# Patient Record
Sex: Female | Born: 1995 | Race: White | Hispanic: No | Smoking: Former smoker
Health system: Southern US, Community
[De-identification: ages and names within clinical notes are randomized; demographics above are authoritative.]

## PROBLEM LIST (undated history)

## (undated) ENCOUNTER — Inpatient Hospital Stay (HOSPITAL_COMMUNITY): Payer: Self-pay

## (undated) DIAGNOSIS — F329 Major depressive disorder, single episode, unspecified: Secondary | ICD-10-CM

## (undated) DIAGNOSIS — F419 Anxiety disorder, unspecified: Secondary | ICD-10-CM

## (undated) DIAGNOSIS — F32A Depression, unspecified: Secondary | ICD-10-CM

## (undated) DIAGNOSIS — F4310 Post-traumatic stress disorder, unspecified: Secondary | ICD-10-CM

## (undated) DIAGNOSIS — R87629 Unspecified abnormal cytological findings in specimens from vagina: Secondary | ICD-10-CM

## (undated) DIAGNOSIS — D649 Anemia, unspecified: Secondary | ICD-10-CM

## (undated) DIAGNOSIS — R519 Headache, unspecified: Secondary | ICD-10-CM

## (undated) DIAGNOSIS — B999 Unspecified infectious disease: Secondary | ICD-10-CM

## (undated) HISTORY — PX: DRUG INDUCED ENDOSCOPY: SHX6808

---
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
## 2018-05-02 DIAGNOSIS — Z3046 Encounter for surveillance of implantable subdermal contraceptive: Secondary | ICD-10-CM | POA: Diagnosis not present

## 2018-09-02 ENCOUNTER — Emergency Department (HOSPITAL_COMMUNITY)
Admission: EM | Admit: 2018-09-02 | Discharge: 2018-09-02 | Disposition: A | Discharge status: Home / Self Care | Payer: Self-pay | Attending: Emergency Medicine | Admitting: Emergency Medicine

## 2018-09-02 ENCOUNTER — Encounter (HOSPITAL_COMMUNITY): Payer: Self-pay | Admitting: *Deleted

## 2018-09-02 DIAGNOSIS — R0981 Nasal congestion: Secondary | ICD-10-CM | POA: Insufficient documentation

## 2018-09-02 DIAGNOSIS — N12 Tubulo-interstitial nephritis, not specified as acute or chronic: Secondary | ICD-10-CM | POA: Insufficient documentation

## 2018-09-02 DIAGNOSIS — L509 Urticaria, unspecified: Secondary | ICD-10-CM | POA: Insufficient documentation

## 2018-09-02 DIAGNOSIS — Z91040 Latex allergy status: Secondary | ICD-10-CM | POA: Insufficient documentation

## 2018-09-02 DIAGNOSIS — R42 Dizziness and giddiness: Secondary | ICD-10-CM | POA: Insufficient documentation

## 2018-09-02 DIAGNOSIS — N912 Amenorrhea, unspecified: Secondary | ICD-10-CM | POA: Insufficient documentation

## 2018-09-02 DIAGNOSIS — J029 Acute pharyngitis, unspecified: Secondary | ICD-10-CM | POA: Insufficient documentation

## 2018-09-02 DIAGNOSIS — R05 Cough: Secondary | ICD-10-CM | POA: Insufficient documentation

## 2018-09-02 LAB — URINALYSIS, ROUTINE W REFLEX MICROSCOPIC
Bilirubin Urine: NEGATIVE
Glucose, UA: NEGATIVE mg/dL
Ketones, ur: 80 mg/dL — AB
Nitrite: POSITIVE — AB
Protein, ur: 30 mg/dL — AB
Specific Gravity, Urine: 1.017 (ref 1.005–1.030)
WBC, UA: >50 WBC/hpf — ABNORMAL HIGH (ref 0–5)
pH: 5 (ref 5.0–8.0)

## 2018-09-02 LAB — COMPREHENSIVE METABOLIC PANEL
ALT: 18 U/L (ref 0–44)
AST: 19 U/L (ref 15–41)
Albumin: 3.5 g/dL (ref 3.5–5.0)
Alkaline Phosphatase: 46 U/L (ref 38–126)
Anion gap: 13 (ref 5–15)
BUN: 11 mg/dL (ref 6–20)
CALCIUM: 8.6 mg/dL — AB (ref 8.9–10.3)
CO2: 23 mmol/L (ref 22–32)
Chloride: 98 mmol/L (ref 98–111)
Creatinine, Ser: 0.93 mg/dL (ref 0.44–1.00)
GFR calc Af Amer: >60 mL/min (ref >60)
Glucose, Bld: 98 mg/dL (ref 70–99)
Potassium: 3.4 mmol/L — ABNORMAL LOW (ref 3.5–5.1)
Sodium: 134 mmol/L — ABNORMAL LOW (ref 135–145)
Total Bilirubin: 0.5 mg/dL (ref 0.3–1.2)
Total Protein: 7.2 g/dL (ref 6.5–8.1)

## 2018-09-02 LAB — CBC WITH DIFFERENTIAL/PLATELET
Abs Immature Granulocytes: 0.06 10*3/uL (ref 0.00–0.07)
Basophils Absolute: 0 10*3/uL (ref 0.0–0.1)
Basophils Relative: 0 %
Eosinophils Absolute: 0 10*3/uL (ref 0.0–0.5)
Eosinophils Relative: 0 %
HEMATOCRIT: 40.8 % (ref 36.0–46.0)
HEMOGLOBIN: 13.1 g/dL (ref 12.0–15.0)
Immature Granulocytes: 1 %
LYMPHS ABS: 0.8 10*3/uL (ref 0.7–4.0)
LYMPHS PCT: 8 %
MCH: 27.1 pg (ref 26.0–34.0)
MCHC: 32.1 g/dL (ref 30.0–36.0)
MCV: 84.3 fL (ref 80.0–100.0)
MONOS PCT: 13 %
Monocytes Absolute: 1.3 10*3/uL — ABNORMAL HIGH (ref 0.1–1.0)
Neutro Abs: 7.9 10*3/uL — ABNORMAL HIGH (ref 1.7–7.7)
Neutrophils Relative %: 78 %
Platelets: 223 10*3/uL (ref 150–400)
RBC: 4.84 MIL/uL (ref 3.87–5.11)
RDW: 13.4 % (ref 11.5–15.5)
WBC: 10.1 10*3/uL (ref 4.0–10.5)
nRBC: 0 % (ref 0.0–0.2)

## 2018-09-02 LAB — GROUP A STREP BY PCR: Group A Strep by PCR: NOT DETECTED

## 2018-09-02 LAB — I-STAT BETA HCG BLOOD, ED (MC, WL, AP ONLY): I-stat hCG, quantitative: <5 m[IU]/mL (ref <5)

## 2018-09-02 MED ORDER — ONDANSETRON HCL 4 MG/2ML IJ SOLN
4.0000 mg | Freq: Once | INTRAMUSCULAR | Status: AC
  Administered 2018-09-02: 4 mg via INTRAVENOUS
  Filled 2018-09-02: qty 2

## 2018-09-02 MED ORDER — ACETAMINOPHEN 325 MG PO TABS
650.0000 mg | ORAL_TABLET | Freq: Once | ORAL | Status: AC
  Administered 2018-09-02: 650 mg via ORAL
  Filled 2018-09-02: qty 2

## 2018-09-02 MED ORDER — CIPROFLOXACIN HCL 500 MG PO TABS: 500.0000 mg | ORAL_TABLET | Freq: Two times a day (BID) | ORAL | 0 refills | Status: DC

## 2018-09-02 MED ORDER — DIPHENHYDRAMINE HCL 50 MG/ML IJ SOLN
INTRAMUSCULAR | Status: AC
  Filled 2018-09-02: qty 1

## 2018-09-02 MED ORDER — ONDANSETRON HCL 4 MG PO TABS: 4.0000 mg | ORAL_TABLET | Freq: Four times a day (QID) | ORAL | 0 refills | Status: DC

## 2018-09-02 MED ORDER — SODIUM CHLORIDE 0.9 % IV SOLN
1.0000 g | Freq: Once | INTRAVENOUS | Status: AC
  Administered 2018-09-02: 1 g via INTRAVENOUS
  Filled 2018-09-02: qty 10

## 2018-09-02 MED ORDER — SODIUM CHLORIDE 0.9 % IV BOLUS
1000.0000 mL | Freq: Once | INTRAVENOUS | Status: AC
  Administered 2018-09-02: 1000 mL via INTRAVENOUS

## 2018-09-02 MED ORDER — FLUCONAZOLE 200 MG PO TABS: 200.0000 mg | ORAL_TABLET | Freq: Every day | ORAL | 0 refills | Status: AC

## 2018-09-02 MED ORDER — CIPROFLOXACIN HCL 500 MG PO TABS: 500.0000 mg | ORAL_TABLET | Freq: Two times a day (BID) | ORAL | 0 refills | Status: AC

## 2018-09-02 MED ORDER — DIPHENHYDRAMINE HCL 50 MG/ML IJ SOLN
25.0000 mg | Freq: Once | INTRAMUSCULAR | Status: AC
  Administered 2018-09-02: 25 mg via INTRAVENOUS

## 2018-09-02 MED ORDER — CIPROFLOXACIN IN D5W 400 MG/200ML IV SOLN
400.0000 mg | Freq: Once | INTRAVENOUS | Status: AC
  Administered 2018-09-02: 400 mg via INTRAVENOUS
  Filled 2018-09-02: qty 200

## 2018-09-02 NOTE — ED Provider Notes (Signed)
22 year old female received signout from Dr. Anitra LauthPlunkett pending reevaluation after urticarial reaction to Rocephin and administration of IV ciprofloxacin.  Per her HPI:  "This is a new problem. Episode onset: 5 days ago. The problem occurs constantly. The problem has been gradually worsening. The maximum temperature noted was 103 to 104 F. The temperature was taken using an oral thermometer. Associated symptoms include vomiting, congestion, sore throat and muscle aches. Pertinent negatives include no chest pain, no diarrhea and no cough. Associated symptoms comments: Dysuria starting on Friday.  No vaginal discharge but LMP was 2 months ago.  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 She has tried acetaminophen for the symptoms. The treatment provided no relief.    Physical Exam  BP 100/64   Pulse 98   Temp (!) 100.9 F (38.3 C) (Oral)   Resp 16   SpO2 98%   Physical Exam  No acute distress. Ambulating without difficulty. No rash.  ED Course/Procedures     Procedures  MDM   22 year old female received a signout from Dr. Anitra LauthPlunkett pending reevaluation after urticarial rash after receiving Rocephin for pyelonephritis..  Benadryl given.  On reevaluation, she denies shortness of breath, throat closing, tongue or lip swelling.  Rash has resolved.  She just finished IV administration of ciprofloxacin.  Will discharge to home with oral course of ciprofloxacin and Zofran for nausea.  Strict return precautions given.  She is hemodynamically stable and in no acute distress.  She is safe for discharge to home with outpatient follow-up at this time.    Frederik PearMcDonald, [employer], PA-C 09/02/18 2354    Melene PlanFloyd, Dan, DO 09/02/18 2357

## 2018-09-02 NOTE — ED Notes (Signed)
Pt ambulated to bathroom without difficulty.

## 2018-09-02 NOTE — ED Provider Notes (Signed)
[facility] EMERGENCY DEPARTMENT Provider Note   CSN: [id number] Arrival date & time: 09/02/18  1529     History   Chief Complaint Chief Complaint  Patient presents with  . Fever    HPI Danielle Carter is a 22 y.o. female.  The history is provided by the patient.  Fever   This is a new problem. Episode onset: 5 days ago. The problem occurs constantly. The problem has been gradually worsening. The maximum temperature noted was 103 to 104 F. The temperature was taken using an oral thermometer. Associated symptoms include vomiting, congestion, sore throat and muscle aches. Pertinent negatives include no chest pain, no diarrhea and no cough. Associated symptoms comments: Dysuria starting on Friday.  No vaginal discharge but LMP was 2 months ago.  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 She has tried acetaminophen for the symptoms. The treatment provided no relief.    History reviewed. No pertinent past medical history.  There are no active problems to display for this patient.      OB History   None      Home Medications    Prior to Admission medications   Medication Sig Start Date End Date Taking? Authorizing Provider  fluconazole (DIFLUCAN) 200 MG tablet Take 1 tablet (200 mg total) by mouth daily for 3 days. Start taking if you start developing yeast infection symptoms 09/02/18 09/05/18  Gwyneth Sprout, MD  ondansetron (ZOFRAN) 4 MG tablet Take 1 tablet (4 mg total) by mouth every 6 (six) hours. 09/02/18   Gwyneth Sprout, MD    Family History History reviewed. No pertinent family history.  Social History Social History   Tobacco Use  . Smoking status: Not on file  Substance Use Topics  . Alcohol use: Not on file  . Drug use: Not on file     Allergies   Abilify [aripiprazole]; Latex; and Pristiq [desvenlafaxine]   Review of Systems Review of Systems  Constitutional: Positive for  activity change and fever.  HENT: Positive for congestion and sore throat.   Respiratory: Negative for cough.   Cardiovascular: Negative for chest pain.  Gastrointestinal: Positive for vomiting. Negative for diarrhea.  All other systems reviewed and are negative.    Physical Exam Updated Vital Signs BP 122/65 (BP Location: Right Arm)   Pulse (!) 128   Temp (!) 100.9 F (38.3 C) (Oral)   Resp 16   SpO2 98%   Physical Exam  Constitutional: She is oriented to person, place, and time. She appears well-developed and well-nourished. No distress.  HENT:  Head: Normocephalic and atraumatic.  Right Ear: Tympanic membrane and ear canal normal.  Left Ear: Tympanic membrane and ear canal normal.  Nose: Mucosal edema present.  Mouth/Throat: Mucous membranes are dry. Posterior oropharyngeal erythema present. No oropharyngeal exudate. No tonsillar exudate.  Eyes: Pupils are equal, round, and reactive to light. Conjunctivae and EOM are normal.  Neck: Normal range of motion. Neck supple.  Cardiovascular: Regular rhythm and intact distal pulses. Tachycardia present.  No murmur heard. Pulmonary/Chest: Effort normal and breath sounds normal. No respiratory distress. She has no wheezes. She has no rales.  Abdominal: Soft. She exhibits no distension. There is no tenderness. There is no rebound and no guarding.  Musculoskeletal: Normal range of motion. She exhibits no edema or tenderness.  Neurological: She is alert and oriented to person, place, and time.  Skin: Skin is warm and  dry. No rash noted. No erythema.  Psychiatric: She has a normal mood and affect. Her behavior is normal.  Nursing note and vitals reviewed.    ED Treatments / Results  Labs (all labs ordered are listed, but only abnormal results are displayed) Labs Reviewed  CBC WITH DIFFERENTIAL/PLATELET - Abnormal; Notable for the following components:      Result Value   Neutro Abs 7.9 (*)    Monocytes Absolute 1.3 (*)    All  other components within normal limits  COMPREHENSIVE METABOLIC PANEL - Abnormal; Notable for the following components:   Sodium 134 (*)    Potassium 3.4 (*)    Calcium 8.6 (*)    All other components within normal limits  URINALYSIS, ROUTINE W REFLEX MICROSCOPIC - Abnormal; Notable for the following components:   APPearance HAZY (*)    Hgb urine dipstick MODERATE (*)    Ketones, ur 80 (*)    Protein, ur 30 (*)    Nitrite POSITIVE (*)    Leukocytes, UA LARGE (*)    WBC, UA >50 (*)    Bacteria, UA FEW (*)    All other components within normal limits  GROUP A STREP BY PCR  URINE CULTURE  I-STAT BETA HCG BLOOD, ED ([facility], [facility], [facility] ONLY)    EKG None  Radiology No results found.  Procedures Procedures (including critical care time)  Medications Ordered in ED Medications  sodium chloride 0.9 % bolus 1,000 mL (1,000 mLs Intravenous New Bag/Given 09/02/18 1746)  ondansetron (ZOFRAN) injection 4 mg (has no administration in time range)  acetaminophen (TYLENOL) tablet 650 mg (650 mg Oral Given 09/02/18 1738)     Initial Impression / Assessment and Plan / ED Course  I have reviewed the triage vital signs and the nursing notes.  Pertinent labs & imaging results that were available during my care of the patient were reviewed by me and considered in my medical decision making (see chart for details).     Patient is a 22 year old healthy female presenting today with 5 days of ongoing fever, sore throat, vomiting daily 1-3 episodes, anorexia, dysuria and a missed menses.  Patient is tachycardic and febrile on exam.  She has pharyngeal erythema but no notable exudates.  She does appear dehydrated but has no localized abdominal pain concerning for appendicitis, cholecystitis or pancreatitis.  Concern for possible UTI versus flu versus strep pharyngitis.  Also patient has missed 1 of her menses and will do a urine pregnancy. Patient given IV fluids and Zofran.  CBC, CMP, UA, hCG, strep screen  pending  6:42 PM hCG is negative, UA with evidence concerning for UTI with nitrite and leukocyte positive and greater than 50 white blood cells.  Rapid strep is negative.  Suspect patient most likely has pyelonephritis.  CBC and CMP are still pending.  Patient had a urine culture ordered and IV Rocephin was given.    6:50 PM CBC and CMp wnl.  Pt had allergic reaction to rocephin and abx was stopped and pt was given benadryl.  Will give cipro instead.  Final Clinical Impressions(s) / ED Diagnoses   Final diagnoses:  Pyelonephritis    ED Discharge Orders         Ordered    fluconazole (DIFLUCAN) 200 MG tablet  Daily     09/02/18 1856    ondansetron (ZOFRAN) 4 MG tablet  Every 6 hours     09/02/18 1856           Ralphael Southgate, [place],  MD 09/02/18 1901

## 2018-09-02 NOTE — ED Notes (Addendum)
Pt noticed hives breaking out on stomach, arms and upper torso 17 minutes after Rocephin was given. Complained of dizziness and coughing. Cricket, RN discontinued IV Rocephin.

## 2018-09-02 NOTE — ED Notes (Signed)
Pt will call when able to give urine sample

## 2018-09-02 NOTE — ED Notes (Signed)
Pt informed to provide more urine for urine culture. Pt verbalized understanding / given UA cup.

## 2018-09-02 NOTE — ED Notes (Signed)
Pt stable, ambulatory, states understanding of discharge instructions 

## 2018-09-02 NOTE — ED Triage Notes (Signed)
Pt in c/o n/v and body aches with fever for the last 5 days, no distress noted, has been taking tylenol cold and flu without relief

## 2018-09-02 NOTE — Discharge Instructions (Addendum)
Take tylenol as needed for fever and drink plenty of fluids.  You can take 650 mg every 6 hours.   Take one tablet of ciprofloxacin morning and night for the next 7 days. Do not stop taking this medication even if your symptoms significantly improve until you have finished the entire course.  This medication as well as fluconazole and some Zofran for nausea and vomiting have been called into your pharmacy.  You can take 1 tablet of Zofran every 6 hours as needed for nausea and vomiting.  Return to the emergency department if you develop a high fever despite taking Tylenol every 6 hours, significantly worsening abdominal pain after you been on antibiotics for at least 2 days, persistent vomiting, or other new, concerning symptoms.

## 2018-09-02 NOTE — ED Notes (Signed)
Pt's family at nurses station states pt is itching and has red spots.  Dr Anitra LauthPlunkett notified.

## 2018-09-04 LAB — URINE CULTURE: Culture: >=100000 — AB

## 2018-09-05 ENCOUNTER — Telehealth: Payer: Self-pay | Admitting: *Deleted

## 2018-09-05 NOTE — Telephone Encounter (Signed)
Post ED Visit - Positive Culture Follow-up  Culture report reviewed by antimicrobial stewardship pharmacist:  [] Nathan Batchelder, Pharm.D. [] Jeremy Frens, Pharm.D., BCPS AQ-ID [] Mike Maccia, Pharm.D., BCPS [] Elizabeth Martin, Pharm.D., BCPS [] Minh Pham, Pharm.D., BCPS, AAHIVP [] Michelle Turner, Pharm.D., BCPS, AAHIVP [] Rachel Rumbarger, PharmD, BCPS [] Thuy Dang, PharmD, BCPS [x] Alison Masters, PharmD, BCPS [] Erin Deja, PharmD  Positive urine culture Treated with Ciprofloxacin HCL, organism sensitive to the same and no further patient follow-up is required at this time.  Danielle Carter 09/05/2018, 10:26 AM   

## 2018-10-02 NOTE — L&D Delivery Note (Addendum)
[facility] Delivery Note  Danielle Carter is a 23 y.o. G2P0101 s/p VD at [redacted]w[redacted]d. She was admitted for SROM.   ROM: 16h 87m with clear fluid GBS Status: Positive, PCN given    Maximum Maternal Temperature:  Temp (24hrs), Avg:98.1 F (36.7 C), Min:98 F (36.7 C), Max:98.3 F (36.8 C)    Labor Progress: . Patient arrived at 1.5 cm dilation and was induced with Cytotec, Foley bulb, Pitocin.   Delivery Date/Time: 08/13/2019 at 1540 Delivery: Called to room and patient was complete and pushing. Head delivered in LOA position. No nuchal cord present.  Infant had a compound shoulder dystocia.  Patient was placed in McRoberts position and suprapubic pressure was applied.  Dr. Juleen China stepped in and was able to sweep the posterior arm and deliver posterior shoulder followed by anterior shoulder.  Shoulder dystocia lasted approximately 1 minute. Infant with spontaneous cry, placed on mother's abdomen, dried and stimulated. Cord clamped x 2 after 1-minute delay, and cut by father of the baby under my direct supervision. Cord blood drawn. Placenta delivered spontaneously with gentle cord traction. Fundus firm with massage and Pitocin. Labia, perineum, vagina, and cervix inspected with right labial tear which was repaired with 3-0 Vicryl sutures.   Placenta: Spontaneous, intact, three-vessel cord Complications: Shoulder dystocia Lacerations: Right labial repaired with 3-0 Vicryl EBL: 57 mL Analgesia: Epidural in place  Infant: APGAR (1 MIN): 8   APGAR (5 MINS): 9    Weight: Pending  Gifford Shave, MD  PGY-1, [facility]  08/13/2019 4:04 PM   OB FELLOW DELIVERY ATTESTATION  I was gloved and present for the delivery in its entirety, and I agree with the above resident's note.    Phill Myron, D.O. OB Fellow  08/14/2019, 9:36 AM

## 2018-12-25 DIAGNOSIS — Z3040 Encounter for surveillance of contraceptives, unspecified: Secondary | ICD-10-CM | POA: Diagnosis not present

## 2019-01-01 ENCOUNTER — Telehealth: Payer: Self-pay

## 2019-01-01 NOTE — Telephone Encounter (Signed)
Pt called reporting she is experiencing morning sickness. I gave the pt recommendations of Vit B6 100mg  once in morning and once in evening. Recommended patient eat small meals every 2-3 hours and keep something like crackers near the bed to eat before she gets up in the morning. Also recommended patient start taking her prenatal vitamin at night or stop taking them for a few days to see if this helps, then switch to a different brand of prenatal vitamin when she resumes taking them to see if that makes a difference. Pt reports she is able to keep fluids and some food down and does not have any symptoms of dehydration at this time. I advised pt that if she feels n/v is getting worse and she is getting dehydrated to go to the hospital to be evaluated. Pt verbalized understanding.

## 2019-01-02 ENCOUNTER — Inpatient Hospital Stay (HOSPITAL_COMMUNITY): Payer: Medicaid Other

## 2019-01-02 ENCOUNTER — Other Ambulatory Visit: Payer: Self-pay

## 2019-01-02 ENCOUNTER — Inpatient Hospital Stay (HOSPITAL_COMMUNITY)
Admission: AD | Admit: 2019-01-02 | Discharge: 2019-01-02 | Disposition: A | Discharge status: Home / Self Care | Payer: Medicaid Other | Attending: Obstetrics & Gynecology | Admitting: Obstetrics & Gynecology

## 2019-01-02 ENCOUNTER — Encounter (HOSPITAL_COMMUNITY): Payer: Self-pay | Admitting: *Deleted

## 2019-01-02 ENCOUNTER — Ambulatory Visit: Payer: Self-pay

## 2019-01-02 DIAGNOSIS — Z3A01 Less than 8 weeks gestation of pregnancy: Secondary | ICD-10-CM | POA: Diagnosis not present

## 2019-01-02 DIAGNOSIS — Z79899 Other long term (current) drug therapy: Secondary | ICD-10-CM | POA: Insufficient documentation

## 2019-01-02 DIAGNOSIS — Z87891 Personal history of nicotine dependence: Secondary | ICD-10-CM | POA: Diagnosis not present

## 2019-01-02 DIAGNOSIS — O219 Vomiting of pregnancy, unspecified: Secondary | ICD-10-CM

## 2019-01-02 DIAGNOSIS — R109 Unspecified abdominal pain: Secondary | ICD-10-CM

## 2019-01-02 DIAGNOSIS — E860 Dehydration: Secondary | ICD-10-CM | POA: Diagnosis not present

## 2019-01-02 DIAGNOSIS — O99281 Endocrine, nutritional and metabolic diseases complicating pregnancy, first trimester: Secondary | ICD-10-CM | POA: Insufficient documentation

## 2019-01-02 DIAGNOSIS — O26891 Other specified pregnancy related conditions, first trimester: Secondary | ICD-10-CM | POA: Insufficient documentation

## 2019-01-02 DIAGNOSIS — O26899 Other specified pregnancy related conditions, unspecified trimester: Secondary | ICD-10-CM

## 2019-01-02 HISTORY — DX: Major depressive disorder, single episode, unspecified: F32.9

## 2019-01-02 HISTORY — DX: Anxiety disorder, unspecified: F41.9

## 2019-01-02 HISTORY — DX: Post-traumatic stress disorder, unspecified: F43.10

## 2019-01-02 HISTORY — DX: Depression, unspecified: F32.A

## 2019-01-02 HISTORY — DX: Anemia, unspecified: D64.9

## 2019-01-02 LAB — WET PREP, GENITAL
Sperm: NONE SEEN
Trich, Wet Prep: NONE SEEN
Yeast Wet Prep HPF POC: NONE SEEN

## 2019-01-02 LAB — URINALYSIS, ROUTINE W REFLEX MICROSCOPIC
Bilirubin Urine: NEGATIVE
Glucose, UA: NEGATIVE mg/dL
Hgb urine dipstick: NEGATIVE
Ketones, ur: 20 mg/dL — AB
Leukocytes,Ua: NEGATIVE
Nitrite: NEGATIVE
Protein, ur: NEGATIVE mg/dL
Specific Gravity, Urine: 1.025 (ref 1.005–1.030)
pH: 7 (ref 5.0–8.0)

## 2019-01-02 LAB — COMPREHENSIVE METABOLIC PANEL
ALT: 9 U/L (ref 0–44)
AST: 16 U/L (ref 15–41)
Albumin: 4.3 g/dL (ref 3.5–5.0)
Alkaline Phosphatase: 42 U/L (ref 38–126)
Anion gap: 10 (ref 5–15)
BUN: 7 mg/dL (ref 6–20)
CO2: 23 mmol/L (ref 22–32)
Calcium: 9.5 mg/dL (ref 8.9–10.3)
Chloride: 104 mmol/L (ref 98–111)
Creatinine, Ser: 0.7 mg/dL (ref 0.44–1.00)
GFR calc Af Amer: >60 mL/min (ref >60)
GFR calc non Af Amer: >60 mL/min (ref >60)
Glucose, Bld: 83 mg/dL (ref 70–99)
Potassium: 3.7 mmol/L (ref 3.5–5.1)
Sodium: 137 mmol/L (ref 135–145)
Total Bilirubin: 0.6 mg/dL (ref 0.3–1.2)
Total Protein: 7 g/dL (ref 6.5–8.1)

## 2019-01-02 LAB — CBC
HCT: 39.9 % (ref 36.0–46.0)
Hemoglobin: 13.4 g/dL (ref 12.0–15.0)
MCH: 28.2 pg (ref 26.0–34.0)
MCHC: 33.6 g/dL (ref 30.0–36.0)
MCV: 84 fL (ref 80.0–100.0)
Platelets: 244 10*3/uL (ref 150–400)
RBC: 4.75 MIL/uL (ref 3.87–5.11)
RDW: 12.5 % (ref 11.5–15.5)
WBC: 7.3 10*3/uL (ref 4.0–10.5)
nRBC: 0 % (ref 0.0–0.2)

## 2019-01-02 LAB — POCT PREGNANCY, URINE: Preg Test, Ur: POSITIVE — AB

## 2019-01-02 LAB — HCG, QUANTITATIVE, PREGNANCY: hCG, Beta Chain, Quant, S: 76813 m[IU]/mL — ABNORMAL HIGH (ref <5)

## 2019-01-02 MED ORDER — PROMETHAZINE HCL 25 MG/ML IJ SOLN
25.0000 mg | Freq: Four times a day (QID) | INTRAMUSCULAR | Status: DC | PRN
  Administered 2019-01-02: 19:00:00 25 mg via INTRAVENOUS
  Filled 2019-01-02: qty 1

## 2019-01-02 MED ORDER — PROMETHAZINE HCL 25 MG PO TABS: 25.0000 mg | ORAL_TABLET | Freq: Four times a day (QID) | ORAL | 2 refills | Status: DC | PRN

## 2019-01-02 MED ORDER — M.V.I. ADULT IV INJ
Freq: Once | INTRAVENOUS | Status: AC
  Administered 2019-01-02: 21:00:00 via INTRAVENOUS
  Filled 2019-01-02: qty 10

## 2019-01-02 MED ORDER — LACTATED RINGERS IV BOLUS
1000.0000 mL | Freq: Once | INTRAVENOUS | Status: AC
  Administered 2019-01-02: 1000 mL via INTRAVENOUS

## 2019-01-02 MED ORDER — FAMOTIDINE 20 MG IN NS 100 ML IVPB
20.0000 mg | Freq: Once | INTRAVENOUS | Status: AC
  Administered 2019-01-02: 20 mg via INTRAVENOUS
  Filled 2019-01-02: qty 100

## 2019-01-02 MED ORDER — SCOPOLAMINE 1 MG/3DAYS TD PT72
1.0000 | MEDICATED_PATCH | TRANSDERMAL | Status: DC
  Administered 2019-01-02: 19:00:00 1.5 mg via TRANSDERMAL
  Filled 2019-01-02: qty 1

## 2019-01-02 NOTE — Telephone Encounter (Signed)
Incoming call from Patient. With a complaint of nausea and vomiting and diarrhea.  Patient is calling to find out what is to much vomiting.Patient states she is vomiting 5 to 6 times a day.   Onset of the vomiting started about 3 weeks ago.  . Patient was able to keep down a popcicle. Water, and 5 crackers.  Patient states that she also wears sea bands.  Last time Patient voided was this am.  Reports that she is starting  to feel light headed.  Patient  Is 6 weeks and 5 days.  EDD is 08/22/2019. Patient Reports loose stools.   Reason for Disposition . [1] Drinking very little AND [2] dehydration suspected (e.g., no urine > 12 hours, very dry mouth, very lightheaded)  Answer Assessment - Initial Assessment Questions 1. VOMITING SEVERITY: "How many times have you vomited  in the past 24 hours?"     - MILD: 1 - 2 times/day    - MODERATE:  3 - 7 times/day    - SEVERE:  8 or more times/day, vomits everything or nearly everything, weight loss      5 to 6 2. ONSET: "When did the vomiting begin?"      About a week ago3. FLUIDS: "What fluids or food have you vomited up today?" "Are you able to keep any liquids down?"     Water pop sickle  5 crackers 4. TREATMENT: "What have you been doing so far to treat this?"       Sea bands ,  5. DEHYDRATION: "When was the last time you urinated?" "Are you feeling lightheaded?" "Weight loss?"     This am feel lightheaded.  6. PREGNANCY: "How many weeks pregnant are you?" "How has the pregnancy been going?"     *6 weeks and 5 days 7. EDD: "What date are you expecting to deliver?"     11/212020 8. MEDICATIONS: "What medications are you taking?" (e.g., prenatal vitamins, iron)     Prenatal vitamins 9. OTHER SYMPTOMS: "Do you have any other symptoms?"     diarrrea one stool today loose ,  Onset 3 days ago  Protocols used: PREGNANCY - MORNING SICKNESS (NAUSEA AND VOMITING OF PREGNANCY)-A-AH

## 2019-01-02 NOTE — MAU Provider Note (Addendum)
History     CSN: [id number]  Arrival date and time: 01/02/19 1648   First Provider Initiated Contact with Patient 01/02/19 1755      Chief Complaint  Patient presents with  . Emesis   G2P0101  by LMP presenting with N/V and LAP. Sx started 1 week ago. Reports emesis 4-5 times a day. Cannot tolerate anything po. Reports watery diarrhea x3 days. No sick contacts. No fevers. LAP is mostly central. No VB. No urinary sx.   RN Note: Pt presents to MAU with complaints of vomiting for a week. Cant keep anything down. Confirmed pregnancy at health department. Denies any VB. Mild abdominal cramping  OB History    Gravida  2   Para  1   Term  0   Preterm  1   AB  0   Living  1     SAB      TAB      Ectopic      Multiple      Live Births  1           Past Medical History:  Diagnosis Date  . Anemia   . Anxiety   . Depression   . PTSD (post-traumatic stress disorder)     Past Surgical History:  Procedure Laterality Date  . DRUG INDUCED ENDOSCOPY      Family History  Problem Relation Age of Onset  . Fibromyalgia Mother   . Pancreatitis Father   . Graves' disease Father   . Depression Father     Social History   Tobacco Use  . Smoking status: Former Smoker    Types: Cigarettes    Last attempt to quit: 12/10/2018    Years since quitting: 0.0  . Smokeless tobacco: Never Used  Substance Use Topics  . Alcohol use: Never    Frequency: Never  . Drug use: Never    Allergies:  Allergies  Allergen Reactions  . Abilify [Aripiprazole]   . Latex   . Pristiq [Desvenlafaxine]   . Rocephin [Ceftriaxone Sodium In Dextrose] Hives    Medications Prior to Admission  Medication Sig Dispense Refill Last Dose  . Prenatal Vit-Fe Fumarate-FA (MULTIVITAMIN-PRENATAL) 27-0.8 MG TABS tablet Take 1 tablet by mouth daily at 12 noon.   01/01/2019 at Unknown time  . ondansetron (ZOFRAN) 4 MG tablet Take 1 tablet (4 mg total) by mouth every 6 (six) hours. 12 tablet 0      Review of Systems  Constitutional: Negative for chills and fever.  Gastrointestinal: Positive for abdominal pain, diarrhea, nausea and vomiting.  Genitourinary: Negative for dysuria, frequency, urgency, vaginal bleeding and vaginal discharge.   Physical Exam   Blood pressure 120/79, pulse 67, temperature 98.2 F (36.8 C), resp. rate 16, height  (1.626 m), weight 53.1 kg, last menstrual period 11/16/2018.  Physical Exam  Nursing note and vitals reviewed. Constitutional: She is oriented to person, place, and time. She appears well-developed and well-nourished. No distress.  HENT:  Head: Normocephalic and atraumatic.  Neck: Normal range of motion.  Cardiovascular: Normal rate.  Respiratory: Effort normal. No respiratory distress.  GI: Soft. She exhibits no distension and no mass. There is no abdominal tenderness. There is no rebound and no guarding.  Genitourinary:    Genitourinary Comments: External: no lesions or erythema Vagina: rugated, pink, moist, scant thin white discharge Uterus: non enlarged, anteverted, + tender, no CMT Adnexae: no masses, no tenderness left, no tenderness right Cervix normal, closed    Musculoskeletal: Normal range  of motion.  Neurological: She is alert and oriented to person, place, and time.  Skin: Skin is warm and dry.  Psychiatric: She has a normal mood and affect.   Results for orders placed or performed during the hospital encounter of 01/02/19 (from the past 24 hour(s))  Pregnancy, urine POC     Status: Abnormal   Collection Time: 01/02/19  5:29 PM  Result Value Ref Range   Preg Test, Ur POSITIVE (A) NEGATIVE  Urinalysis, Routine w reflex microscopic     Status: Abnormal   Collection Time: 01/02/19  5:30 PM  Result Value Ref Range   Color, Urine YELLOW YELLOW   APPearance HAZY (A) CLEAR   Specific Gravity, Urine 1.025 1.005 - 1.030   pH 7.0 5.0 - 8.0   Glucose, UA NEGATIVE NEGATIVE mg/dL   Hgb urine dipstick NEGATIVE NEGATIVE    Bilirubin Urine NEGATIVE NEGATIVE   Ketones, ur 20 (A) NEGATIVE mg/dL   Protein, ur NEGATIVE NEGATIVE mg/dL   Nitrite NEGATIVE NEGATIVE   Leukocytes,Ua NEGATIVE NEGATIVE  Wet prep, genital     Status: Abnormal   Collection Time: 01/02/19  6:09 PM  Result Value Ref Range   Yeast Wet Prep HPF POC NONE SEEN NONE SEEN   Trich, Wet Prep NONE SEEN NONE SEEN   Clue Cells Wet Prep HPF POC PRESENT (A) NONE SEEN   WBC, Wet Prep HPF POC MODERATE (A) NONE SEEN   Sperm NONE SEEN   CBC     Status: None   Collection Time: 01/02/19  7:05 PM  Result Value Ref Range   WBC 7.3 4.0 - 10.5 K/uL   RBC 4.75 3.87 - 5.11 MIL/uL   Hemoglobin 13.4 12.0 - 15.0 g/dL   HCT 96.7 59.1 - 63.8 %   MCV 84.0 80.0 - 100.0 fL   MCH 28.2 26.0 - 34.0 pg   MCHC 33.6 30.0 - 36.0 g/dL   RDW 46.6 59.9 - 35.7 %   Platelets 244 150 - 400 K/uL   nRBC 0.0 0.0 - 0.2 %  Comprehensive metabolic panel     Status: None   Collection Time: 01/02/19  7:05 PM  Result Value Ref Range   Sodium 137 135 - 145 mmol/L   Potassium 3.7 3.5 - 5.1 mmol/L   Chloride 104 98 - 111 mmol/L   CO2 23 22 - 32 mmol/L   Glucose, Bld 83 70 - 99 mg/dL   BUN 7 6 - 20 mg/dL   Creatinine, Ser 0.17 0.44 - 1.00 mg/dL   Calcium 9.5 8.9 - 79.3 mg/dL   Total Protein 7.0 6.5 - 8.1 g/dL   Albumin 4.3 3.5 - 5.0 g/dL   AST 16 15 - 41 U/L   ALT 9 0 - 44 U/L   Alkaline Phosphatase 42 38 - 126 U/L   Total Bilirubin 0.6 0.3 - 1.2 mg/dL   GFR calc non Af Amer >60 >60 mL/min   GFR calc Af Amer >60 >60 mL/min   Anion gap 10 5 - 15  hCG, quantitative, pregnancy     Status: Abnormal   Collection Time: 01/02/19  7:05 PM  Result Value Ref Range   hCG, Beta Chain, Quant, S 76,813 (H) <5 mIU/mL   No results found.  MAU Course  Procedures Orders Placed This Encounter  Procedures  . Wet prep, genital    Standing Status:   Standing    Number of Occurrences:   1    Order Specific Question:   Patient  immune status    Answer:   Normal  . US OB LESS THAN 14 WEEKS  WITH OB TRANSVAGINAL    Standing Status:   Standing    Number of Occurrences:   1    Order Specific Question:   Symptom/Reason for Exam    Answer:   Abdominal pain affecting pregnancy [[id number]]  . Urinalysis, Routine w reflex microscopic    Standing Status:   Standing    Number of Occurrences:   1  . CBC    Standing Status:   Standing    Number of Occurrences:   1  . Comprehensive metabolic panel    Standing Status:   Standing    Number of Occurrences:   1  . hCG, quantitative, pregnancy    Standing Status:   Standing    Number of Occurrences:   1  . Pregnancy, urine POC    Standing Status:   Standing    Number of Occurrences:   1   Meds ordered this encounter  Medications  . lactated ringers bolus 1,000 mL  . multivitamins adult (INFUVITE ADULT) 10 mL in lactated ringers 1,000 mL infusion  . promethazine (PHENERGAN) injection 25 mg  . famotidine (PEPCID) IVPB 20 mg in NS 100 mL IVPB  . scopolamine (TRANSDERM-SCOP) 1 MG/3DAYS 1.5 mg   MDM Labs and [place] ordered and reviewed. Feeling better, no further emesis, will po challenge. Transfer of care given to Lorenda Peck, CNM  01/02/2019 8:58 PM    Assessment and Plan   Assumed care  Results for orders placed or performed during the hospital encounter of 01/02/19 (from the past 24 hour(s))  Pregnancy, urine POC     Status: Abnormal   Collection Time: 01/02/19  5:29 PM  Result Value Ref Range   Preg Test, Ur POSITIVE (A) NEGATIVE  Urinalysis, Routine w reflex microscopic     Status: Abnormal   Collection Time: 01/02/19  5:30 PM  Result Value Ref Range   Color, Urine YELLOW YELLOW   APPearance HAZY (A) CLEAR   Specific Gravity, Urine 1.025 1.005 - 1.030   pH 7.0 5.0 - 8.0   Glucose, UA NEGATIVE NEGATIVE mg/dL   Hgb urine dipstick NEGATIVE NEGATIVE   Bilirubin Urine NEGATIVE NEGATIVE   Ketones, ur 20 (A) NEGATIVE mg/dL   Protein, ur NEGATIVE NEGATIVE mg/dL   Nitrite NEGATIVE NEGATIVE   Leukocytes,Ua  NEGATIVE NEGATIVE  Wet prep, genital     Status: Abnormal   Collection Time: 01/02/19  6:09 PM  Result Value Ref Range   Yeast Wet Prep HPF POC NONE SEEN NONE SEEN   Trich, Wet Prep NONE SEEN NONE SEEN   Clue Cells Wet Prep HPF POC PRESENT (A) NONE SEEN   WBC, Wet Prep HPF POC MODERATE (A) NONE SEEN   Sperm NONE SEEN   CBC     Status: None   Collection Time: 01/02/19  7:05 PM  Result Value Ref Range   WBC 7.3 4.0 - 10.5 K/uL   RBC 4.75 3.87 - 5.11 MIL/uL   Hemoglobin 13.4 12.0 - 15.0 g/dL   HCT 19.6 22.2 - 97.9 %   MCV 84.0 80.0 - 100.0 fL   MCH 28.2 26.0 - 34.0 pg   MCHC 33.6 30.0 - 36.0 g/dL   RDW 89.2 11.9 - 41.7 %   Platelets 244 150 - 400 K/uL   nRBC 0.0 0.0 - 0.2 %  Comprehensive metabolic panel     Status: None  Collection Time: 01/02/19  7:05 PM  Result Value Ref Range   Sodium 137 135 - 145 mmol/L   Potassium 3.7 3.5 - 5.1 mmol/L   Chloride 104 98 - 111 mmol/L   CO2 23 22 - 32 mmol/L   Glucose, Bld 83 70 - 99 mg/dL   BUN 7 6 - 20 mg/dL   Creatinine, Ser 9.52 0.44 - 1.00 mg/dL   Calcium 9.5 8.9 - 84.1 mg/dL   Total Protein 7.0 6.5 - 8.1 g/dL   Albumin 4.3 3.5 - 5.0 g/dL   AST 16 15 - 41 U/L   ALT 9 0 - 44 U/L   Alkaline Phosphatase 42 38 - 126 U/L   Total Bilirubin 0.6 0.3 - 1.2 mg/dL   GFR calc non Af Amer >60 >60 mL/min   GFR calc Af Amer >60 >60 mL/min   Anion gap 10 5 - 15  hCG, quantitative, pregnancy     Status: Abnormal   Collection Time: 01/02/19  7:05 PM  Result Value Ref Range   hCG, Beta Chain, Quant, S 76,813 (H) <5 mIU/mL   Felt better after IV hydration Tolerated PO intake STates she feels ready to go home  A:  SIngle intrauterine pregnancy at [redacted]w[redacted]d Pelvic pain in pregnancy Nausea and vomiting Dehydration  P: Discharge home Has Rx for Zofran Rx sent for Phenergan to use at night Follow up with prenatal care List of providers given  Wynelle Bourgeois CNM

## 2019-01-02 NOTE — MAU Note (Signed)
Pt presents to MAU with complaints of vomiting for a week. Cant keep anything down. Confirmed pregnancy at health department. Denies any VB. Mild abdominal cramping

## 2019-01-02 NOTE — Discharge Instructions (Signed)
Morning Sickness  Morning sickness is when a woman feels nauseous during pregnancy. This nauseous feeling may or may not come with vomiting. It often occurs in the morning, but it can be a problem at any time of day. Morning sickness is most common during the first trimester. In some cases, it may continue throughout pregnancy. Although morning sickness is unpleasant, it is usually harmless unless the woman develops severe and continual vomiting (hyperemesis gravidarum), a condition that requires more intense treatment. What are the causes? The exact cause of this condition is not known, but it seems to be related to normal hormonal changes that occur in pregnancy. What increases the risk? You are more likely to develop this condition if:  You experienced nausea or vomiting before your pregnancy.  You had morning sickness during a previous pregnancy.  You are pregnant with more than one baby, such as twins. What are the signs or symptoms? Symptoms of this condition include:  Nausea.  Vomiting. How is this diagnosed? This condition is usually diagnosed based on your signs and symptoms. How is this treated? In many cases, treatment is not needed for this condition. Making some changes to what you eat may help to control symptoms. Your health care provider may also prescribe or recommend:  Vitamin B6 supplements.  Anti-nausea medicines.  Ginger. Follow these instructions at home: Medicines  Take over-the-counter and prescription medicines only as told by your health care provider. Do not use any prescription, over-the-counter, or herbal medicines for morning sickness without first talking with your health care provider.  Taking multivitamins before getting pregnant can prevent or decrease the severity of morning sickness in most women. Eating and drinking  Eat a piece of dry toast or crackers before getting out of bed in the morning.  Eat 5 or 6 small meals a day.  Eat dry and  bland foods, such as rice or a baked potato. Foods that are high in carbohydrates are often helpful.  Avoid greasy, fatty, and spicy foods.  Have someone cook for you if the smell of any food causes nausea and vomiting.  If you feel nauseous after taking prenatal vitamins, take the vitamins at night or with a snack.  Snack on protein foods between meals if you are hungry. Nuts, yogurt, and cheese are good options.  Drink fluids throughout the day.  Try ginger ale made with real ginger, ginger tea made from fresh grated ginger, or ginger candies. General instructions  Do not use any products that contain nicotine or tobacco, such as cigarettes and e-cigarettes. If you need help quitting, ask your health care provider.  Get an air purifier to keep the air in your house free of odors.  Get plenty of fresh air.  Try to avoid odors that trigger your nausea.  Consider trying these methods to help relieve symptoms: ? Wearing an acupressure wristband. These wristbands are often worn for seasickness. ? Acupuncture. Contact a health care provider if:  Your home remedies are not working and you need medicine.  You feel dizzy or light-headed.  You are losing weight. Get help right away if:  You have persistent and uncontrolled nausea and vomiting.  You faint.  You have severe pain in your abdomen. Summary  Morning sickness is when a woman feels nauseous during pregnancy. This nauseous feeling may or may not come with vomiting.  Morning sickness is most common during the first trimester.  It often occurs in the morning, but it can be a problem at  any time of day. °· In many cases, treatment is not needed for this condition. Making some changes to what you eat may help to control symptoms. °This information is not intended to replace advice given to you by your health care provider. Make sure you discuss any questions you have with your health care provider. °Document Released:  11/09/2006 Document Revised: 10/21/2016 Document Reviewed: 10/21/2016 °Elsevier Interactive Patient Education © 2019 Elsevier Inc. ° °[facility]  ° ° °[facility]       Phone: [phone] ° °[facility]   Phone: [phone] ° °[facility] [place]  Phone: [phone] ° °[facility] [place]  Phone: [phone] ° °[facility] [place]  Phone: [phone] ° °[facility]   Phone: [phone] ° °[facility]       Phone: [phone] ° °[facility]    Phone: [phone]  ° °[facility]    Phone: [phone] ° °[facility]    Phone: [phone] ° °[facility]    Phone: [phone] ° °[facility]       Phone: [phone]  ° °[facility]  Phone: [phone] ° °[facility]    Phone: [phone] ° °[facility]   Phone: [phone] ° °[facility]      Phone: [phone] ° °[facility]    Phone: [phone] ° ° °

## 2019-01-03 LAB — GC/CHLAMYDIA PROBE AMP (~~LOC~~) NOT AT ARMC
Chlamydia: NEGATIVE
Neisseria Gonorrhea: NEGATIVE

## 2019-02-03 ENCOUNTER — Encounter: Payer: Medicaid Other | Admitting: Advanced Practice Midwife

## 2019-02-12 ENCOUNTER — Encounter: Payer: Self-pay | Admitting: Certified Nurse Midwife

## 2019-02-12 ENCOUNTER — Other Ambulatory Visit (HOSPITAL_COMMUNITY)
Admission: RE | Admit: 2019-02-12 | Discharge: 2019-02-12 | Disposition: A | Discharge status: Home / Self Care | Payer: Medicaid Other | Source: Ambulatory Visit | Attending: Advanced Practice Midwife | Admitting: Advanced Practice Midwife

## 2019-02-12 ENCOUNTER — Other Ambulatory Visit: Payer: Self-pay

## 2019-02-12 ENCOUNTER — Ambulatory Visit (INDEPENDENT_AMBULATORY_CARE_PROVIDER_SITE_OTHER): Payer: Medicaid Other | Admitting: Certified Nurse Midwife

## 2019-02-12 VITALS — BP 126/74 | HR 77 | Temp 98.8°F | Wt 129.2 lb

## 2019-02-12 DIAGNOSIS — O09219 Supervision of pregnancy with history of pre-term labor, unspecified trimester: Secondary | ICD-10-CM | POA: Insufficient documentation

## 2019-02-12 DIAGNOSIS — O0990 Supervision of high risk pregnancy, unspecified, unspecified trimester: Secondary | ICD-10-CM | POA: Insufficient documentation

## 2019-02-12 DIAGNOSIS — O2341 Unspecified infection of urinary tract in pregnancy, first trimester: Secondary | ICD-10-CM | POA: Diagnosis not present

## 2019-02-12 DIAGNOSIS — O09211 Supervision of pregnancy with history of pre-term labor, first trimester: Secondary | ICD-10-CM

## 2019-02-12 DIAGNOSIS — Z3A12 12 weeks gestation of pregnancy: Secondary | ICD-10-CM

## 2019-02-12 DIAGNOSIS — Z3481 Encounter for supervision of other normal pregnancy, first trimester: Secondary | ICD-10-CM | POA: Diagnosis not present

## 2019-02-12 MED ORDER — BLOOD PRESSURE KIT: PACK | 0 refills | Status: DC

## 2019-02-12 MED ORDER — PRENATE PIXIE 10-0.6-0.4-200 MG PO CAPS: 1.0000 | ORAL_CAPSULE | Freq: Every day | ORAL | 6 refills | Status: DC

## 2019-02-12 NOTE — Patient Instructions (Signed)

## 2019-02-12 NOTE — Progress Notes (Signed)
Pt presents for NOB visit. This is a planned pregnancy and FOB is involved. Pt wants to discuss subchorionic hemorrhage found during  6 wk ultrasound.

## 2019-02-12 NOTE — Progress Notes (Addendum)
History:   Danielle Carter is a 23 y.o. G2P0101 at [redacted]w[redacted]d by LMP being seen today for her first obstetrical visit.  Her obstetrical history is significant for preterm labor and delivery . Patient does intend to breast feed. Pregnancy history fully reviewed.  Patient reports no complaints.      HISTORY: OB History  Gravida Para Term Preterm AB Living  2 1 0 1 0 1  SAB TAB Ectopic Multiple Live Births  0 0 0 0 1    # Outcome Date GA Lbr Len/2nd Weight Sex Delivery Anes PTL Lv  2 Current           1 Preterm 08/21/14 [redacted]w[redacted]d  5 lb 15 oz (2.693 kg) F Vag-Spont EPI  LIV    Has never had prior pap.   Past Medical History:  Diagnosis Date  . Anemia   . Anxiety   . Depression   . PTSD (post-traumatic stress disorder)    Past Surgical History:  Procedure Laterality Date  . DRUG INDUCED ENDOSCOPY     Family History  Problem Relation Age of Onset  . Fibromyalgia Mother   . Pancreatitis Father   . Graves' disease Father   . Depression Father    Social History   Tobacco Use  . Smoking status: Former Smoker    Types: Cigarettes    Last attempt to quit: 12/10/2018    Years since quitting: 0.1  . Smokeless tobacco: Never Used  Substance Use Topics  . Alcohol use: Never    Frequency: Never  . Drug use: Never   Allergies  Allergen Reactions  . Abilify [Aripiprazole]   . Latex   . Pristiq [Desvenlafaxine]   . Rocephin [Ceftriaxone Sodium In Dextrose] Hives   Current Outpatient Medications on File Prior to Visit  Medication Sig Dispense Refill  . ondansetron (ZOFRAN) 4 MG tablet Take 1 tablet (4 mg total) by mouth every 6 (six) hours. (Patient not taking: Reported on 02/12/2019) 12 tablet 0  . promethazine (PHENERGAN) 25 MG tablet Take 1 tablet (25 mg total) by mouth every 6 (six) hours as needed for nausea or vomiting. (Patient not taking: Reported on 02/12/2019) 30 tablet 2   No current facility-administered medications on file prior to visit.     Review of Systems  Pertinent items noted in HPI and remainder of comprehensive ROS otherwise negative. Physical Exam:   Vitals:   02/12/19 1419  BP: 126/74  Pulse: 77  Temp: 98.8 F (37.1 C)  Weight: 129 lb 3.2 oz (58.6 kg)   Fetal Heart Rate (bpm): 164 Pelvic Exam: Perineum: no hemorrhoids, normal perineum   Vulva: normal external genitalia, no lesions   Vagina:  normal mucosa, moderate amount of white thin discharge   Cervix: no lesions and normal, pap smear done.    Adnexa: normal adnexa and no mass, fullness, tenderness   Bony Pelvis: average  System: General: well-developed, well-nourished female in no acute distress   Skin: normal coloration and turgor, no rashes   Neurologic: oriented, normal, negative, normal mood   Extremities: normal strength, tone, and muscle mass, ROM of all joints is normal   HEENT PERRLA, extraocular movement intact and sclera clear   Mouth/Teeth mucous membranes moist, pharynx normal without lesions and dental hygiene good   Neck supple and no masses   Cardiovascular: regular rate and rhythm   Respiratory:  no respiratory distress, normal breath sounds   Abdomen: soft, non-tender; bowel sounds normal; no masses,  no organomegaly  Assessment:    Pregnancy: G2P0101 Patient Active Problem List   Diagnosis Date Noted  . Supervision of high risk pregnancy, antepartum 02/12/2019  . History of preterm labor, current pregnancy 02/12/2019     Plan:    1. Supervision of high risk pregnancy, antepartum - Routine prenatal care  - Anticipatory guidance on visits with in person and webex appointments - Educated and [facility]  - Obstetric Panel, Including HIV - Cervicovaginal ancillary only( [place]) - Cytology - PAP - CHL AMB BABYSCRIPTS SCHEDULE OPTIMIZATION - Culture, OB Urine - Genetic Screening - US MFM OB COMP + 14 WK; Future - Prenat-FeAsp-Meth-FA-DHA w/o A (PRENATE PIXIE)  10-0.6-0.4-200 MG CAPS; Take 1 tablet by mouth daily.  Dispense: 90 capsule; Refill: 6  2. Current pregnancy with history of pre-term labor in first trimester - Patient reports hx of preterm labor with first present around 34 weeks that was "able to be stopped".  - She reports SROM @ 36 weeks with delivery 48 hours due to holding IOL for BMZ injections  - offered 17P based on hx of PRROM at 36 weeks, educated and discussed weekly injections starting at 16 weeks until 36 weeks. Educated on the reason for 17P, patient declined at this time.  Initial labs drawn. Continue prenatal vitamins. Genetic Screening discussed, NIPS: ordered. Ultrasound discussed; fetal anatomic survey: ordered. Problem list reviewed and updated. The nature [facility] with multiple MDs and other Advanced Practice Providers was explained to patient; also emphasized that residents, students are part of our team. Routine obstetric precautions reviewed. Return in about 8 weeks (around 04/09/2019) for ROB.     Sharyon CableVeronica C Leslye Puccini, CNM [facility]

## 2019-02-13 LAB — OBSTETRIC PANEL, INCLUDING HIV
Antibody Screen: NEGATIVE
Basophils Absolute: 0 10*3/uL (ref 0.0–0.2)
Basos: 1 %
EOS (ABSOLUTE): 0 10*3/uL (ref 0.0–0.4)
Eos: 1 %
HIV Screen 4th Generation wRfx: NONREACTIVE
Hematocrit: 37.1 % (ref 34.0–46.6)
Hemoglobin: 13.1 g/dL (ref 11.1–15.9)
Hepatitis B Surface Ag: NEGATIVE
Immature Grans (Abs): 0 10*3/uL (ref 0.0–0.1)
Immature Granulocytes: 1 %
Lymphocytes Absolute: 1.8 10*3/uL (ref 0.7–3.1)
Lymphs: 27 %
MCH: 28.7 pg (ref 26.6–33.0)
MCHC: 35.3 g/dL (ref 31.5–35.7)
MCV: 81 fL (ref 79–97)
Monocytes Absolute: 0.5 10*3/uL (ref 0.1–0.9)
Monocytes: 8 %
Neutrophils Absolute: 4.2 10*3/uL (ref 1.4–7.0)
Neutrophils: 62 %
Platelets: 238 10*3/uL (ref 150–450)
RBC: 4.57 x10E6/uL (ref 3.77–5.28)
RDW: 13.5 % (ref 11.7–15.4)
RPR Ser Ql: NONREACTIVE
Rh Factor: POSITIVE
Rubella Antibodies, IGG: 2.27 index (ref >0.99)
WBC: 6.6 10*3/uL (ref 3.4–10.8)

## 2019-02-14 LAB — CYTOLOGY - PAP

## 2019-02-14 LAB — CERVICOVAGINAL ANCILLARY ONLY
Bacterial vaginitis: NEGATIVE
Candida vaginitis: NEGATIVE
Chlamydia: NEGATIVE
Neisseria Gonorrhea: NEGATIVE
Trichomonas: NEGATIVE

## 2019-02-15 LAB — URINE CULTURE, OB REFLEX

## 2019-02-15 LAB — CULTURE, OB URINE

## 2019-02-15 MED ORDER — AMOXICILLIN-POT CLAVULANATE 500-125 MG PO TABS: 1.0000 | ORAL_TABLET | Freq: Three times a day (TID) | ORAL | 0 refills | Status: AC

## 2019-02-15 NOTE — Addendum Note (Signed)
Addended by: Sharyon Cable on: 02/15/2019 03:36 PM   Modules accepted: Orders

## 2019-02-17 ENCOUNTER — Other Ambulatory Visit: Payer: Self-pay | Admitting: Obstetrics

## 2019-02-17 MED ORDER — BLOOD PRESSURE MONITOR KIT: 1.0000 | PACK | Freq: Every day | 0 refills | Status: DC

## 2019-02-20 ENCOUNTER — Encounter: Payer: Self-pay | Admitting: Certified Nurse Midwife

## 2019-02-26 ENCOUNTER — Encounter: Payer: Self-pay | Admitting: Obstetrics and Gynecology

## 2019-02-26 DIAGNOSIS — R87612 Low grade squamous intraepithelial lesion on cytologic smear of cervix (LGSIL): Secondary | ICD-10-CM | POA: Insufficient documentation

## 2019-02-26 DIAGNOSIS — R8271 Bacteriuria: Secondary | ICD-10-CM | POA: Insufficient documentation

## 2019-02-26 DIAGNOSIS — O99891 Other specified diseases and conditions complicating pregnancy: Secondary | ICD-10-CM | POA: Insufficient documentation

## 2019-03-13 ENCOUNTER — Ambulatory Visit: Payer: Medicaid Other

## 2019-03-18 ENCOUNTER — Ambulatory Visit (INDEPENDENT_AMBULATORY_CARE_PROVIDER_SITE_OTHER): Payer: Medicaid Other

## 2019-03-18 ENCOUNTER — Encounter: Payer: Self-pay | Admitting: Obstetrics

## 2019-03-18 ENCOUNTER — Other Ambulatory Visit: Payer: Self-pay

## 2019-03-18 DIAGNOSIS — O0990 Supervision of high risk pregnancy, unspecified, unspecified trimester: Secondary | ICD-10-CM

## 2019-03-18 NOTE — Progress Notes (Signed)
Late entry: Pt instructed on how to properly operate her bp cuff at home and how to enter the readings into babyscripts.

## 2019-03-26 ENCOUNTER — Ambulatory Visit (HOSPITAL_COMMUNITY)
Admission: RE | Admit: 2019-03-26 | Discharge: 2019-03-26 | Disposition: A | Discharge status: Home / Self Care | Payer: Medicaid Other | Source: Ambulatory Visit | Attending: Obstetrics and Gynecology | Admitting: Obstetrics and Gynecology

## 2019-03-26 ENCOUNTER — Other Ambulatory Visit: Payer: Self-pay | Admitting: Certified Nurse Midwife

## 2019-03-26 ENCOUNTER — Other Ambulatory Visit: Payer: Self-pay

## 2019-03-26 DIAGNOSIS — O09212 Supervision of pregnancy with history of pre-term labor, second trimester: Secondary | ICD-10-CM

## 2019-03-26 DIAGNOSIS — Z363 Encounter for antenatal screening for malformations: Secondary | ICD-10-CM | POA: Diagnosis not present

## 2019-03-26 DIAGNOSIS — O358XX0 Maternal care for other (suspected) fetal abnormality and damage, not applicable or unspecified: Secondary | ICD-10-CM | POA: Diagnosis not present

## 2019-03-26 DIAGNOSIS — Z3A18 18 weeks gestation of pregnancy: Secondary | ICD-10-CM | POA: Diagnosis not present

## 2019-03-26 DIAGNOSIS — O0990 Supervision of high risk pregnancy, unspecified, unspecified trimester: Secondary | ICD-10-CM | POA: Insufficient documentation

## 2019-03-27 ENCOUNTER — Encounter: Payer: Self-pay | Admitting: Certified Nurse Midwife

## 2019-03-27 DIAGNOSIS — O350XX0 Maternal care for (suspected) central nervous system malformation in fetus, not applicable or unspecified: Secondary | ICD-10-CM | POA: Insufficient documentation

## 2019-03-27 DIAGNOSIS — IMO0002 Reserved for concepts with insufficient information to code with codable children: Secondary | ICD-10-CM | POA: Insufficient documentation

## 2019-04-09 ENCOUNTER — Encounter: Payer: Self-pay | Admitting: Obstetrics & Gynecology

## 2019-04-09 ENCOUNTER — Other Ambulatory Visit: Payer: Self-pay

## 2019-04-09 ENCOUNTER — Ambulatory Visit (INDEPENDENT_AMBULATORY_CARE_PROVIDER_SITE_OTHER): Payer: Medicaid Other | Admitting: Obstetrics & Gynecology

## 2019-04-09 DIAGNOSIS — Z3A19 19 weeks gestation of pregnancy: Secondary | ICD-10-CM | POA: Diagnosis not present

## 2019-04-09 DIAGNOSIS — O0990 Supervision of high risk pregnancy, unspecified, unspecified trimester: Secondary | ICD-10-CM

## 2019-04-09 DIAGNOSIS — O0992 Supervision of high risk pregnancy, unspecified, second trimester: Secondary | ICD-10-CM

## 2019-04-09 NOTE — Patient Instructions (Signed)

## 2019-04-09 NOTE — Progress Notes (Signed)
I connected with Danielle Carter on 04/09/19 at  3:30 PM EDT by telephone and verified that I am speaking with the correct person using two identifiers.   No concerns today per pt.

## 2019-04-09 NOTE — Progress Notes (Signed)
   TELEHEALTH VIRTUAL OBSTETRICS VISIT ENCOUNTER NOTE  I connected with Danielle Carter on 04/09/19 at  3:30 PM EDT by telephone at home and verified that I am speaking with the correct person using two identifiers.   I discussed the limitations, risks, security and privacy concerns of performing an evaluation and management service by telephone and the availability of in person appointments. I also discussed with the patient that there may be a patient responsible charge related to this service. The patient expressed understanding and agreed to proceed.  Subjective:  Danielle Carter is a 23 y.o. G2P0101 at [redacted]w[redacted]d being followed for ongoing prenatal care.  She is currently monitored for the following issues for this high-risk pregnancy and has Supervision of high risk pregnancy, antepartum; History of preterm labor, current pregnancy; Asymptomatic bacteriuria during pregnancy; Low grade squamous intraepith lesion on cytologic smear cervix (lgsil); and Choroid plexus cyst of fetus on prenatal ultrasound on their problem list.  Patient reports pelvic pain standing at work. Reports fetal movement. Denies any contractions, bleeding or leaking of fluid.   The following portions of the patient's history were reviewed and updated as appropriate: allergies, current medications, past family history, past medical history, past social history, past surgical history and problem list.   Objective:   General:  Alert, oriented and cooperative.   Mental Status: Normal mood and affect perceived. Normal judgment and thought content.  Rest of physical exam deferred due to type of encounter  Assessment and Plan:  Pregnancy: G2P0101 at [redacted]w[redacted]d 1. Supervision of high risk pregnancy, antepartum H/o PTB at 36 weeks  Preterm labor symptoms and general obstetric precautions including but not limited to vaginal bleeding, contractions, leaking of fluid and fetal movement were reviewed in detail with the patient.  I  discussed the assessment and treatment plan with the patient. The patient was provided an opportunity to ask questions and all were answered. The patient agreed with the plan and demonstrated an understanding of the instructions. The patient was advised to call back or seek an in-person office evaluation/go to [facility]U at [facility] for any urgent or concerning symptoms. Please refer to After Visit Summary for other counseling recommendations.   I provided 12 minutes of non-face-to-face time during this encounter.  Return in about 8 weeks (around 06/04/2019). Note for work to be able to sit No future appointments.  Emeterio Reeve, MD [facility] for [place], [place]

## 2019-04-23 ENCOUNTER — Other Ambulatory Visit: Payer: Self-pay

## 2019-04-23 ENCOUNTER — Encounter (HOSPITAL_COMMUNITY): Payer: Self-pay

## 2019-04-23 ENCOUNTER — Ambulatory Visit (HOSPITAL_COMMUNITY)
Admission: RE | Admit: 2019-04-23 | Discharge: 2019-04-23 | Disposition: A | Discharge status: Home / Self Care | Payer: Medicaid Other | Source: Ambulatory Visit | Attending: Obstetrics and Gynecology | Admitting: Obstetrics and Gynecology

## 2019-04-23 ENCOUNTER — Ambulatory Visit (HOSPITAL_COMMUNITY): Payer: Medicaid Other | Admitting: *Deleted

## 2019-04-23 DIAGNOSIS — O0990 Supervision of high risk pregnancy, unspecified, unspecified trimester: Secondary | ICD-10-CM | POA: Diagnosis not present

## 2019-04-23 DIAGNOSIS — Z362 Encounter for other antenatal screening follow-up: Secondary | ICD-10-CM

## 2019-04-23 DIAGNOSIS — Z3A22 22 weeks gestation of pregnancy: Secondary | ICD-10-CM

## 2019-04-23 DIAGNOSIS — O350XX0 Maternal care for (suspected) central nervous system malformation in fetus, not applicable or unspecified: Secondary | ICD-10-CM

## 2019-04-23 DIAGNOSIS — O09212 Supervision of pregnancy with history of pre-term labor, second trimester: Secondary | ICD-10-CM

## 2019-04-23 DIAGNOSIS — O358XX0 Maternal care for other (suspected) fetal abnormality and damage, not applicable or unspecified: Secondary | ICD-10-CM

## 2019-04-23 DIAGNOSIS — O09211 Supervision of pregnancy with history of pre-term labor, first trimester: Secondary | ICD-10-CM | POA: Insufficient documentation

## 2019-04-23 DIAGNOSIS — IMO0002 Reserved for concepts with insufficient information to code with codable children: Secondary | ICD-10-CM

## 2019-06-04 ENCOUNTER — Encounter: Payer: Medicaid Other | Admitting: Certified Nurse Midwife

## 2019-06-04 ENCOUNTER — Other Ambulatory Visit: Payer: Medicaid Other

## 2019-06-10 ENCOUNTER — Other Ambulatory Visit: Payer: Self-pay

## 2019-06-10 ENCOUNTER — Other Ambulatory Visit: Payer: Medicaid Other

## 2019-06-10 ENCOUNTER — Ambulatory Visit (INDEPENDENT_AMBULATORY_CARE_PROVIDER_SITE_OTHER): Payer: Medicaid Other | Admitting: Advanced Practice Midwife

## 2019-06-10 VITALS — BP 119/74 | HR 96 | Wt 139.0 lb

## 2019-06-10 DIAGNOSIS — Z23 Encounter for immunization: Secondary | ICD-10-CM | POA: Diagnosis not present

## 2019-06-10 DIAGNOSIS — O09893 Supervision of other high risk pregnancies, third trimester: Secondary | ICD-10-CM

## 2019-06-10 DIAGNOSIS — R8271 Bacteriuria: Secondary | ICD-10-CM | POA: Diagnosis not present

## 2019-06-10 DIAGNOSIS — Z3009 Encounter for other general counseling and advice on contraception: Secondary | ICD-10-CM

## 2019-06-10 DIAGNOSIS — O09213 Supervision of pregnancy with history of pre-term labor, third trimester: Secondary | ICD-10-CM

## 2019-06-10 DIAGNOSIS — O99891 Other specified diseases and conditions complicating pregnancy: Secondary | ICD-10-CM

## 2019-06-10 DIAGNOSIS — O99820 Streptococcus B carrier state complicating pregnancy: Secondary | ICD-10-CM

## 2019-06-10 DIAGNOSIS — Z3A28 28 weeks gestation of pregnancy: Secondary | ICD-10-CM

## 2019-06-10 DIAGNOSIS — O0990 Supervision of high risk pregnancy, unspecified, unspecified trimester: Secondary | ICD-10-CM | POA: Diagnosis not present

## 2019-06-10 DIAGNOSIS — O0993 Supervision of high risk pregnancy, unspecified, third trimester: Secondary | ICD-10-CM

## 2019-06-10 DIAGNOSIS — O9989 Other specified diseases and conditions complicating pregnancy, childbirth and the puerperium: Secondary | ICD-10-CM | POA: Diagnosis not present

## 2019-06-10 NOTE — Progress Notes (Signed)
   PRENATAL VISIT NOTE  Subjective:  Danielle Carter is a 23 y.o. G2P0101 at [redacted]w[redacted]d being seen today for ongoing prenatal care.  She is currently monitored for the following issues for this high-risk pregnancy and has Supervision of high risk pregnancy, antepartum; History of preterm labor, current pregnancy; Asymptomatic bacteriuria during pregnancy; Low grade squamous intraepith lesion on cytologic smear cervix (lgsil); and Choroid plexus cyst of fetus on prenatal ultrasound on their problem list.  Patient reports no complaints.  Contractions: Not present. Vag. Bleeding: None.  Movement: Present. Denies leaking of fluid.   The following portions of the patient's history were reviewed and updated as appropriate: allergies, current medications, past family history, past medical history, past social history, past surgical history and problem list. Problem list updated.  Objective:   Vitals:   06/10/19 0858  BP: 119/74  Pulse: 96  Weight: 139 lb (63 kg)    Fetal Status: Fetal Heart Rate (bpm): 146 Fundal Height: 29 cm Movement: Present     General:  Alert, oriented and cooperative. Patient is in no acute distress.  Skin: Skin is warm and dry. No rash noted.   Cardiovascular: Normal heart rate noted  Respiratory: Normal respiratory effort, no problems with respiration noted  Abdomen: Soft, gravid, appropriate for gestational age.  Pain/Pressure: Absent     Pelvic: Cervical exam deferred        Extremities: Normal range of motion.  Edema: None  Mental Status: Normal mood and affect. Normal behavior. Normal judgment and thought content.   Assessment and Plan:  Pregnancy: G2P0101 at [redacted]w[redacted]d  1. Supervision of high risk pregnancy, antepartum - No complaints or concerns, continue routine care - Discussed kick counts, interventions for low count beginning 29-30 weeks - Reviewed milestones for remaining visits, typical arrangement of virtual and in-person appointments - Glucose Tolerance, 2  Hours w/1 Hour - RPR - CBC - HIV antibody (with reflex) - Flu Vaccine QUAD 36+ mos IM (Fluarix, Quad PF)  2. History of preterm delivery, currently pregnant in third trimester - Hx PTL 34 weeks, PPROM with SVD at 36 weeks - Declined 17-P  3. Asymptomatic bacteriuria during pregnancy - Diagnosed and treated 01/2019, TOC collected today - Culture, OB Urine  4. Encounter for counseling regarding contraception - Undecided. Desires more children. Previous negative experience with Nexplanon - Briefly reviewed opportunity for postplacental IUD placement - Reviewed all forms of birth control options available including abstinence; fertility period awareness methods; over the counter/barrier methods; hormonal contraceptive medication including pill, patch, ring, injection,contraceptive implant; hormonal and nonhormonal IUDs; permanent sterilization options not reviewed due to stated desire for more children. Risks and benefits reviewed.  Questions were answered.  Information was given to patient to review.   Preterm labor symptoms and general obstetric precautions including but not limited to vaginal bleeding, contractions, leaking of fluid and fetal movement were reviewed in detail with the patient. Please refer to After Visit Summary for other counseling recommendations.  Return in about 4 weeks (around 07/08/2019) for Virtual 32 week appointment (patient has home cuff).  Future Appointments  Date Time Provider [place]  07/08/2019 10:30 AM Shelly Bombard, MD [place] None   Mallie Snooks, MSN, CNM Certified Nurse Midwife, [facility] for [facility], [place] [facility] 06/10/19 10:09 AM

## 2019-06-10 NOTE — Patient Instructions (Addendum)
Third Trimester of Pregnancy  The third trimester is from week 28 through week 40 (months 7 through 9). This trimester is when your unborn baby (fetus) is growing very fast. At the end of the ninth month, the unborn baby is about 20 inches in length. It weighs about 6-10 pounds. Follow these instructions at home: Medicines  Take over-the-counter and prescription medicines only as told by your doctor. Some medicines are safe and some medicines are not safe during pregnancy.  Take a prenatal vitamin that contains at least 600 micrograms (mcg) of folic acid.  If you have trouble pooping (constipation), take medicine that will make your stool soft (stool softener) if your doctor approves. Eating and drinking   Eat regular, healthy meals.  Avoid raw meat and uncooked cheese.  If you get low calcium from the food you eat, talk to your doctor about taking a daily calcium supplement.  Eat four or five small meals rather than three large meals a day.  Avoid foods that are high in fat and sugars, such as fried and sweet foods.  To prevent constipation: ? Eat foods that are high in fiber, like fresh fruits and vegetables, whole grains, and beans. ? Drink enough fluids to keep your pee (urine) clear or pale yellow. Activity  Exercise only as told by your doctor. Stop exercising if you start to have cramps.  Avoid heavy lifting, wear low heels, and sit up straight.  Do not exercise if it is too hot, too humid, or if you are in a place of great height (high altitude).  You may continue to have sex unless your doctor tells you not to. Relieving pain and discomfort  Wear a good support bra if your breasts are tender.  Take frequent breaks and rest with your legs raised if you have leg cramps or low back pain.  Take warm water baths (sitz baths) to soothe pain or discomfort caused by hemorrhoids. Use hemorrhoid cream if your doctor approves.  If you develop puffy, bulging veins (varicose  veins) in your legs: ? Wear support hose or compression stockings as told by your doctor. ? Raise (elevate) your feet for 15 minutes, 3-4 times a day. ? Limit salt in your food. Safety  Wear your seat belt when driving.  Make a list of emergency phone numbers, including numbers for family, friends, the hospital, and police and fire departments. Preparing for your baby's arrival To prepare for the arrival of your baby:  Take prenatal classes.  Practice driving to the hospital.  Visit the hospital and tour the maternity area.  Talk to your work about taking leave once the baby comes.  Pack your hospital bag.  Prepare the baby's room.  Go to your doctor visits.  Buy a rear-facing car seat. Learn how to install it in your car. General instructions  Do not use hot tubs, steam rooms, or saunas.  Do not use any products that contain nicotine or tobacco, such as cigarettes and e-cigarettes. If you need help quitting, ask your doctor.  Do not drink alcohol.  Do not douche or use tampons or scented sanitary pads.  Do not cross your legs for long periods of time.  Do not travel for long distances unless you must. Only do so if your doctor says it is okay.  Visit your dentist if you have not gone during your pregnancy. Use a soft toothbrush to brush your teeth. Be gentle when you floss.  Avoid cat litter boxes and soil   used by cats. These carry germs that can cause birth defects in the baby and can cause a loss of your baby (miscarriage) or stillbirth.  Keep all your prenatal visits as told by your doctor. This is important. Contact a doctor if:  You are not sure if you are in labor or if your water has broken.  You are dizzy.  You have mild cramps or pressure in your lower belly.  You have a nagging pain in your belly area.  You continue to feel sick to your stomach, you throw up, or you have watery poop.  You have bad smelling fluid coming from your vagina.  You have  pain when you pee. Get help right away if:  You have a fever.  You are leaking fluid from your vagina.  You are spotting or bleeding from your vagina.  You have severe belly cramps or pain.  You lose or gain weight quickly.  You have trouble catching your breath and have chest pain.  You notice sudden or extreme puffiness (swelling) of your face, hands, ankles, feet, or legs.  You have not felt the baby move in over an hour.  You have severe headaches that do not go away with medicine.  You have trouble seeing.  You are leaking, or you are having a gush of fluid, from your vagina before you are 37 weeks.  You have regular belly spasms (contractions) before you are 37 weeks. Summary  The third trimester is from week 28 through week 40 (months 7 through 9). This time is when your unborn baby is growing very fast.  Follow your doctor's advice about medicine, food, and activity.  Get ready for the arrival of your baby by taking prenatal classes, getting all the baby items ready, preparing the baby's room, and visiting your doctor to be checked.  Get help right away if you are bleeding from your vagina, or you have chest pain and trouble catching your breath, or if you have not felt your baby move in over an hour. This information is not intended to replace advice given to you by your health care provider. Make sure you discuss any questions you have with your health care provider. Document Released: 12/13/2009 Document Revised: 01/09/2019 Document Reviewed: 10/24/2016 Elsevier Patient Education  2020 [place].   Fetal Movement Counts Patient Name: ________________________________________________ Patient Due Date: ____________________ What is a fetal movement count?  A fetal movement count is the number of times that you feel your baby move during a certain amount of time. This may also be called a fetal kick count. A fetal movement count is recommended for every pregnant  woman. You may be asked to start counting fetal movements as early as week 28 of your pregnancy. Pay attention to when your baby is most active. You may notice your baby's sleep and wake cycles. You may also notice things that make your baby move more. You should do a fetal movement count:  When your baby is normally most active.  At the same time each day. A good time to count movements is while you are resting, after having something to eat and drink. How do I count fetal movements? 1. Find a quiet, comfortable area. Sit, or lie down on your side. 2. Write down the date, the start time and stop time, and the number of movements that you felt between those two times. Take this information with you to your health care visits. 3. For 2 hours, count kicks,  rolls, and jabs. You should feel at least 10 movements during 2 hours. 4. You may stop counting after you have felt 10 movements. 5. If you do not feel 10 movements in 2 hours, have something to eat and drink. Then, keep resting and counting for 1 hour. If you feel at least 4 movements during that hour, you may stop counting. Contact a health care provider if:  You feel fewer than 4 movements in 2 hours.  Your baby is not moving like he or she usually does. Date: ____________ Start time: ____________ Stop time: ____________ Movements: ____________ Date: ____________ Start time: ____________ Stop time: ____________ Movements: ____________ Date: ____________ Start time: ____________ Stop time: ____________ Movements: ____________ Date: ____________ Start time: ____________ Stop time: ____________ Movements: ____________ Date: ____________ Start time: ____________ Stop time: ____________ Movements: ____________ Date: ____________ Start time: ____________ Stop time: ____________ Movements: ____________ Date: ____________ Start time: ____________ Stop time: ____________ Movements: ____________ Date: ____________ Start time:  ____________ Stop time: ____________ Movements: ____________ Date: ____________ Start time: ____________ Stop time: ____________ Movements: ____________ This information is not intended to replace advice given to you by your health care provider. Make sure you discuss any questions you have with your health care provider. Document Released: 10/18/2006 Document Revised: 10/08/2018 Document Reviewed: 10/28/2015 Elsevier Patient Education  2020 [employer] Inc[employer]  

## 2019-06-10 NOTE — Progress Notes (Signed)
Patient reports fetal movement, denies pain, provided info about tdap.

## 2019-06-11 LAB — CBC
Hematocrit: 35.7 % (ref 34.0–46.6)
Hemoglobin: 12 g/dL (ref 11.1–15.9)
MCH: 29.2 pg (ref 26.6–33.0)
MCHC: 33.6 g/dL (ref 31.5–35.7)
MCV: 87 fL (ref 79–97)
Platelets: 234 10*3/uL (ref 150–450)
RBC: 4.11 x10E6/uL (ref 3.77–5.28)
RDW: 12.5 % (ref 11.7–15.4)
WBC: 10.1 10*3/uL (ref 3.4–10.8)

## 2019-06-11 LAB — RPR: RPR Ser Ql: NONREACTIVE

## 2019-06-11 LAB — GLUCOSE TOLERANCE, 2 HOURS W/ 1HR
Glucose, 1 hour: 105 mg/dL (ref 65–179)
Glucose, 2 hour: 68 mg/dL (ref 65–152)
Glucose, Fasting: 76 mg/dL (ref 65–91)

## 2019-06-11 LAB — HIV ANTIBODY (ROUTINE TESTING W REFLEX): HIV Screen 4th Generation wRfx: NONREACTIVE

## 2019-06-13 LAB — URINE CULTURE, OB REFLEX

## 2019-06-13 LAB — CULTURE, OB URINE

## 2019-06-14 ENCOUNTER — Other Ambulatory Visit: Payer: Self-pay | Admitting: Advanced Practice Midwife

## 2019-06-14 DIAGNOSIS — O2343 Unspecified infection of urinary tract in pregnancy, third trimester: Secondary | ICD-10-CM

## 2019-06-14 MED ORDER — AMOXICILLIN-POT CLAVULANATE 500-125 MG PO TABS: 1.0000 | ORAL_TABLET | Freq: Three times a day (TID) | ORAL | Status: AC

## 2019-06-14 NOTE — Progress Notes (Signed)
UTI treated May 2020. TOC this week shows e. Coli. Rx to pharmacy. Spoke with patient on the phone to notify of results, discuss rx and confirm she did not have an allergic reaction when previously prescribed.  Mallie Snooks, MSN, CNM Certified Nurse Midwife, [facility] for [employer], [place] [facility] 06/14/19 12:58 PM

## 2019-07-03 ENCOUNTER — Encounter (HOSPITAL_COMMUNITY): Payer: Self-pay

## 2019-07-03 ENCOUNTER — Inpatient Hospital Stay (HOSPITAL_COMMUNITY)
Admission: AD | Admit: 2019-07-03 | Discharge: 2019-07-03 | Disposition: A | Discharge status: Home / Self Care | Payer: Medicaid Other | Attending: Obstetrics & Gynecology | Admitting: Obstetrics & Gynecology

## 2019-07-03 DIAGNOSIS — N898 Other specified noninflammatory disorders of vagina: Secondary | ICD-10-CM | POA: Diagnosis not present

## 2019-07-03 DIAGNOSIS — Z87891 Personal history of nicotine dependence: Secondary | ICD-10-CM | POA: Insufficient documentation

## 2019-07-03 DIAGNOSIS — O26893 Other specified pregnancy related conditions, third trimester: Secondary | ICD-10-CM | POA: Insufficient documentation

## 2019-07-03 DIAGNOSIS — Z3A32 32 weeks gestation of pregnancy: Secondary | ICD-10-CM | POA: Insufficient documentation

## 2019-07-03 DIAGNOSIS — O99891 Other specified diseases and conditions complicating pregnancy: Secondary | ICD-10-CM

## 2019-07-03 DIAGNOSIS — G44019 Episodic cluster headache, not intractable: Secondary | ICD-10-CM | POA: Insufficient documentation

## 2019-07-03 LAB — COMPREHENSIVE METABOLIC PANEL
ALT: 19 U/L (ref 0–44)
AST: 24 U/L (ref 15–41)
Albumin: 3 g/dL — ABNORMAL LOW (ref 3.5–5.0)
Alkaline Phosphatase: 117 U/L (ref 38–126)
Anion gap: 9 (ref 5–15)
BUN: 5 mg/dL — ABNORMAL LOW (ref 6–20)
CO2: 20 mmol/L — ABNORMAL LOW (ref 22–32)
Calcium: 8.6 mg/dL — ABNORMAL LOW (ref 8.9–10.3)
Chloride: 107 mmol/L (ref 98–111)
Creatinine, Ser: 0.56 mg/dL (ref 0.44–1.00)
GFR calc Af Amer: >60 mL/min (ref >60)
GFR calc non Af Amer: >60 mL/min (ref >60)
Glucose, Bld: 91 mg/dL (ref 70–99)
Potassium: 3.6 mmol/L (ref 3.5–5.1)
Sodium: 136 mmol/L (ref 135–145)
Total Bilirubin: 0.6 mg/dL (ref 0.3–1.2)
Total Protein: 6.1 g/dL — ABNORMAL LOW (ref 6.5–8.1)

## 2019-07-03 LAB — WET PREP, GENITAL
Clue Cells Wet Prep HPF POC: NONE SEEN
Sperm: NONE SEEN
Trich, Wet Prep: NONE SEEN
Yeast Wet Prep HPF POC: NONE SEEN

## 2019-07-03 LAB — CBC
HCT: 34.6 % — ABNORMAL LOW (ref 36.0–46.0)
Hemoglobin: 11.7 g/dL — ABNORMAL LOW (ref 12.0–15.0)
MCH: 29.5 pg (ref 26.0–34.0)
MCHC: 33.8 g/dL (ref 30.0–36.0)
MCV: 87.4 fL (ref 80.0–100.0)
Platelets: 226 10*3/uL (ref 150–400)
RBC: 3.96 MIL/uL (ref 3.87–5.11)
RDW: 12.9 % (ref 11.5–15.5)
WBC: 12.3 10*3/uL — ABNORMAL HIGH (ref 4.0–10.5)
nRBC: 0 % (ref 0.0–0.2)

## 2019-07-03 LAB — URINALYSIS, ROUTINE W REFLEX MICROSCOPIC
Bilirubin Urine: NEGATIVE
Glucose, UA: NEGATIVE mg/dL
Hgb urine dipstick: NEGATIVE
Ketones, ur: NEGATIVE mg/dL
Nitrite: POSITIVE — AB
Protein, ur: NEGATIVE mg/dL
Specific Gravity, Urine: 1.019 (ref 1.005–1.030)
pH: 6 (ref 5.0–8.0)

## 2019-07-03 LAB — POCT FERN TEST: POCT Fern Test: NEGATIVE

## 2019-07-03 MED ORDER — DEXAMETHASONE SODIUM PHOSPHATE 10 MG/ML IJ SOLN
10.0000 mg | Freq: Once | INTRAMUSCULAR | Status: AC
  Administered 2019-07-03: 10 mg via INTRAVENOUS
  Filled 2019-07-03: qty 1

## 2019-07-03 MED ORDER — BUTALBITAL-APAP-CAFFEINE 50-325-40 MG PO TABS: 1.0000 | ORAL_TABLET | Freq: Four times a day (QID) | ORAL | 0 refills | Status: DC | PRN

## 2019-07-03 MED ORDER — DIPHENHYDRAMINE HCL 50 MG/ML IJ SOLN
12.5000 mg | Freq: Once | INTRAMUSCULAR | Status: AC
  Administered 2019-07-03: 12.5 mg via INTRAVENOUS
  Filled 2019-07-03: qty 1

## 2019-07-03 MED ORDER — METOCLOPRAMIDE HCL 5 MG/ML IJ SOLN
10.0000 mg | Freq: Once | INTRAMUSCULAR | Status: AC
  Administered 2019-07-03: 10 mg via INTRAVENOUS
  Filled 2019-07-03: qty 2

## 2019-07-03 MED ORDER — LACTATED RINGERS IV BOLUS
500.0000 mL | Freq: Once | INTRAVENOUS | Status: AC
  Administered 2019-07-03: 18:00:00 500 mL via INTRAVENOUS

## 2019-07-03 MED ORDER — LACTATED RINGERS IV SOLN: INTRAVENOUS | Status: DC

## 2019-07-03 NOTE — MAU Provider Note (Addendum)
Patient Danielle Carter is a 23 y.o. G2P0101 At [redacted]w[redacted]d here with complaints of LOF this morning and afternoon. She has a history of preterm birth so she came in to get checked out. She feels strong fetal movements; she denies VB bleeding. She endorses occasional contractions.   She reports and on again, off again headache throughout the past two weeks. It has not been interfering with her activities of daily living; she did not report it to anyone or mention it to the triage nurse or her MAU RN.   History     CSN: [id number]  Arrival date and time: 07/03/19 1546   First Provider Initiated Contact with Patient 07/03/19 1700      Chief Complaint  Patient presents with  . Contractions  . Rupture of Membranes   Vaginal Discharge The patient's primary symptoms include vaginal discharge. This is a new problem. The current episode started today. Associated symptoms include abdominal pain and headaches. The vaginal discharge was watery. There has been no bleeding. She has not been passing clots. She has not been passing tissue. Nothing aggravates the symptoms.  Headache  This is a new problem. The current episode started 1 to 4 weeks ago. The pain is located in the frontal region. The pain is at a severity of 7/10. Associated symptoms include abdominal pain and a visual change. Pertinent negatives include no dizziness.   She reports that she was "unusually wet" this morning; when she went to the bathroom she noticed that her underwear was wet. This was around 9 or 10 am. Since then, at 2:15pm, she felt like she had more water coming out of her; it was not yellow like urine.     She has not used a pad, has not had to sit on a towel, but she did change her underwear twice.   She feels like her uterus was clamping down on the baby; she tried lying down, she tried breathing, walking around her house. Nothing seemed to help.   She endorses HA and blurry vision.  Her HA has been going on for two weeks;  she has been taking Tylenol but it is not helping.  She also feels like she is looking "through a kalaiscope"; this has also been going on. She has off and on blurry vision too. She denies black spots.   She reports that she has been able to drive herself to and from work; she is a Conservation officer, nature as well. Her headache and vision are not interfering with her work. Her headache and eye changes have not been interfering with eating or brushing teeth, reading or writing.  OB History    Gravida  2   Para  1   Term  0   Preterm  1   AB  0   Living  1     SAB      TAB      Ectopic      Multiple      Live Births  1           Past Medical History:  Diagnosis Date  . Anemia   . Anxiety   . Depression   . PTSD (post-traumatic stress disorder)     Past Surgical History:  Procedure Laterality Date  . DRUG INDUCED ENDOSCOPY      Family History  Problem Relation Age of Onset  . Fibromyalgia Mother   . Pancreatitis Father   . Graves' disease Father   . Depression Father  Social History   Tobacco Use  . Smoking status: Former Smoker    Types: Cigarettes    Quit date: 12/10/2018    Years since quitting: 0.5  . Smokeless tobacco: Never Used  Substance Use Topics  . Alcohol use: Never    Frequency: Never  . Drug use: Never    Allergies:  Allergies  Allergen Reactions  . Abilify [Aripiprazole]   . Latex   . Pristiq [Desvenlafaxine]   . Rocephin [Ceftriaxone Sodium In Dextrose] Hives    No medications prior to admission.    Review of Systems  Constitutional: Negative.   HENT: Negative.   Respiratory: Negative.   Cardiovascular: Negative.   Gastrointestinal: Positive for abdominal pain.  Genitourinary: Positive for vaginal discharge.  Musculoskeletal: Negative.   Neurological: Positive for headaches. Negative for dizziness.  Psychiatric/Behavioral: Negative.    Physical Exam   Blood pressure (!) 97/48, pulse 68, temperature 98.2 F (36.8 C),  temperature source Oral, resp. rate 16, height 5\' 4"  (1.626 m), weight 64.5 kg, last menstrual period 11/16/2018, SpO2 98 %.  Physical Exam  Constitutional: She appears well-developed.  HENT:  Head: Normocephalic.  Neck: Normal range of motion.  Genitourinary:    Vagina normal.     Genitourinary Comments: NEFG; no blood in the vagina. Negative pooling, copious white discharge but no odor, no clumpy discharge, no signs of infection.    Musculoskeletal: Normal range of motion.  Neurological: She is alert.  Skin: Skin is warm and dry.    MAU Course  Procedures  MDM -IV headache cocktail for headache 9/10; now headache is 0/10. While patient gave detailed description of her headache to CNM, she reported negative complaints to RN other than vaginal discharge.  -wet prep negative, GC CT pending -UA, CBC and CMP are normal, although slightly elevated white count. Patient is afebriel.  -NST: 140 bpm, mod var, present acel, neg decels, no contrations.  -Cervix is long, closed and posterior. FFN and amnisure not sent as cervix is long and closed and patient had no pooling or LOF while in MAU.   -Blood pressure has been normal while in MAU.  Patient Vitals for the past 24 hrs:  BP Temp Temp src Pulse Resp SpO2 Height Weight  07/03/19 1916 (!) 97/48 98.2 F (36.8 C) Oral 68 16 - - -  07/03/19 1729 - - - - - 98 % - -  07/03/19 1724 - - - - - 99 % - -  07/03/19 1719 - - - - - 99 % - -  07/03/19 1714 - - - - - 99 % - -  07/03/19 1709 - - - - - 99 % - -  07/03/19 1704 - - - - - 100 % - -  07/03/19 1659 - - - - - 98 % - -  07/03/19 1650 111/68 - - 100 - - - -  07/03/19 1631 115/63 99 F (37.2 C) Oral (!) 117 18 98 % 5\' 4"  (1.626 m) 64.5 kg    Assessment and Plan   1. Episodic cluster headache, not intractable    2. Patient feels "much better"; denies headache or vision changes right now. Contractions have resolved. Low suspicion for pre-e, although patient has HA, her BP is normal.    3. Patient stable for discharge with strict return precautions; keep follow up appt on 10-6. Reviewed normal physiologic changes in pregnancy and difference between appropriate vaginal discharge and ROM.   4. All questions answered, patient and partner desire  to leave.   Mervyn Skeeters Kisha Messman 07/03/2019, 10:30 PM

## 2019-07-03 NOTE — MAU Provider Note (Signed)
Patient Danielle Carter is a 23 y.o. G2P0101 At [redacted]w[redacted]d here with complaints of LOF this morning and afternoon. She has a history of preterm birth so she came in to get checked out. She feels strong fetal movements; she denies VB bleeding. She endorses occasional contractions.   She reports and on again, off again headache throughout the past two weeks. It has not been interfering with her activities of daily living; she did not report it to anyone or mention it to the triage nurse or her MAU RN.   History     CSN: [id number]  Arrival date and time: 07/03/19 1546   First Provider Initiated Contact with Patient 07/03/19 1700      Chief Complaint  Patient presents with  . Contractions  . Rupture of Membranes   Vaginal Discharge The patient's primary symptoms include vaginal discharge. This is a new problem. The current episode started today. Associated symptoms include abdominal pain and headaches. The vaginal discharge was watery. There has been no bleeding. She has not been passing clots. She has not been passing tissue. Nothing aggravates the symptoms.  Headache  This is a new problem. The current episode started 1 to 4 weeks ago. The pain is located in the frontal region. The pain is at a severity of 7/10. Associated symptoms include abdominal pain and a visual change. Pertinent negatives include no dizziness.   She reports that she was "unusually wet" this morning; when she went to the bathroom she noticed that her underwear was wet. This was around 9 or 10 am. Since then, at 2:15pm, she felt like she had more water coming out of her; it was not yellow like urine.     She has not used a pad, has not had to sit on a towel, but she did change her underwear twice.   She feels like her uterus was clamping down on the baby; she tried lying down, she tried breathing, walking around her house. Nothing seemed to help.   She endorses HA and blurry vision.  Her HA has been going on for two weeks;  she has been taking Tylenol but it is not helping.  She also feels like she is looking "through a kalaiscope"; this has also been going on. She has off and on blurry vision too. She denies black spots.   She reports that she has been able to drive herself to and from work; she is a Conservation officer, nature as well. Her headache and vision are not interfering with her work. Her headache and eye changes have not been interfering with eating or brushing teeth, reading or writing.  OB History    Gravida  2   Para  1   Term  0   Preterm  1   AB  0   Living  1     SAB      TAB      Ectopic      Multiple      Live Births  1           Past Medical History:  Diagnosis Date  . Anemia   . Anxiety   . Depression   . PTSD (post-traumatic stress disorder)     Past Surgical History:  Procedure Laterality Date  . DRUG INDUCED ENDOSCOPY      Family History  Problem Relation Age of Onset  . Fibromyalgia Mother   . Pancreatitis Father   . Graves' disease Father   . Depression Father  Social History   Tobacco Use  . Smoking status: Former Smoker    Types: Cigarettes    Quit date: 12/10/2018    Years since quitting: 0.5  . Smokeless tobacco: Never Used  Substance Use Topics  . Alcohol use: Never    Frequency: Never  . Drug use: Never    Allergies:  Allergies  Allergen Reactions  . Abilify [Aripiprazole]   . Latex   . Pristiq [Desvenlafaxine]   . Rocephin [Ceftriaxone Sodium In Dextrose] Hives    No medications prior to admission.    Review of Systems  Constitutional: Negative.   HENT: Negative.   Respiratory: Negative.   Cardiovascular: Negative.   Gastrointestinal: Positive for abdominal pain.  Genitourinary: Positive for vaginal discharge.  Musculoskeletal: Negative.   Neurological: Positive for headaches. Negative for dizziness.  Psychiatric/Behavioral: Negative.    Physical Exam   Blood pressure (!) 97/48, pulse 68, temperature 98.2 F (36.8 C),  temperature source Oral, resp. rate 16, height 5\' 4"  (1.626 m), weight 64.5 kg, last menstrual period 11/16/2018, SpO2 98 %.  Physical Exam  Constitutional: She appears well-developed.  HENT:  Head: Normocephalic.  Neck: Normal range of motion.  Genitourinary:    Vagina normal.     Genitourinary Comments: NEFG; no blood in the vagina. Negative pooling, copious white discharge but no odor, no clumpy discharge, no signs of infection.    Musculoskeletal: Normal range of motion.  Neurological: She is alert.  Skin: Skin is warm and dry.    MAU Course  Procedures  MDM -IV headache cocktail for headache 9/10; now headache is 0/10. While patient gave detailed description of her headache to CNM, she reported negative complaints to RN other than vaginal discharge.  -wet prep negative, GC CT pending -UA, CBC and CMP are normal, although slightly elevated white count. Patient is afebriel.  -NST: 140 bpm, mod var, present acel, neg decels, no contrations.  -Cervix is long, closed and posterior. FFN and amnisure not sent as cervix is long and closed and patient had no pooling or LOF while in MAU.   -Blood pressure has been normal while in MAU.  Patient Vitals for the past 24 hrs:  BP Temp Temp src Pulse Resp SpO2 Height Weight  07/03/19 1916 (!) 97/48 98.2 F (36.8 C) Oral 68 16 - - -  07/03/19 1729 - - - - - 98 % - -  07/03/19 1724 - - - - - 99 % - -  07/03/19 1719 - - - - - 99 % - -  07/03/19 1714 - - - - - 99 % - -  07/03/19 1709 - - - - - 99 % - -  07/03/19 1704 - - - - - 100 % - -  07/03/19 1659 - - - - - 98 % - -  07/03/19 1650 111/68 - - 100 - - - -  07/03/19 1631 115/63 99 F (37.2 C) Oral (!) 117 18 98 % 5\' 4"  (1.626 m) 64.5 kg    Assessment and Plan   1. Episodic cluster headache, not intractable    2. Patient feels "much better"; denies headache or vision changes right now. Contractions have resolved.   3. Patient stable for discharge with strict return precautions; keep  follow up appt on 10-6.   Charlesetta GaribaldiKathryn Lorraine Kooistra 07/03/2019, 5:03 PM    Attestation of Attending Supervision of Advanced Practice Provider (PA/CNM/NP): Evaluation and management procedures were performed by the Advanced Practice Provider under my supervision and collaboration.  I have reviewed the Advanced Practice Provider's note and chart, and I agree with the management and plan.  Verita Schneiders, MD, [place] for [employer], [place]

## 2019-07-03 NOTE — MAU Note (Signed)
Frequent contraction, watery d/c started coming out 2 hrs ago. No bleeding, no recent intercourse.

## 2019-07-04 LAB — GC/CHLAMYDIA PROBE AMP (~~LOC~~) NOT AT ARMC
Chlamydia: NEGATIVE
Neisseria Gonorrhea: NEGATIVE

## 2019-07-08 ENCOUNTER — Telehealth: Payer: Medicaid Other | Admitting: Obstetrics

## 2019-07-11 ENCOUNTER — Encounter: Payer: Self-pay | Admitting: Obstetrics

## 2019-07-11 ENCOUNTER — Telehealth (INDEPENDENT_AMBULATORY_CARE_PROVIDER_SITE_OTHER): Payer: Medicaid Other | Admitting: Obstetrics

## 2019-07-11 DIAGNOSIS — O99891 Other specified diseases and conditions complicating pregnancy: Secondary | ICD-10-CM

## 2019-07-11 DIAGNOSIS — O350XX0 Maternal care for (suspected) central nervous system malformation in fetus, not applicable or unspecified: Secondary | ICD-10-CM

## 2019-07-11 DIAGNOSIS — O09211 Supervision of pregnancy with history of pre-term labor, first trimester: Secondary | ICD-10-CM

## 2019-07-11 DIAGNOSIS — O0990 Supervision of high risk pregnancy, unspecified, unspecified trimester: Secondary | ICD-10-CM

## 2019-07-11 DIAGNOSIS — R8271 Bacteriuria: Secondary | ICD-10-CM

## 2019-07-11 DIAGNOSIS — IMO0002 Reserved for concepts with insufficient information to code with codable children: Secondary | ICD-10-CM

## 2019-07-11 DIAGNOSIS — Z3A33 33 weeks gestation of pregnancy: Secondary | ICD-10-CM

## 2019-07-11 DIAGNOSIS — O09893 Supervision of other high risk pregnancies, third trimester: Secondary | ICD-10-CM

## 2019-07-11 DIAGNOSIS — M549 Dorsalgia, unspecified: Secondary | ICD-10-CM

## 2019-07-11 DIAGNOSIS — O09213 Supervision of pregnancy with history of pre-term labor, third trimester: Secondary | ICD-10-CM

## 2019-07-11 DIAGNOSIS — O0993 Supervision of high risk pregnancy, unspecified, third trimester: Secondary | ICD-10-CM

## 2019-07-11 MED ORDER — COMFORT FIT MATERNITY SUPP SM MISC: 0 refills | Status: DC

## 2019-07-11 NOTE — Progress Notes (Signed)
   TELEHEALTH OBSTETRICS PRENATAL VIRTUAL VIDEO VISIT ENCOUNTER NOTE  Provider location: [facility] [place]   I connected with [name] on 07/11/19 at  9:30 AM EDT by OB MyChart Video Encounter at home and verified that I am speaking with the correct person using two identifiers.   I discussed the limitations, risks, security and privacy concerns of performing an evaluation and management service virtually and the availability of in person appointments. I also discussed with the patient that there may be a patient responsible charge related to this service. The patient expressed understanding and agreed to proceed. Subjective:  Danielle Carter is a 23 y.o. G2P0101 at [redacted]w[redacted]d being seen today for ongoing prenatal care.  She is currently monitored for the following issues for this high-risk pregnancy and has Supervision of high risk pregnancy, antepartum; History of preterm labor, current pregnancy; Asymptomatic bacteriuria during pregnancy; Low grade squamous intraepith lesion on cytologic smear cervix (lgsil); and Choroid plexus cyst of fetus on prenatal ultrasound on their problem list.  Patient reports backache.  Contractions: Irritability. Vag. Bleeding: None.  Movement: Present. Denies any leaking of fluid.   The following portions of the patient's history were reviewed and updated as appropriate: allergies, current medications, past family history, past medical history, past social history, past surgical history and problem list.   Objective:  There were no vitals filed for this visit.  Fetal Status:     Movement: Present     General:  Alert, oriented and cooperative. Patient is in no acute distress.  Respiratory: Normal respiratory effort, no problems with respiration noted  Mental Status: Normal mood and affect. Normal behavior. Normal judgment and thought content.  Rest of physical exam deferred due to type of encounter  Imaging: No results found.   Assessment and Plan:  Pregnancy: G2P0101 at [redacted]w[redacted]d 1. Supervision of high risk pregnancy, antepartum  2. Choroid plexus cyst of fetus on prenatal ultrasound - resolved   3. Current pregnancy with history of pre-term labor in first trimester  4. History of preterm delivery, currently pregnant in third trimester  5. Asymptomatic bacteriuria during pregnancy - urine culture at 36 weeks  6. Backache symptom Rx: - Elastic Bandages & Supports (COMFORT FIT MATERNITY SUPP SM) MISC; Wear as directed.  Dispense: 1 each; Refill: 0   Preterm labor symptoms and general obstetric precautions including but not limited to vaginal bleeding, contractions, leaking of fluid and fetal movement were reviewed in detail with the patient. I discussed the assessment and treatment plan with the patient. The patient was provided an opportunity to ask questions and all were answered. The patient agreed with the plan and demonstrated an understanding of the instructions. The patient was advised to call back or seek an in-person office evaluation/go to MAU at [facility] for any urgent or concerning symptoms. Please refer to After Visit Summary for other counseling recommendations.   I provided 10 minutes of face-to-face time during this encounter.  Return in about 2 weeks (around 07/25/2019) for MyChart.  HOB patient-Faculty only.Baltazar Najjar, MD [facility], [place] [facility] 07/11/2019

## 2019-07-11 NOTE — Progress Notes (Signed)
ROB Virtual Visit   CC: pt in abdomen .

## 2019-07-13 ENCOUNTER — Encounter (HOSPITAL_COMMUNITY): Payer: Self-pay

## 2019-07-13 ENCOUNTER — Other Ambulatory Visit: Payer: Self-pay

## 2019-07-13 ENCOUNTER — Inpatient Hospital Stay (HOSPITAL_COMMUNITY)
Admission: AD | Admit: 2019-07-13 | Discharge: 2019-07-13 | Disposition: A | Discharge status: Home / Self Care | Payer: Medicaid Other | Source: Ambulatory Visit | Attending: Obstetrics & Gynecology | Admitting: Obstetrics & Gynecology

## 2019-07-13 DIAGNOSIS — O350XX0 Maternal care for (suspected) central nervous system malformation in fetus, not applicable or unspecified: Secondary | ICD-10-CM | POA: Diagnosis not present

## 2019-07-13 DIAGNOSIS — Z3A33 33 weeks gestation of pregnancy: Secondary | ICD-10-CM | POA: Diagnosis not present

## 2019-07-13 DIAGNOSIS — O4703 False labor before 37 completed weeks of gestation, third trimester: Secondary | ICD-10-CM | POA: Diagnosis not present

## 2019-07-13 DIAGNOSIS — Z888 Allergy status to other drugs, medicaments and biological substances status: Secondary | ICD-10-CM | POA: Insufficient documentation

## 2019-07-13 DIAGNOSIS — Z87891 Personal history of nicotine dependence: Secondary | ICD-10-CM | POA: Insufficient documentation

## 2019-07-13 DIAGNOSIS — Z881 Allergy status to other antibiotic agents status: Secondary | ICD-10-CM | POA: Diagnosis not present

## 2019-07-13 DIAGNOSIS — Z91040 Latex allergy status: Secondary | ICD-10-CM | POA: Diagnosis not present

## 2019-07-13 DIAGNOSIS — IMO0002 Reserved for concepts with insufficient information to code with codable children: Secondary | ICD-10-CM

## 2019-07-13 LAB — URINALYSIS, ROUTINE W REFLEX MICROSCOPIC
Bilirubin Urine: NEGATIVE
Glucose, UA: NEGATIVE mg/dL
Hgb urine dipstick: NEGATIVE
Ketones, ur: NEGATIVE mg/dL
Nitrite: NEGATIVE
Protein, ur: NEGATIVE mg/dL
Specific Gravity, Urine: 1.018 (ref 1.005–1.030)
pH: 6 (ref 5.0–8.0)

## 2019-07-13 MED ORDER — NIFEDIPINE ER OSMOTIC RELEASE 30 MG PO TB24: 30.0000 mg | ORAL_TABLET | Freq: Every day | ORAL | 1 refills | Status: DC

## 2019-07-13 MED ORDER — NIFEDIPINE 10 MG PO CAPS
10.0000 mg | ORAL_CAPSULE | ORAL | Status: AC
  Administered 2019-07-13 (×3): 10 mg via ORAL
  Filled 2019-07-13 (×3): qty 1

## 2019-07-13 NOTE — Discharge Instructions (Signed)
Please call your doctor's office for 6 or more painful contractions.

## 2019-07-13 NOTE — MAU Note (Signed)
Danielle Carter is a 23 y.o. at [redacted]w[redacted]d here in MAU reporting: states since this morning she has been having abdominal pain. It starts at the top of her belly and then moves down, it cramps at the bottom and she has increased pressure. The pain comes about every 10-15 minutes. Also having a constant pressure regardless of if the pain is happening. No LOF, no bleeding. +FM. States this feels different then her last visit and "doesn't seem right"  Onset of complaint: today  Pain score: 6/10  Vitals:   07/13/19 1554  BP: 117/61  Pulse: 90  Resp: 16  Temp: 98.3 F (36.8 C)  SpO2: 99%     FHT: +FM  Lab orders placed from triage: UA

## 2019-07-13 NOTE — MAU Provider Note (Signed)
History     CSN: [id number]  Arrival date and time: 07/13/19 1537   First Provider Initiated Contact with Patient 07/13/19 1635      Chief Complaint  Patient presents with  . Abdominal Pain   HPI  Ms.  Danielle Carter is a 23 y.o. year old G53P0101 female at 45w3dweeks gestation who presents to MAU reporting abdominal pain since 0930. She reports the location is at the top of her belly and radiates down towards her pelvis; occurring every 10-15 mins. Once the pain gets down in her pelvis, the pain causes increased pressure making her feel like she "has to poop." She reports the pain & pressure have progressively gotten worse throughout the day. She was able to work today (works at [employer]. She denies VB or abnormal vaginal d/c. She last had SI last night (07/12/19).  Past Medical History:  Diagnosis Date  . Anemia   . Anxiety   . Depression   . PTSD (post-traumatic stress disorder)     Past Surgical History:  Procedure Laterality Date  . DRUG INDUCED ENDOSCOPY      Family History  Problem Relation Age of Onset  . Fibromyalgia Mother   . Pancreatitis Father   . Graves' disease Father   . Depression Father     Social History   Tobacco Use  . Smoking status: Former Smoker    Types: Cigarettes    Quit date: 12/10/2018    Years since quitting: 0.5  . Smokeless tobacco: Never Used  Substance Use Topics  . Alcohol use: Never    Frequency: Never  . Drug use: Never    Allergies:  Allergies  Allergen Reactions  . Abilify [Aripiprazole]   . Latex   . Pristiq [Desvenlafaxine]   . Rocephin [Ceftriaxone Sodium In Dextrose] Hives    Medications Prior to Admission  Medication Sig Dispense Refill Last Dose  . Blood Pressure Monitor KIT 1 each by Does not apply route daily. 1 each 0   . butalbital-acetaminophen-caffeine (FIORICET) 50-325-40 MG tablet Take 1 tablet by mouth every 6 (six) hours as needed for headache. 20 tablet 0   . Elastic Bandages & Supports  (COMFORT FIT MATERNITY SUPP SM) MISC Wear as directed. 1 each 0   . ondansetron (ZOFRAN) 4 MG tablet Take 1 tablet (4 mg total) by mouth every 6 (six) hours. (Patient not taking: Reported on 02/12/2019) 12 tablet 0   . Prenat-FeAsp-Meth-FA-DHA w/o A (PRENATE PIXIE) 10-0.6-0.4-200 MG CAPS Take 1 tablet by mouth daily. 90 capsule 6   . promethazine (PHENERGAN) 25 MG tablet Take 1 tablet (25 mg total) by mouth every 6 (six) hours as needed for nausea or vomiting. (Patient not taking: Reported on 02/12/2019) 30 tablet 2     Review of Systems  Constitutional: Negative.   HENT: Negative.   Eyes: Negative.   Respiratory: Negative.   Cardiovascular: Negative.   Gastrointestinal: Positive for abdominal pain.  Endocrine: Negative.   Genitourinary: Positive for pelvic pain.  Musculoskeletal: Negative.   Skin: Negative.   Allergic/Immunologic: Negative.   Neurological: Negative.   Hematological: Negative.   Psychiatric/Behavioral: Negative.    Physical Exam   Blood pressure 117/61, pulse 90, temperature 98.3 F (36.8 C), temperature source Oral, resp. rate 16, height '5\' 4"'$  (1.626 m), weight 65.9 kg, last menstrual period 11/16/2018, SpO2 99 %.  Physical Exam  Constitutional: She is oriented to person, place, and time. She appears well-developed and well-nourished.  HENT:  Head: Normocephalic and atraumatic.  Eyes: Pupils are equal, round, and reactive to light.  Neck: Normal range of motion.  Cardiovascular: Normal rate.  Respiratory: Effort normal.  GI: Soft.  Genitourinary:    Genitourinary Comments: Dilation: Closed Effacement (%): Thick Cervical Position: Posterior Exam by: Ruby Logiudice cnm    Musculoskeletal: Normal range of motion.  Neurological: She is alert and oriented to person, place, and time. She has normal reflexes.  Skin: Skin is warm and dry.  Psychiatric: She has a normal mood and affect. Her behavior is normal. Judgment and thought content normal.   NST - FHR: 135 bpm /  moderate variability / accels present / decels absent / TOCO: irregular UC's  MAU Course  Procedures  MDM CCUA  *Consult with Dr. Harolyn Rutherford @ 1800 - notified of patient's complaints, assessments, lab & NST results, recommended tx plan d/c home with Rx for Procardia XL 30 mg daily for comfort - ok to d/c home  Results for orders placed or performed during the hospital encounter of 07/13/19 (from the past 24 hour(s))  Urinalysis, Routine w reflex microscopic     Status: Abnormal   Collection Time: 07/13/19  5:30 PM  Result Value Ref Range   Color, Urine YELLOW YELLOW   APPearance HAZY (A) CLEAR   Specific Gravity, Urine 1.018 1.005 - 1.030   pH 6.0 5.0 - 8.0   Glucose, UA NEGATIVE NEGATIVE mg/dL   Hgb urine dipstick NEGATIVE NEGATIVE   Bilirubin Urine NEGATIVE NEGATIVE   Ketones, ur NEGATIVE NEGATIVE mg/dL   Protein, ur NEGATIVE NEGATIVE mg/dL   Nitrite NEGATIVE NEGATIVE   Leukocytes,Ua TRACE (A) NEGATIVE   RBC / HPF 0-5 0 - 5 RBC/hpf   WBC, UA 11-20 0 - 5 WBC/hpf   Bacteria, UA RARE (A) NONE SEEN   Squamous Epithelial / LPF 0-5 0 - 5   Mucus PRESENT    Ca Oxalate Crys, UA PRESENT     Assessment and Plan  Preterm uterine contractions in third trimester, antepartum  - Rx for Procardia XL 30 mg daily - Instructed to call OB office or return to MAU for 6 or more painful UCs that are not lessened with use of Procardia - Keep My Chart scheduled  Appt on 07/28/19, unless needs to be seen before then  - Discharge patient - Patient verbalized an understanding of the plan of care and agrees.     Laury Deep, MSN, CNM 07/13/2019, 4:35 PM

## 2019-07-23 ENCOUNTER — Telehealth: Payer: Self-pay

## 2019-07-23 ENCOUNTER — Other Ambulatory Visit: Payer: Self-pay

## 2019-07-23 ENCOUNTER — Encounter (HOSPITAL_COMMUNITY): Payer: Self-pay

## 2019-07-23 ENCOUNTER — Inpatient Hospital Stay (HOSPITAL_COMMUNITY)
Admission: AD | Admit: 2019-07-23 | Discharge: 2019-07-23 | Disposition: A | Discharge status: Home / Self Care | Payer: Medicaid Other | Attending: Obstetrics & Gynecology | Admitting: Obstetrics & Gynecology

## 2019-07-23 DIAGNOSIS — Z881 Allergy status to other antibiotic agents status: Secondary | ICD-10-CM | POA: Diagnosis not present

## 2019-07-23 DIAGNOSIS — O09213 Supervision of pregnancy with history of pre-term labor, third trimester: Secondary | ICD-10-CM

## 2019-07-23 DIAGNOSIS — O350XX0 Maternal care for (suspected) central nervous system malformation in fetus, not applicable or unspecified: Secondary | ICD-10-CM | POA: Diagnosis not present

## 2019-07-23 DIAGNOSIS — O4703 False labor before 37 completed weeks of gestation, third trimester: Secondary | ICD-10-CM | POA: Diagnosis not present

## 2019-07-23 DIAGNOSIS — Z79899 Other long term (current) drug therapy: Secondary | ICD-10-CM | POA: Diagnosis not present

## 2019-07-23 DIAGNOSIS — Z3A34 34 weeks gestation of pregnancy: Secondary | ICD-10-CM | POA: Diagnosis not present

## 2019-07-23 DIAGNOSIS — IMO0002 Reserved for concepts with insufficient information to code with codable children: Secondary | ICD-10-CM

## 2019-07-23 DIAGNOSIS — O358XX0 Maternal care for other (suspected) fetal abnormality and damage, not applicable or unspecified: Secondary | ICD-10-CM | POA: Insufficient documentation

## 2019-07-23 DIAGNOSIS — Z87891 Personal history of nicotine dependence: Secondary | ICD-10-CM | POA: Insufficient documentation

## 2019-07-23 LAB — URINALYSIS, ROUTINE W REFLEX MICROSCOPIC
Bilirubin Urine: NEGATIVE
Glucose, UA: NEGATIVE mg/dL
Hgb urine dipstick: NEGATIVE
Ketones, ur: NEGATIVE mg/dL
Nitrite: NEGATIVE
Protein, ur: NEGATIVE mg/dL
Specific Gravity, Urine: 1.011 (ref 1.005–1.030)
pH: 6 (ref 5.0–8.0)

## 2019-07-23 LAB — AMNISURE RUPTURE OF MEMBRANE (ROM) NOT AT ARMC: Amnisure ROM: NEGATIVE

## 2019-07-23 LAB — WET PREP, GENITAL
Clue Cells Wet Prep HPF POC: NONE SEEN
Sperm: NONE SEEN
Trich, Wet Prep: NONE SEEN
Yeast Wet Prep HPF POC: NONE SEEN

## 2019-07-23 MED ORDER — PROMETHAZINE HCL 25 MG/ML IJ SOLN
25.0000 mg | Freq: Once | INTRAMUSCULAR | Status: AC
  Administered 2019-07-23: 17:00:00 25 mg via INTRAMUSCULAR
  Filled 2019-07-23: qty 1

## 2019-07-23 MED ORDER — BETAMETHASONE SOD PHOS & ACET 6 (3-3) MG/ML IJ SUSP
12.0000 mg | INTRAMUSCULAR | Status: DC
  Administered 2019-07-23: 17:00:00 12 mg via INTRAMUSCULAR
  Filled 2019-07-23: qty 2

## 2019-07-23 MED ORDER — BUTORPHANOL TARTRATE 1 MG/ML IJ SOLN
1.0000 mg | Freq: Once | INTRAMUSCULAR | Status: AC
  Administered 2019-07-23: 17:00:00 1 mg via INTRAMUSCULAR
  Filled 2019-07-23: qty 1

## 2019-07-23 MED ORDER — LACTATED RINGERS IV BOLUS
1000.0000 mL | Freq: Once | INTRAVENOUS | Status: AC
  Administered 2019-07-23: 15:00:00 1000 mL via INTRAVENOUS

## 2019-07-23 MED ORDER — NIFEDIPINE 10 MG PO CAPS
10.0000 mg | ORAL_CAPSULE | ORAL | Status: AC
  Administered 2019-07-23 (×3): 10 mg via ORAL
  Filled 2019-07-23 (×3): qty 1

## 2019-07-23 NOTE — MAU Note (Signed)
Not enough urine for culture tube 

## 2019-07-23 NOTE — MAU Provider Note (Signed)
History     CSN: [id number]  Arrival date and time: 07/23/19 1250   First Provider Initiated Contact with Patient 07/23/19 1409      Chief Complaint  Patient presents with  . Contractions   Danielle Carter is a 23 y.o. G2P0101 at 41w6dwho presents today with contractions. She states that the contractions started around 10:30 this morning. She states that they have been about 5 mins apart. She denies any VB or  LOF. She reports normal fetal movement.  Pelvic Pain The patient's primary symptoms include pelvic pain. The patient's pertinent negatives include no vaginal discharge. This is a new problem. The current episode started today. The problem occurs intermittently. The problem has been unchanged. The problem affects both sides. She is pregnant. Pertinent negatives include no chills, dysuria, fever, frequency, nausea or vomiting. The vaginal discharge was normal. There has been no bleeding. Nothing aggravates the symptoms. She has tried nothing for the symptoms.    OB History    Gravida  2   Para  1   Term  0   Preterm  1   AB  0   Living  1     SAB      TAB      Ectopic      Multiple      Live Births  1           Past Medical History:  Diagnosis Date  . Anemia   . Anxiety   . Depression   . PTSD (post-traumatic stress disorder)     Past Surgical History:  Procedure Laterality Date  . DRUG INDUCED ENDOSCOPY      Family History  Problem Relation Age of Onset  . Fibromyalgia Mother   . Pancreatitis Father   . Graves' disease Father   . Depression Father     Social History   Tobacco Use  . Smoking status: Former Smoker    Types: Cigarettes    Quit date: 12/10/2018    Years since quitting: 0.6  . Smokeless tobacco: Never Used  Substance Use Topics  . Alcohol use: Never    Frequency: Never  . Drug use: Never    Allergies:  Allergies  Allergen Reactions  . Abilify [Aripiprazole]   . Latex   . Pristiq [Desvenlafaxine]   . Rocephin  [Ceftriaxone Sodium In Dextrose] Hives    Medications Prior to Admission  Medication Sig Dispense Refill Last Dose  . Blood Pressure Monitor KIT 1 each by Does not apply route daily. 1 each 0   . butalbital-acetaminophen-caffeine (FIORICET) 50-325-40 MG tablet Take 1 tablet by mouth every 6 (six) hours as needed for headache. 20 tablet 0   . Elastic Bandages & Supports (COMFORT FIT MATERNITY SUPP SM) MISC Wear as directed. 1 each 0   . NIFEdipine (PROCARDIA XL) 30 MG 24 hr tablet Take 1 tablet (30 mg total) by mouth daily. 30 tablet 1   . ondansetron (ZOFRAN) 4 MG tablet Take 1 tablet (4 mg total) by mouth every 6 (six) hours. (Patient not taking: Reported on 02/12/2019) 12 tablet 0   . Prenat-FeAsp-Meth-FA-DHA w/o A (PRENATE PIXIE) 10-0.6-0.4-200 MG CAPS Take 1 tablet by mouth daily. 90 capsule 6   . promethazine (PHENERGAN) 25 MG tablet Take 1 tablet (25 mg total) by mouth every 6 (six) hours as needed for nausea or vomiting. (Patient not taking: Reported on 02/12/2019) 30 tablet 2     Review of Systems  Constitutional: Negative for chills and  fever.  Gastrointestinal: Negative for nausea and vomiting.  Genitourinary: Positive for pelvic pain. Negative for dysuria, frequency, vaginal bleeding and vaginal discharge.   Physical Exam   Blood pressure 114/66, pulse 86, temperature 98.2 F (36.8 C), temperature source Oral, resp. rate 18, weight 66.5 kg, last menstrual period 11/16/2018, SpO2 99 %.  Physical Exam  Nursing note and vitals reviewed. Constitutional: She is oriented to person, place, and time. She appears well-developed and well-nourished. No distress.  HENT:  Head: Normocephalic.  Cardiovascular: Normal rate.  Respiratory: Effort normal.  GI: Soft. There is no abdominal tenderness. There is no rebound.  Neurological: She is alert and oriented to person, place, and time.  Skin: Skin is warm and dry.  Psychiatric: She has a normal mood and affect.    Dilation: 1 Effacement  (%): 50 Station: -2 Exam by:: hogan cnm  NST:  Baseline: 130 Variability: moderate Accels: 15x15 Decels: none Toco: q 5 mins   Results for orders placed or performed during the hospital encounter of 07/23/19 (from the past 24 hour(s))  Urinalysis, Routine w reflex microscopic     Status: Abnormal   Collection Time: 07/23/19  2:00 PM  Result Value Ref Range   Color, Urine YELLOW YELLOW   APPearance HAZY (A) CLEAR   Specific Gravity, Urine 1.011 1.005 - 1.030   pH 6.0 5.0 - 8.0   Glucose, UA NEGATIVE NEGATIVE mg/dL   Hgb urine dipstick NEGATIVE NEGATIVE   Bilirubin Urine NEGATIVE NEGATIVE   Ketones, ur NEGATIVE NEGATIVE mg/dL   Protein, ur NEGATIVE NEGATIVE mg/dL   Nitrite NEGATIVE NEGATIVE   Leukocytes,Ua SMALL (A) NEGATIVE   RBC / HPF 0-5 0 - 5 RBC/hpf   WBC, UA 6-10 0 - 5 WBC/hpf   Bacteria, UA FEW (A) NONE SEEN   Squamous Epithelial / LPF 6-10 0 - 5   Mucus PRESENT   Wet prep, genital     Status: Abnormal   Collection Time: 07/23/19  3:36 PM  Result Value Ref Range   Yeast Wet Prep HPF POC NONE SEEN NONE SEEN   Trich, Wet Prep NONE SEEN NONE SEEN   Clue Cells Wet Prep HPF POC NONE SEEN NONE SEEN   WBC, Wet Prep HPF POC MODERATE (A) NONE SEEN   Sperm NONE SEEN   Amnisure rupture of membrane (rom)not at [facility]     Status: None   Collection Time: 07/23/19  4:05 PM  Result Value Ref Range   Amnisure ROM NEGATIVE     MAU Course  Procedures  MDM 1700: No cervical change. Contractions still continue despite IV fluids and procardia. Consult with Dr. Harolyn Rutherford will provide symptomatic treatment and BMZ, but not needing admission at this time.   Stadol and phenergan given for therapeutic rest  BMZ today  Patient to be DC home today and return tomorrow for 2nd BMZ or sooner if needed for worsening contractions.    Assessment and Plan   1. Threatened premature labor in third trimester   2. Choroid plexus cyst of fetus on prenatal ultrasound   3. Current pregnancy with  history of pre-term labor in third trimester   4. [redacted] weeks gestation of pregnancy    DC home Comfort measures reviewed  3rd Trimester precautions  PTL precautions  Fetal kick counts RX: none Return to MAU as needed FU with OB as planned  Follow-up Information    [facility] Follow up.   Specialty: Obstetrics and Gynecology Why: Return tomorrow 10/22 around  5:00 pm for a steroid injection or sooner if needed due to worsening contractions  Contact information: [address] [id number] Cheney Opal [place] DNP, CNM  07/23/19  5:34 PM

## 2019-07-23 NOTE — MAU Note (Signed)
Is in so much pain, feels like contractions.  Started at 1030 this morning, q4-72min. Has med for PTL, hasn't taken it in a couple days because they had stopped.  Hx of PTl/D. No bleeding or leaking.

## 2019-07-23 NOTE — Discharge Instructions (Signed)
Preterm Labor and Birth Information °Pregnancy normally lasts 39-41 weeks. Preterm labor is when labor starts early. It starts before you have been pregnant for 37 whole weeks. °What are the risk factors for preterm labor? °Preterm labor is more likely to occur in women who: °· Have an infection while pregnant. °· Have a cervix that is short. °· Have gone into preterm labor before. °· Have had surgery on their cervix. °· Are younger than age 21. °· Are older than age 35. °· Are African American. °· Are pregnant with two or more babies. °· Take street drugs while pregnant. °· Smoke while pregnant. °· Do not gain enough weight while pregnant. °· Got pregnant right after another pregnancy. °What are the symptoms of preterm labor? °Symptoms of preterm labor include: °· Cramps. The cramps may feel like the cramps some women get during their period. The cramps may happen with watery poop (diarrhea). °· Pain in the belly (abdomen). °· Pain in the lower back. °· Regular contractions or tightening. It may feel like your belly is getting tighter. °· Pressure in the lower belly that seems to get stronger. °· More fluid (discharge) leaking from the vagina. The fluid may be watery or bloody. °· Water breaking. °Why is it important to notice signs of preterm labor? °Babies who are born early may not be fully developed. They have a higher chance for: °· Long-term heart problems. °· Long-term lung problems. °· Trouble controlling body systems, like breathing. °· Bleeding in the brain. °· A condition called cerebral palsy. °· Learning difficulties. °· Death. °These risks are highest for babies who are born before 34 weeks of pregnancy. °How is preterm labor treated? °Treatment depends on: °· How long you were pregnant. °· Your condition. °· The health of your baby. °Treatment may involve: °· Having a stitch (suture) placed in your cervix. When you give birth, your cervix opens so the baby can come out. The stitch keeps the cervix  from opening too soon. °· Staying at the hospital. °· Taking or getting medicines, such as: °? Hormone medicines. °? Medicines to stop contractions. °? Medicines to help the baby’s lungs develop. °? Medicines to prevent your baby from having cerebral palsy. °What should I do if I am in preterm labor? °If you think you are going into labor too soon, call your doctor right away. °How can I prevent preterm labor? °· Do not use any tobacco products. °? Examples of these are cigarettes, chewing tobacco, and e-cigarettes. °? If you need help quitting, ask your doctor. °· Do not use street drugs. °· Do not use any medicines unless you ask your doctor if they are safe for you. °· Talk with your doctor before taking any herbal supplements. °· Make sure you gain enough weight. °· Watch for infection. If you think you might have an infection, get it checked right away. °· If you have gone into preterm labor before, tell your doctor. °This information is not intended to replace advice given to you by your health care provider. Make sure you discuss any questions you have with your health care provider. °Document Released: 12/15/2008 Document Revised: 01/10/2019 Document Reviewed: 02/09/2016 °Elsevier Patient Education © 2020 Elsevier Inc. ° °

## 2019-07-23 NOTE — Telephone Encounter (Signed)
Pt called and reports that she is having painful contractions 5-6 minutes apart. Pt also reports a slight decrease in fetal movement. Pt denies any bleeding or leaking of fluid. I advised pt to go to the hospital as soon as possible for evaluation. Pt verbalizes understanding and says her dad will take her.

## 2019-07-24 ENCOUNTER — Inpatient Hospital Stay (HOSPITAL_COMMUNITY)
Admission: AD | Admit: 2019-07-24 | Discharge: 2019-07-25 | Disposition: A | Discharge status: Home / Self Care | Payer: Medicaid Other | Attending: Obstetrics and Gynecology | Admitting: Obstetrics and Gynecology

## 2019-07-24 ENCOUNTER — Encounter (HOSPITAL_COMMUNITY): Payer: Self-pay

## 2019-07-24 ENCOUNTER — Other Ambulatory Visit: Payer: Self-pay

## 2019-07-24 DIAGNOSIS — O4703 False labor before 37 completed weeks of gestation, third trimester: Secondary | ICD-10-CM | POA: Diagnosis not present

## 2019-07-24 DIAGNOSIS — Z91040 Latex allergy status: Secondary | ICD-10-CM | POA: Insufficient documentation

## 2019-07-24 DIAGNOSIS — Z888 Allergy status to other drugs, medicaments and biological substances status: Secondary | ICD-10-CM | POA: Insufficient documentation

## 2019-07-24 DIAGNOSIS — O479 False labor, unspecified: Secondary | ICD-10-CM

## 2019-07-24 DIAGNOSIS — Z881 Allergy status to other antibiotic agents status: Secondary | ICD-10-CM | POA: Diagnosis not present

## 2019-07-24 DIAGNOSIS — Z3A35 35 weeks gestation of pregnancy: Secondary | ICD-10-CM | POA: Insufficient documentation

## 2019-07-24 DIAGNOSIS — R109 Unspecified abdominal pain: Secondary | ICD-10-CM | POA: Diagnosis present

## 2019-07-24 DIAGNOSIS — O4700 False labor before 37 completed weeks of gestation, unspecified trimester: Secondary | ICD-10-CM

## 2019-07-24 DIAGNOSIS — Z87891 Personal history of nicotine dependence: Secondary | ICD-10-CM | POA: Insufficient documentation

## 2019-07-24 LAB — URINALYSIS, ROUTINE W REFLEX MICROSCOPIC
Bilirubin Urine: NEGATIVE
Glucose, UA: NEGATIVE mg/dL
Hgb urine dipstick: NEGATIVE
Ketones, ur: 5 mg/dL — AB
Nitrite: NEGATIVE
Protein, ur: NEGATIVE mg/dL
Specific Gravity, Urine: 1.013 (ref 1.005–1.030)
pH: 6 (ref 5.0–8.0)

## 2019-07-24 LAB — CULTURE, BETA STREP (GROUP B ONLY)

## 2019-07-24 MED ORDER — OXYCODONE-ACETAMINOPHEN 5-325 MG PO TABS
1.0000 | ORAL_TABLET | Freq: Once | ORAL | Status: AC
  Administered 2019-07-24: 1 via ORAL
  Filled 2019-07-24: qty 1

## 2019-07-24 MED ORDER — TERBUTALINE SULFATE 1 MG/ML IJ SOLN
0.2500 mg | Freq: Once | INTRAMUSCULAR | Status: AC
  Administered 2019-07-24: 0.25 mg via SUBCUTANEOUS
  Filled 2019-07-24: qty 1

## 2019-07-24 MED ORDER — BETAMETHASONE SOD PHOS & ACET 6 (3-3) MG/ML IJ SUSP
12.0000 mg | Freq: Once | INTRAMUSCULAR | Status: AC
  Administered 2019-07-24: 19:00:00 12 mg via INTRAMUSCULAR
  Filled 2019-07-24: qty 5

## 2019-07-24 NOTE — MAU Note (Signed)
Pt reports constant pelvic pressure and ctx. Here yesterday 1cm and given steroids here for second steroid shot. Reports bloody show as well. Good fetal movement.

## 2019-07-24 NOTE — MAU Provider Note (Addendum)
History     CSN: [id number]  Arrival date and time: 07/24/19 1736   First Provider Initiated Contact with Patient 07/24/19 1854      Chief Complaint  Patient presents with  . Contractions   [name] is a 23 y.o. G2P0101 at 88w0dwho presents to MAU for ctx. Pt was seen yesterday in MAU for preterm contractions and was given BMZ#1, Procardia and fluids with persistent ctx q536m on monitor. Pt states today "I am having so much cramping that I do not know if I am having contractions." Pt reports her ctx were intermittent since her visit to MAU yesterday and her primary concern today is cramping in her lower abdomen and on the sides of her abdomen. Pt reports intermittent tightening of stomach, but per her comment above, is unable to state exactly how often that is occurring.  Pt denies VB, LOF, ctx, decreased FM, vaginal discharge/odor/itching. Pt denies N/V, abdominal pain, constipation, diarrhea, or urinary problems. Pt denies fever, chills, fatigue, sweating or changes in appetite. Pt denies SOB or chest pain. Pt denies dizziness, HA, light-headedness, weakness.  Allergies? Abilify, latex, Pristiq, Rocephin Current medications/supplements? PNV Prenatal care provider? [facility], next appt 07/28/2019   OB History    Gravida  2   Para  1   Term  0   Preterm  1   AB  0   Living  1     SAB      TAB      Ectopic      Multiple      Live Births  1           Past Medical History:  Diagnosis Date  . Anemia   . Anxiety   . Depression   . PTSD (post-traumatic stress disorder)     Past Surgical History:  Procedure Laterality Date  . DRUG INDUCED ENDOSCOPY      Family History  Problem Relation Age of Onset  . Fibromyalgia Mother   . Pancreatitis Father   . Graves' disease Father   . Depression Father     Social History   Tobacco Use  . Smoking status: Former Smoker    Types: Cigarettes    Quit date: 12/10/2018    Years since quitting: 0.6   . Smokeless tobacco: Never Used  Substance Use Topics  . Alcohol use: Never    Frequency: Never  . Drug use: Never    Allergies:  Allergies  Allergen Reactions  . Abilify [Aripiprazole]   . Latex   . Pristiq [Desvenlafaxine]   . Rocephin [Ceftriaxone Sodium In Dextrose] Hives    Medications Prior to Admission  Medication Sig Dispense Refill Last Dose  . Blood Pressure Monitor KIT 1 each by Does not apply route daily. 1 each 0 Past Week at Unknown time  . NIFEdipine (PROCARDIA XL) 30 MG 24 hr tablet Take 1 tablet (30 mg total) by mouth daily. 30 tablet 1 Past Week at Unknown time  . Prenat-FeAsp-Meth-FA-DHA w/o A (PRENATE PIXIE) 10-0.6-0.4-200 MG CAPS Take 1 tablet by mouth daily. 90 capsule 6 07/24/2019 at Unknown time  . butalbital-acetaminophen-caffeine (FIORICET) 50-325-40 MG tablet Take 1 tablet by mouth every 6 (six) hours as needed for headache. 20 tablet 0   . Elastic Bandages & Supports (COMFORT FIT MATERNITY SUPP SM) MISC Wear as directed. 1 each 0   . ondansetron (ZOFRAN) 4 MG tablet Take 1 tablet (4 mg total) by mouth every 6 (six) hours. (Patient not taking: Reported  on 02/12/2019) 12 tablet 0   . promethazine (PHENERGAN) 25 MG tablet Take 1 tablet (25 mg total) by mouth every 6 (six) hours as needed for nausea or vomiting. (Patient not taking: Reported on 02/12/2019) 30 tablet 2     Review of Systems  Constitutional: Negative for chills, diaphoresis, fatigue and fever.  Eyes: Negative for visual disturbance.  Respiratory: Negative for shortness of breath.   Cardiovascular: Negative for chest pain.  Gastrointestinal: Positive for abdominal pain. Negative for constipation, diarrhea, nausea and vomiting.  Genitourinary: Positive for pelvic pain. Negative for dysuria, flank pain, frequency, urgency, vaginal bleeding and vaginal discharge.  Neurological: Negative for dizziness, weakness, light-headedness and headaches.   Physical Exam   Blood pressure 106/64, pulse 79,  temperature 98.6 F (37 C), temperature source Oral, resp. rate 19, height '5\' 4"'$  (1.626 m), weight 66.5 kg, last menstrual period 11/16/2018.  Patient Vitals for the past 24 hrs:  BP Temp Temp src Pulse Resp Height Weight  07/24/19 2052 106/64 98.6 F (37 C) Oral 79 19 - -  07/24/19 1758 (!) 122/57 98 F (36.7 C) - (!) 106 18 '5\' 4"'$  (1.626 m) 66.5 kg   Physical Exam  Constitutional: She is oriented to person, place, and time. She appears well-developed and well-nourished. No distress.  HENT:  Head: Normocephalic and atraumatic.  Respiratory: Effort normal.  GI: Soft. She exhibits no distension and no mass. There is no abdominal tenderness. There is no rebound and no guarding.  Neurological: She is alert and oriented to person, place, and time.  Skin: Skin is warm and dry. She is not diaphoretic.  Psychiatric: She has a normal mood and affect. Her behavior is normal. Judgment and thought content normal.   Results for orders placed or performed during the hospital encounter of 07/24/19 (from the past 24 hour(s))  Urinalysis, Routine w reflex microscopic     Status: Abnormal   Collection Time: 07/24/19  6:21 PM  Result Value Ref Range   Color, Urine YELLOW YELLOW   APPearance CLEAR CLEAR   Specific Gravity, Urine 1.013 1.005 - 1.030   pH 6.0 5.0 - 8.0   Glucose, UA NEGATIVE NEGATIVE mg/dL   Hgb urine dipstick NEGATIVE NEGATIVE   Bilirubin Urine NEGATIVE NEGATIVE   Ketones, ur 5 (A) NEGATIVE mg/dL   Protein, ur NEGATIVE NEGATIVE mg/dL   Nitrite NEGATIVE NEGATIVE   Leukocytes,Ua TRACE (A) NEGATIVE   RBC / HPF 0-5 0 - 5 RBC/hpf   WBC, UA 11-20 0 - 5 WBC/hpf   Bacteria, UA RARE (A) NONE SEEN   Squamous Epithelial / LPF 0-5 0 - 5   Mucus PRESENT    MAU Course  Procedures  MDM -preterm contractions -UA: 5ketones/trace leuks/rare bacteria, sending urine for culture Dilation: 1.5 Effacement (%): 50 Cervical Position: Posterior Station: Ballotable Presentation: Vertex Exam by::  Vernice Jefferson, NP -BMZ#2 given today -EFM: reactive       -baseline: 135       -variability: moderate       -accels: present, 15x15       -decels: absent       -TOCO: irritability/irregular ctx -recheck '@1'$ .5hrs: 1.5/50/VTX/0 (cervical exam on 07/23/2019 was 1/50/-2) -spoke with Dr. Elly Modena '@849PM'$ , can give terbutaline 0.'25mg'$ , and if no additional cervical change, pt can be discharged to home even if contractions persist. -terbutaline ordered, care transferred to Hansel Feinstein, CNM '@953PM'$  Carolyne Fiscal, Gerrie Nordmann, NP  9:53 PM 07/24/2019  Orders Placed This Encounter  Procedures  . Culture, OB Urine  Standing Status:   Standing    Number of Occurrences:   1  . Urinalysis, Routine w reflex microscopic    Standing Status:   Standing    Number of Occurrences:   1   Meds ordered this encounter  Medications  . betamethasone acetate-betamethasone sodium phosphate (CELESTONE) injection 12 mg  . terbutaline (BRETHINE) injection 0.25 mg   Assessment and Plan   Assumed care from other provider No change in cervix over time Contractions did not slow down with Terbutaline Offered her Procardia and she refused, stating it did not work Ambulance person given (1 tab) to see if analgesic effect might slow them also, or at least give her some pain relief SHe states she did not get any relief from [place] with Dr Elly Modena to verify plan Pt informed she is not changing her cervix, and we cannot admit her for augmentation due to preterm status She and FOB were very upset.  I had a long discussion of persistent prodromal labor Also reviewed differences in cervical exams between providers She requested shot of Stadol because it helped her sleep yesterday Gave her only 0.'5mg'$  because of giving a single Percocet earlier Labor precautions reviewed Discussed may want to make next visit in person instead of virtual due to this issue Seabron Spates, CNM

## 2019-07-25 ENCOUNTER — Encounter: Payer: Self-pay | Admitting: Obstetrics and Gynecology

## 2019-07-25 DIAGNOSIS — R8271 Bacteriuria: Secondary | ICD-10-CM | POA: Insufficient documentation

## 2019-07-25 LAB — GC/CHLAMYDIA PROBE AMP (~~LOC~~) NOT AT ARMC
Chlamydia: NEGATIVE
Comment: NEGATIVE
Comment: NORMAL
Neisseria Gonorrhea: NEGATIVE

## 2019-07-25 MED ORDER — OXYCODONE-ACETAMINOPHEN 5-325 MG PO TABS: 1.0000 | ORAL_TABLET | Freq: Four times a day (QID) | ORAL | 0 refills | Status: DC | PRN

## 2019-07-25 MED ORDER — BUTORPHANOL TARTRATE 1 MG/ML IJ SOLN
0.5000 mg | Freq: Once | INTRAMUSCULAR | Status: AC
  Administered 2019-07-25: 0.5 mg via INTRAMUSCULAR
  Filled 2019-07-25: qty 1

## 2019-07-25 NOTE — Discharge Instructions (Signed)
Preterm Labor and Birth Information ° °The normal length of a pregnancy is 39-41 weeks. Preterm labor is when labor starts before 37 completed weeks of pregnancy. °What are the risk factors for preterm labor? °Preterm labor is more likely to occur in women who: °· Have certain infections during pregnancy such as a bladder infection, sexually transmitted infection, or infection inside the uterus (chorioamnionitis). °· Have a shorter-than-normal cervix. °· Have gone into preterm labor before. °· Have had surgery on their cervix. °· Are younger than age 17 or older than age 35. °· Are African American. °· Are pregnant with twins or multiple babies (multiple gestation). °· Take street drugs or smoke while pregnant. °· Do not gain enough weight while pregnant. °· Became pregnant shortly after having been pregnant. °What are the symptoms of preterm labor? °Symptoms of preterm labor include: °· Cramps similar to those that can happen during a menstrual period. The cramps may happen with diarrhea. °· Pain in the abdomen or lower back. °· Regular uterine contractions that may feel like tightening of the abdomen. °· A feeling of increased pressure in the pelvis. °· Increased watery or bloody mucus discharge from the vagina. °· Water breaking (ruptured amniotic sac). °Why is it important to recognize signs of preterm labor? °It is important to recognize signs of preterm labor because babies who are born prematurely may not be fully developed. This can put them at an increased risk for: °· Long-term (chronic) heart and lung problems. °· Difficulty immediately after birth with regulating body systems, including blood sugar, body temperature, heart rate, and breathing rate. °· Bleeding in the brain. °· Cerebral palsy. °· Learning difficulties. °· Death. °These risks are highest for babies who are born before 34 weeks of pregnancy. °How is preterm labor treated? °Treatment depends on the length of your pregnancy, your condition,  and the health of your baby. It may involve: °· Having a stitch (suture) placed in your cervix to prevent your cervix from opening too early (cerclage). °· Taking or being given medicines, such as: °? Hormone medicines. These may be given early in pregnancy to help support the pregnancy. °? Medicine to stop contractions. °? Medicines to help mature the baby’s lungs. These may be prescribed if the risk of delivery is high. °? Medicines to prevent your baby from developing cerebral palsy. °If the labor happens before 34 weeks of pregnancy, you may need to stay in the hospital. °What should I do if I think I am in preterm labor? °If you think that you are going into preterm labor, call your health care provider right away. °How can I prevent preterm labor in future pregnancies? °To increase your chance of having a full-term pregnancy: °· Do not use any tobacco products, such as cigarettes, chewing tobacco, and e-cigarettes. If you need help quitting, ask your health care provider. °· Do not use street drugs or medicines that have not been prescribed to you during your pregnancy. °· Talk with your health care provider before taking any herbal supplements, even if you have been taking them regularly. °· Make sure you gain a healthy amount of weight during your pregnancy. °· Watch for infection. If you think that you might have an infection, get it checked right away. °· Make sure to tell your health care provider if you have gone into preterm labor before. °This information is not intended to replace advice given to you by your health care provider. Make sure you discuss any questions you have with your   health care provider. °Document Released: 12/09/2003 Document Revised: 01/10/2019 Document Reviewed: 02/09/2016 °Elsevier Patient Education © 2020 Elsevier Inc. ° °

## 2019-07-27 ENCOUNTER — Encounter (HOSPITAL_COMMUNITY): Payer: Self-pay

## 2019-07-27 ENCOUNTER — Inpatient Hospital Stay (HOSPITAL_COMMUNITY)
Admission: AD | Admit: 2019-07-27 | Discharge: 2019-07-27 | Disposition: A | Discharge status: Home / Self Care | Payer: Medicaid Other | Attending: Obstetrics and Gynecology | Admitting: Obstetrics and Gynecology

## 2019-07-27 ENCOUNTER — Other Ambulatory Visit: Payer: Self-pay

## 2019-07-27 DIAGNOSIS — O09213 Supervision of pregnancy with history of pre-term labor, third trimester: Secondary | ICD-10-CM | POA: Insufficient documentation

## 2019-07-27 DIAGNOSIS — Z881 Allergy status to other antibiotic agents status: Secondary | ICD-10-CM | POA: Diagnosis not present

## 2019-07-27 DIAGNOSIS — O358XX0 Maternal care for other (suspected) fetal abnormality and damage, not applicable or unspecified: Secondary | ICD-10-CM | POA: Insufficient documentation

## 2019-07-27 DIAGNOSIS — Z87891 Personal history of nicotine dependence: Secondary | ICD-10-CM | POA: Diagnosis not present

## 2019-07-27 DIAGNOSIS — O26893 Other specified pregnancy related conditions, third trimester: Secondary | ICD-10-CM | POA: Insufficient documentation

## 2019-07-27 DIAGNOSIS — O4703 False labor before 37 completed weeks of gestation, third trimester: Secondary | ICD-10-CM | POA: Diagnosis not present

## 2019-07-27 DIAGNOSIS — O350XX0 Maternal care for (suspected) central nervous system malformation in fetus, not applicable or unspecified: Secondary | ICD-10-CM | POA: Diagnosis not present

## 2019-07-27 DIAGNOSIS — Z888 Allergy status to other drugs, medicaments and biological substances status: Secondary | ICD-10-CM | POA: Diagnosis not present

## 2019-07-27 DIAGNOSIS — Z3A35 35 weeks gestation of pregnancy: Secondary | ICD-10-CM

## 2019-07-27 DIAGNOSIS — R8271 Bacteriuria: Secondary | ICD-10-CM | POA: Diagnosis not present

## 2019-07-27 DIAGNOSIS — B373 Candidiasis of vulva and vagina: Secondary | ICD-10-CM | POA: Diagnosis not present

## 2019-07-27 DIAGNOSIS — Z91040 Latex allergy status: Secondary | ICD-10-CM | POA: Insufficient documentation

## 2019-07-27 DIAGNOSIS — B3731 Acute candidiasis of vulva and vagina: Secondary | ICD-10-CM

## 2019-07-27 DIAGNOSIS — O98813 Other maternal infectious and parasitic diseases complicating pregnancy, third trimester: Secondary | ICD-10-CM | POA: Insufficient documentation

## 2019-07-27 DIAGNOSIS — IMO0002 Reserved for concepts with insufficient information to code with codable children: Secondary | ICD-10-CM

## 2019-07-27 DIAGNOSIS — R109 Unspecified abdominal pain: Secondary | ICD-10-CM | POA: Diagnosis present

## 2019-07-27 LAB — URINALYSIS, ROUTINE W REFLEX MICROSCOPIC
Bilirubin Urine: NEGATIVE
Glucose, UA: NEGATIVE mg/dL
Ketones, ur: NEGATIVE mg/dL
Nitrite: POSITIVE — AB
Protein, ur: NEGATIVE mg/dL
Specific Gravity, Urine: 1.008 (ref 1.005–1.030)
pH: 6 (ref 5.0–8.0)

## 2019-07-27 MED ORDER — LACTATED RINGERS IV BOLUS
1000.0000 mL | Freq: Once | INTRAVENOUS | Status: AC
  Administered 2019-07-27: 1000 mL via INTRAVENOUS

## 2019-07-27 MED ORDER — FLUCONAZOLE 150 MG PO TABS: ORAL_TABLET | ORAL | 0 refills | Status: DC

## 2019-07-27 NOTE — MAU Provider Note (Signed)
Chief Complaint:  Contractions   First Provider Initiated Contact with Patient 07/27/19 1544      HPI: Danielle Carter is a 23 y.o. G2P0101 at 43w3dby LMP who presents to maternity admissions reporting painful cramping/contractions every 4 minutes. She reports the pain is low in her abdomen and radiates to her low back and mid abdomen.  She also reports vaginal discharge with itching. There are no other associated symptoms. She has been treated for contractions in MAU with Procardia, terbuline, and IV fluids but reports when she goes home the contractions begin again. She reports good fetal movement.  HPI  Past Medical History: Past Medical History:  Diagnosis Date  . Anemia   . Anxiety   . Depression   . PTSD (post-traumatic stress disorder)     Past obstetric history: OB History  Gravida Para Term Preterm AB Living  2 1 0 1 0 1  SAB TAB Ectopic Multiple Live Births          1    # Outcome Date GA Lbr Len/2nd Weight Sex Delivery Anes PTL Lv  2 Current           1 Preterm 08/21/14 [id number] g F Vag-Spont EPI  LIV    Past Surgical History: Past Surgical History:  Procedure Laterality Date  . DRUG INDUCED ENDOSCOPY      Family History: Family History  Problem Relation Age of Onset  . Fibromyalgia Mother   . Pancreatitis Father   . Graves' disease Father   . Depression Father     Social History: Social History   Tobacco Use  . Smoking status: Former Smoker    Types: Cigarettes    Quit date: 12/10/2018    Years since quitting: 0.6  . Smokeless tobacco: Never Used  Substance Use Topics  . Alcohol use: Never    Frequency: Never  . Drug use: Never    Allergies:  Allergies  Allergen Reactions  . Abilify [Aripiprazole]   . Latex   . Pristiq [Desvenlafaxine]   . Rocephin [Ceftriaxone Sodium In Dextrose] Hives    Meds:  Medications Prior to Admission  Medication Sig Dispense Refill Last Dose  . Blood Pressure Monitor KIT 1 each by Does not apply route  daily. 1 each 0   . butalbital-acetaminophen-caffeine (FIORICET) 50-325-40 MG tablet Take 1 tablet by mouth every 6 (six) hours as needed for headache. 20 tablet 0   . Elastic Bandages & Supports (COMFORT FIT MATERNITY SUPP SM) MISC Wear as directed. 1 each 0   . NIFEdipine (PROCARDIA XL) 30 MG 24 hr tablet Take 1 tablet (30 mg total) by mouth daily. 30 tablet 1   . ondansetron (ZOFRAN) 4 MG tablet Take 1 tablet (4 mg total) by mouth every 6 (six) hours. (Patient not taking: Reported on 02/12/2019) 12 tablet 0   . oxyCODONE-acetaminophen (PERCOCET/ROXICET) 5-325 MG tablet Take 1-2 tablets by mouth every 6 (six) hours as needed. 15 tablet 0   . Prenat-FeAsp-Meth-FA-DHA w/o A (PRENATE PIXIE) 10-0.6-0.4-200 MG CAPS Take 1 tablet by mouth daily. 90 capsule 6   . promethazine (PHENERGAN) 25 MG tablet Take 1 tablet (25 mg total) by mouth every 6 (six) hours as needed for nausea or vomiting. (Patient not taking: Reported on 02/12/2019) 30 tablet 2     ROS:  Review of Systems  Constitutional: Negative for chills, fatigue and fever.  Eyes: Negative for visual disturbance.  Respiratory: Negative for shortness of breath.   Cardiovascular: Negative for  chest pain.  Gastrointestinal: Negative for abdominal pain, nausea and vomiting.  Genitourinary: Negative for difficulty urinating, dysuria, flank pain, pelvic pain, vaginal bleeding, vaginal discharge and vaginal pain.  Neurological: Negative for dizziness and headaches.  Psychiatric/Behavioral: Negative.      I have reviewed patient's Past Medical Hx, Surgical Hx, Family Hx, Social Hx, medications and allergies.   Physical Exam   Patient Vitals for the past 24 hrs:  BP Temp Temp src Pulse Resp SpO2  07/27/19 1812 (!) 102/57 - - - 16 -  07/27/19 1540 127/82 - - 88 - 98 %  07/27/19 1525 139/71 98.3 F (36.8 C) Oral 94 20 100 %   Constitutional: Well-developed, well-nourished female in no acute distress.  Cardiovascular: normal rate Respiratory:  normal effort GI: Abd soft, non-tender, gravid appropriate for gestational age.  MS: Extremities nontender, no edema, normal ROM Neurologic: Alert and oriented x 4.  GU: Neg CVAT.   Dilation: 1 Effacement (%): 50 Station: -3 Presentation: Vertex Exam by:: leftwich kirby cnm  FHT:  Baseline 135 , moderate variability, accelerations present, no decelerations Contractions: difficult to assess contractions with pt movement, readjusted toco and pt repositioned   Labs: No results found for this or any previous visit (from the past 24 hour(s)). O/Positive/-- (05/13 1522)  Imaging:  No results found.  MAU Course/MDM: Orders Placed This Encounter  Procedures  . Urinalysis, Routine w reflex microscopic  . Discharge patient    Meds ordered this encounter  Medications  . lactated ringers bolus 1,000 mL  . fluconazole (DIFLUCAN) 150 MG tablet    Sig: Take one tablet now and one in 3 days    Dispense:  2 tablet    Refill:  0    Order Specific Question:   Supervising Provider    Answer:   Donnamae Jude [[id number]]     NST reviewed and reactive Cervix unchanged from previous exam.  Unable to see contractions with pt movement. Pt encouraged to remain still and not tense or touch monitor when contracting for better visualization.  Plan to recheck cervix in 2 hours. Cervix unchanged in 2+ hours in MAU.  Contractions traced on toco are irregular and mild to palpation.  No evidence of preterm labor today.  Discharge on exam c/w yeast today and pt reports itching.  Diflucan Rx sent to pharmacy.  Urine today sent for culture.  Pt has virtual prenatal visit scheduled tomorrow, message sent to [facility] to change to in person visit for further follow up.  Pt to return to MAU as needed for signs of labor or emergencies.    Assessment: 1. Threatened preterm labor, third trimester   2. GBS bacteriuria   3. Choroid plexus cyst of fetus on prenatal ultrasound   4. Current pregnancy with history of  pre-term labor in third trimester   5. Vaginal candidiasis     Plan: Discharge home Labor precautions and fetal kick counts Follow-up Information    [place] Follow up.   Why: The office will call you to change your virtual visit Monday, 07/28/19, to an in person office visit.  Return to MAU as needed for signs of labor or emergencies. Contact information: [place] [address] [phone]         Allergies as of 07/27/2019      Reactions   Abilify [aripiprazole]    Latex    Pristiq [desvenlafaxine]    Rocephin [ceftriaxone Sodium In Dextrose] Hives  Medication List    TAKE these medications   Blood Pressure Monitor Kit 1 each by Does not apply route daily.   butalbital-acetaminophen-caffeine 50-325-40 MG tablet Commonly known as: FIORICET Take 1 tablet by mouth every 6 (six) hours as needed for headache.   Abiquiu Supp Sm Misc Wear as directed.   fluconazole 150 MG tablet Commonly known as: DIFLUCAN Take one tablet now and one in 3 days   NIFEdipine 30 MG 24 hr tablet Commonly known as: Procardia XL Take 1 tablet (30 mg total) by mouth daily.   ondansetron 4 MG tablet Commonly known as: ZOFRAN Take 1 tablet (4 mg total) by mouth every 6 (six) hours.   oxyCODONE-acetaminophen 5-325 MG tablet Commonly known as: PERCOCET/ROXICET Take 1-2 tablets by mouth every 6 (six) hours as needed.   Prenate Pixie 10-0.6-0.4-200 MG Caps Take 1 tablet by mouth daily.   promethazine 25 MG tablet Commonly known as: PHENERGAN Take 1 tablet (25 mg total) by mouth every 6 (six) hours as needed for nausea or vomiting.       Fatima Blank Certified Nurse-Midwife 07/27/2019 6:14 PM

## 2019-07-27 NOTE — MAU Note (Signed)
Danielle Carter is a 23 y.o. at [redacted]w[redacted]d here in MAU reporting: worsening contractions, they are every 4 minutes. No bleeding or LOF. +FM  Onset of complaint: ongoing but worse today  Pain score: 8/10  Vitals:   07/27/19 1525  BP: 139/71  Pulse: 94  Resp: 20  Temp: 98.3 F (36.8 C)  SpO2: 100%      Lab orders placed from triage: UA, unable to give sample

## 2019-07-28 ENCOUNTER — Encounter: Payer: Self-pay | Admitting: Obstetrics & Gynecology

## 2019-07-28 ENCOUNTER — Ambulatory Visit (INDEPENDENT_AMBULATORY_CARE_PROVIDER_SITE_OTHER): Payer: Medicaid Other | Admitting: Obstetrics & Gynecology

## 2019-07-28 VITALS — BP 116/72 | HR 94 | Wt 151.0 lb

## 2019-07-28 DIAGNOSIS — O09213 Supervision of pregnancy with history of pre-term labor, third trimester: Secondary | ICD-10-CM

## 2019-07-28 DIAGNOSIS — O0990 Supervision of high risk pregnancy, unspecified, unspecified trimester: Secondary | ICD-10-CM

## 2019-07-28 DIAGNOSIS — Z3A35 35 weeks gestation of pregnancy: Secondary | ICD-10-CM

## 2019-07-28 NOTE — Patient Instructions (Signed)
Preterm Labor and Birth Information °Pregnancy normally lasts 39-41 weeks. Preterm labor is when labor starts early. It starts before you have been pregnant for 37 whole weeks. °What are the risk factors for preterm labor? °Preterm labor is more likely to occur in women who: °· Have an infection while pregnant. °· Have a cervix that is short. °· Have gone into preterm labor before. °· Have had surgery on their cervix. °· Are younger than age 21. °· Are older than age 35. °· Are African American. °· Are pregnant with two or more babies. °· Take street drugs while pregnant. °· Smoke while pregnant. °· Do not gain enough weight while pregnant. °· Got pregnant right after another pregnancy. °What are the symptoms of preterm labor? °Symptoms of preterm labor include: °· Cramps. The cramps may feel like the cramps some women get during their period. The cramps may happen with watery poop (diarrhea). °· Pain in the belly (abdomen). °· Pain in the lower back. °· Regular contractions or tightening. It may feel like your belly is getting tighter. °· Pressure in the lower belly that seems to get stronger. °· More fluid (discharge) leaking from the vagina. The fluid may be watery or bloody. °· Water breaking. °Why is it important to notice signs of preterm labor? °Babies who are born early may not be fully developed. They have a higher chance for: °· Long-term heart problems. °· Long-term lung problems. °· Trouble controlling body systems, like breathing. °· Bleeding in the brain. °· A condition called cerebral palsy. °· Learning difficulties. °· Death. °These risks are highest for babies who are born before 34 weeks of pregnancy. °How is preterm labor treated? °Treatment depends on: °· How long you were pregnant. °· Your condition. °· The health of your baby. °Treatment may involve: °· Having a stitch (suture) placed in your cervix. When you give birth, your cervix opens so the baby can come out. The stitch keeps the cervix  from opening too soon. °· Staying at the hospital. °· Taking or getting medicines, such as: °? Hormone medicines. °? Medicines to stop contractions. °? Medicines to help the baby’s lungs develop. °? Medicines to prevent your baby from having cerebral palsy. °What should I do if I am in preterm labor? °If you think you are going into labor too soon, call your doctor right away. °How can I prevent preterm labor? °· Do not use any tobacco products. °? Examples of these are cigarettes, chewing tobacco, and e-cigarettes. °? If you need help quitting, ask your doctor. °· Do not use street drugs. °· Do not use any medicines unless you ask your doctor if they are safe for you. °· Talk with your doctor before taking any herbal supplements. °· Make sure you gain enough weight. °· Watch for infection. If you think you might have an infection, get it checked right away. °· If you have gone into preterm labor before, tell your doctor. °This information is not intended to replace advice given to you by your health care provider. Make sure you discuss any questions you have with your health care provider. °Document Released: 12/15/2008 Document Revised: 01/10/2019 Document Reviewed: 02/09/2016 °Elsevier Patient Education © 2020 [employer] ° °

## 2019-07-28 NOTE — Progress Notes (Signed)
   PRENATAL VISIT NOTE  Subjective:  Danielle Carter is a 23 y.o. G2P0101 at [redacted]w[redacted]d being seen today for ongoing prenatal care.  She is currently monitored for the following issues for this high-risk pregnancy and has Supervision of high risk pregnancy, antepartum; History of preterm labor, current pregnancy; Asymptomatic bacteriuria during pregnancy; Low grade squamous intraepith lesion on cytologic smear cervix (lgsil); Choroid plexus cyst of fetus on prenatal ultrasound; and GBS bacteriuria on their problem list.  Patient reports occasional contractions.  Contractions: Irritability. Vag. Bleeding: Small.  Movement: Present. Denies leaking of fluid.   The following portions of the patient's history were reviewed and updated as appropriate: allergies, current medications, past family history, past medical history, past social history, past surgical history and problem list.   Objective:   Vitals:   07/28/19 1624  BP: 116/72  Pulse: 94  Weight: 151 lb (68.5 kg)    Fetal Status: Fetal Heart Rate (bpm): 150   Movement: Present  Presentation: Vertex  General:  Alert, oriented and cooperative. Patient is in no acute distress.  Skin: Skin is warm and dry. No rash noted.   Cardiovascular: Normal heart rate noted  Respiratory: Normal respiratory effort, no problems with respiration noted  Abdomen: Soft, gravid, appropriate for gestational age.  Pain/Pressure: Present     Pelvic: Cervical exam performed Dilation: 1 Effacement (%): 40 Station: -3  Extremities: Normal range of motion.  Edema: None  Mental Status: Normal mood and affect. Normal behavior. Normal judgment and thought content.   Assessment and Plan:  Pregnancy: G2P0101 at [redacted]w[redacted]d 1. Supervision of high risk pregnancy, antepartum Stable cx exam, [place]  2. Current pregnancy with history of pre-term labor in third trimester   Preterm labor symptoms and general obstetric precautions including but not limited to vaginal bleeding,  contractions, leaking of fluid and fetal movement were reviewed in detail with the patient. Please refer to After Visit Summary for other counseling recommendations.   Return in about 1 week (around 08/04/2019).  No future appointments.  Emeterio Reeve, MD

## 2019-07-28 NOTE — Progress Notes (Signed)
ROB  CC: Pain and pressure wants cervix exam  *Pt had MAU visit yesterday pt notes blood in discharge  Had NST yesterday was reactive.

## 2019-08-02 LAB — SUSCEPTIBILITY RESULT

## 2019-08-02 LAB — SUSCEPTIBILITY, AER + ANAEROB

## 2019-08-04 ENCOUNTER — Other Ambulatory Visit: Payer: Self-pay | Admitting: Obstetrics and Gynecology

## 2019-08-04 MED ORDER — NITROFURANTOIN MONOHYD MACRO 100 MG PO CAPS: 100.0000 mg | ORAL_CAPSULE | Freq: Two times a day (BID) | ORAL | 1 refills | Status: DC

## 2019-08-06 LAB — CULTURE, OB URINE: Culture: >=100000 — AB

## 2019-08-07 ENCOUNTER — Encounter: Payer: Medicaid Other | Admitting: Obstetrics and Gynecology

## 2019-08-11 ENCOUNTER — Ambulatory Visit (INDEPENDENT_AMBULATORY_CARE_PROVIDER_SITE_OTHER): Payer: Medicaid Other | Admitting: Obstetrics and Gynecology

## 2019-08-11 ENCOUNTER — Other Ambulatory Visit: Payer: Self-pay

## 2019-08-11 ENCOUNTER — Encounter: Payer: Self-pay | Admitting: Obstetrics and Gynecology

## 2019-08-11 VITALS — BP 110/72 | HR 87 | Wt 149.6 lb

## 2019-08-11 DIAGNOSIS — Z3A37 37 weeks gestation of pregnancy: Secondary | ICD-10-CM

## 2019-08-11 DIAGNOSIS — O09213 Supervision of pregnancy with history of pre-term labor, third trimester: Secondary | ICD-10-CM

## 2019-08-11 DIAGNOSIS — R87612 Low grade squamous intraepithelial lesion on cytologic smear of cervix (LGSIL): Secondary | ICD-10-CM

## 2019-08-11 DIAGNOSIS — O0993 Supervision of high risk pregnancy, unspecified, third trimester: Secondary | ICD-10-CM

## 2019-08-11 DIAGNOSIS — R8271 Bacteriuria: Secondary | ICD-10-CM

## 2019-08-11 DIAGNOSIS — O0990 Supervision of high risk pregnancy, unspecified, unspecified trimester: Secondary | ICD-10-CM

## 2019-08-11 NOTE — Progress Notes (Signed)
   PRENATAL VISIT NOTE  Subjective:  Danielle Carter is a 23 y.o. G2P0101 at [redacted]w[redacted]d being seen today for ongoing prenatal care.  She is currently monitored for the following issues for this low-risk pregnancy and has Supervision of high risk pregnancy, antepartum; History of preterm labor, current pregnancy; Asymptomatic bacteriuria during pregnancy; Low grade squamous intraepith lesion on cytologic smear cervix (lgsil); Choroid plexus cyst of fetus on prenatal ultrasound; and GBS bacteriuria on their problem list.  Patient reports no complaints.  Contractions: Irregular. Vag. Bleeding: None.  Movement: Present. Denies leaking of fluid.   The following portions of the patient's history were reviewed and updated as appropriate: allergies, current medications, past family history, past medical history, past social history, past surgical history and problem list.   Objective:   Vitals:   08/11/19 0840  BP: 110/72  Pulse: 87  Weight: 149 lb 9.6 oz (67.9 kg)    Fetal Status: Fetal Heart Rate (bpm): 140 Fundal Height: 36 cm Movement: Present  Presentation: Vertex  General:  Alert, oriented and cooperative. Patient is in no acute distress.  Skin: Skin is warm and dry. No rash noted.   Cardiovascular: Normal heart rate noted  Respiratory: Normal respiratory effort, no problems with respiration noted  Abdomen: Soft, gravid, appropriate for gestational age.  Pain/Pressure: Present     Pelvic: Cervical exam performed Dilation: 1.5 Effacement (%): 40 Station: -3  Extremities: Normal range of motion.  Edema: Trace  Mental Status: Normal mood and affect. Normal behavior. Normal judgment and thought content.   Assessment and Plan:  Pregnancy: G2P0101 at [redacted]w[redacted]d 1. Supervision of high risk pregnancy, antepartum Patient is doing well without complaints Patient completed treatment for UTI  2. Current pregnancy with history of pre-term labor in third trimester Full term  3. GBS bacteriuria  Prophylaxis in labor  4. Low grade squamous intraepith lesion on cytologic smear cervix (lgsil) Repeat pap smear in 1 year  Term labor symptoms and general obstetric precautions including but not limited to vaginal bleeding, contractions, leaking of fluid and fetal movement were reviewed in detail with the patient. Please refer to After Visit Summary for other counseling recommendations.   Return in about 1 week (around 08/18/2019) for Virtual, Low risk, ROB.  No future appointments.  Mora Bellman, MD

## 2019-08-11 NOTE — Progress Notes (Signed)
Pt is here for ROB. [redacted]w[redacted]d.

## 2019-08-13 ENCOUNTER — Inpatient Hospital Stay (HOSPITAL_COMMUNITY): Payer: Medicaid Other | Admitting: Anesthesiology

## 2019-08-13 ENCOUNTER — Inpatient Hospital Stay (HOSPITAL_COMMUNITY)
Admission: AD | Admit: 2019-08-13 | Discharge: 2019-08-14 | DRG: 807 | Disposition: A | Discharge status: Home / Self Care | Payer: Medicaid Other | Source: Ambulatory Visit | Attending: Family Medicine | Admitting: Family Medicine

## 2019-08-13 ENCOUNTER — Encounter (HOSPITAL_COMMUNITY): Payer: Self-pay | Admitting: *Deleted

## 2019-08-13 ENCOUNTER — Other Ambulatory Visit: Payer: Self-pay

## 2019-08-13 DIAGNOSIS — R87612 Low grade squamous intraepithelial lesion on cytologic smear of cervix (LGSIL): Secondary | ICD-10-CM | POA: Diagnosis present

## 2019-08-13 DIAGNOSIS — R8271 Bacteriuria: Secondary | ICD-10-CM | POA: Diagnosis present

## 2019-08-13 DIAGNOSIS — O99824 Streptococcus B carrier state complicating childbirth: Secondary | ICD-10-CM | POA: Diagnosis not present

## 2019-08-13 DIAGNOSIS — Z141 Cystic fibrosis carrier: Secondary | ICD-10-CM

## 2019-08-13 DIAGNOSIS — O4292 Full-term premature rupture of membranes, unspecified as to length of time between rupture and onset of labor: Principal | ICD-10-CM | POA: Diagnosis present

## 2019-08-13 DIAGNOSIS — O0990 Supervision of high risk pregnancy, unspecified, unspecified trimester: Secondary | ICD-10-CM

## 2019-08-13 DIAGNOSIS — Z3A37 37 weeks gestation of pregnancy: Secondary | ICD-10-CM

## 2019-08-13 DIAGNOSIS — Z30017 Encounter for initial prescription of implantable subdermal contraceptive: Secondary | ICD-10-CM | POA: Diagnosis not present

## 2019-08-13 DIAGNOSIS — O09219 Supervision of pregnancy with history of pre-term labor, unspecified trimester: Secondary | ICD-10-CM

## 2019-08-13 DIAGNOSIS — O99891 Other specified diseases and conditions complicating pregnancy: Secondary | ICD-10-CM | POA: Diagnosis present

## 2019-08-13 DIAGNOSIS — O9902 Anemia complicating childbirth: Secondary | ICD-10-CM | POA: Diagnosis not present

## 2019-08-13 DIAGNOSIS — Z975 Presence of (intrauterine) contraceptive device: Secondary | ICD-10-CM

## 2019-08-13 DIAGNOSIS — Z87891 Personal history of nicotine dependence: Secondary | ICD-10-CM

## 2019-08-13 DIAGNOSIS — D649 Anemia, unspecified: Secondary | ICD-10-CM | POA: Diagnosis not present

## 2019-08-13 DIAGNOSIS — Z20828 Contact with and (suspected) exposure to other viral communicable diseases: Secondary | ICD-10-CM | POA: Diagnosis present

## 2019-08-13 DIAGNOSIS — O4202 Full-term premature rupture of membranes, onset of labor within 24 hours of rupture: Secondary | ICD-10-CM | POA: Diagnosis not present

## 2019-08-13 LAB — COMPREHENSIVE METABOLIC PANEL
ALT: 12 U/L (ref 0–44)
AST: 20 U/L (ref 15–41)
Albumin: 3 g/dL — ABNORMAL LOW (ref 3.5–5.0)
Alkaline Phosphatase: 184 U/L — ABNORMAL HIGH (ref 38–126)
Anion gap: 12 (ref 5–15)
BUN: <5 mg/dL — ABNORMAL LOW (ref 6–20)
CO2: 18 mmol/L — ABNORMAL LOW (ref 22–32)
Calcium: 9.1 mg/dL (ref 8.9–10.3)
Chloride: 106 mmol/L (ref 98–111)
Creatinine, Ser: 0.62 mg/dL (ref 0.44–1.00)
GFR calc Af Amer: >60 mL/min (ref >60)
GFR calc non Af Amer: >60 mL/min (ref >60)
Glucose, Bld: 85 mg/dL (ref 70–99)
Potassium: 3.4 mmol/L — ABNORMAL LOW (ref 3.5–5.1)
Sodium: 136 mmol/L (ref 135–145)
Total Bilirubin: 0.6 mg/dL (ref 0.3–1.2)
Total Protein: 6.1 g/dL — ABNORMAL LOW (ref 6.5–8.1)

## 2019-08-13 LAB — RPR: RPR Ser Ql: NONREACTIVE

## 2019-08-13 LAB — POCT FERN TEST: POCT Fern Test: POSITIVE

## 2019-08-13 LAB — CBC
HCT: 35.1 % — ABNORMAL LOW (ref 36.0–46.0)
Hemoglobin: 11.6 g/dL — ABNORMAL LOW (ref 12.0–15.0)
MCH: 28.1 pg (ref 26.0–34.0)
MCHC: 33 g/dL (ref 30.0–36.0)
MCV: 85 fL (ref 80.0–100.0)
Platelets: 218 10*3/uL (ref 150–400)
RBC: 4.13 MIL/uL (ref 3.87–5.11)
RDW: 13.4 % (ref 11.5–15.5)
WBC: 10.4 10*3/uL (ref 4.0–10.5)
nRBC: 0 % (ref 0.0–0.2)

## 2019-08-13 LAB — TYPE AND SCREEN
ABO/RH(D): O POS
Antibody Screen: NEGATIVE

## 2019-08-13 LAB — ABO/RH: ABO/RH(D): O POS

## 2019-08-13 LAB — SARS CORONAVIRUS 2 (TAT 6-24 HRS): SARS Coronavirus 2: NEGATIVE

## 2019-08-13 MED ORDER — SENNOSIDES-DOCUSATE SODIUM 8.6-50 MG PO TABS
2.0000 | ORAL_TABLET | ORAL | Status: DC
  Administered 2019-08-13: 2 via ORAL
  Filled 2019-08-13: qty 2

## 2019-08-13 MED ORDER — SOD CITRATE-CITRIC ACID 500-334 MG/5ML PO SOLN: 30.0000 mL | ORAL | Status: DC | PRN

## 2019-08-13 MED ORDER — OXYTOCIN BOLUS FROM INFUSION: 500.0000 mL | Freq: Once | INTRAVENOUS | Status: DC

## 2019-08-13 MED ORDER — DIPHENHYDRAMINE HCL 50 MG/ML IJ SOLN: 12.5000 mg | INTRAMUSCULAR | Status: DC | PRN

## 2019-08-13 MED ORDER — OXYCODONE-ACETAMINOPHEN 5-325 MG PO TABS: 2.0000 | ORAL_TABLET | ORAL | Status: DC | PRN

## 2019-08-13 MED ORDER — SODIUM CHLORIDE 0.9 % IV SOLN
5.0000 10*6.[IU] | Freq: Once | INTRAVENOUS | Status: AC
  Administered 2019-08-13: 5 10*6.[IU] via INTRAVENOUS
  Filled 2019-08-13: qty 5

## 2019-08-13 MED ORDER — PHENYLEPHRINE 40 MCG/ML (10ML) SYRINGE FOR IV PUSH (FOR BLOOD PRESSURE SUPPORT): 80.0000 ug | PREFILLED_SYRINGE | INTRAVENOUS | Status: DC | PRN

## 2019-08-13 MED ORDER — SODIUM BICARBONATE 8.4 % IV SOLN
INTRAVENOUS | Status: DC | PRN
  Administered 2019-08-13: 12 mL via EPIDURAL

## 2019-08-13 MED ORDER — PRENATAL MULTIVITAMIN CH
1.0000 | ORAL_TABLET | Freq: Every day | ORAL | Status: DC
  Administered 2019-08-14: 1 via ORAL
  Filled 2019-08-13: qty 1

## 2019-08-13 MED ORDER — BENZOCAINE-MENTHOL 20-0.5 % EX AERO
1.0000 "application " | INHALATION_SPRAY | CUTANEOUS | Status: DC | PRN
  Administered 2019-08-13: 1 via TOPICAL
  Filled 2019-08-13: qty 56

## 2019-08-13 MED ORDER — LACTATED RINGERS IV SOLN: 500.0000 mL | INTRAVENOUS | Status: DC | PRN

## 2019-08-13 MED ORDER — SIMETHICONE 80 MG PO CHEW: 80.0000 mg | CHEWABLE_TABLET | ORAL | Status: DC | PRN

## 2019-08-13 MED ORDER — OXYTOCIN 40 UNITS IN NORMAL SALINE INFUSION - SIMPLE MED
2.5000 [IU]/h | INTRAVENOUS | Status: DC
  Filled 2019-08-13: qty 1000

## 2019-08-13 MED ORDER — ACETAMINOPHEN 325 MG PO TABS
650.0000 mg | ORAL_TABLET | ORAL | Status: DC | PRN
  Administered 2019-08-14: 650 mg via ORAL
  Filled 2019-08-13: qty 2

## 2019-08-13 MED ORDER — OXYCODONE-ACETAMINOPHEN 5-325 MG PO TABS: 1.0000 | ORAL_TABLET | ORAL | Status: DC | PRN

## 2019-08-13 MED ORDER — TETANUS-DIPHTH-ACELL PERTUSSIS 5-2.5-18.5 LF-MCG/0.5 IM SUSP: 0.5000 mL | Freq: Once | INTRAMUSCULAR | Status: DC

## 2019-08-13 MED ORDER — DIBUCAINE (PERIANAL) 1 % EX OINT: 1.0000 "application " | TOPICAL_OINTMENT | CUTANEOUS | Status: DC | PRN

## 2019-08-13 MED ORDER — ACETAMINOPHEN 325 MG PO TABS: 650.0000 mg | ORAL_TABLET | ORAL | Status: DC | PRN

## 2019-08-13 MED ORDER — ONDANSETRON HCL 4 MG PO TABS: 4.0000 mg | ORAL_TABLET | ORAL | Status: DC | PRN

## 2019-08-13 MED ORDER — ONDANSETRON HCL 4 MG/2ML IJ SOLN
4.0000 mg | Freq: Four times a day (QID) | INTRAMUSCULAR | Status: DC | PRN
  Administered 2019-08-13: 4 mg via INTRAVENOUS
  Filled 2019-08-13: qty 2

## 2019-08-13 MED ORDER — ZOLPIDEM TARTRATE 5 MG PO TABS: 5.0000 mg | ORAL_TABLET | Freq: Every evening | ORAL | Status: DC | PRN

## 2019-08-13 MED ORDER — TERBUTALINE SULFATE 1 MG/ML IJ SOLN: 0.2500 mg | Freq: Once | INTRAMUSCULAR | Status: DC | PRN

## 2019-08-13 MED ORDER — EPHEDRINE 5 MG/ML INJ: 10.0000 mg | INTRAVENOUS | Status: DC | PRN

## 2019-08-13 MED ORDER — ONDANSETRON HCL 4 MG/2ML IJ SOLN: 4.0000 mg | INTRAMUSCULAR | Status: DC | PRN

## 2019-08-13 MED ORDER — WITCH HAZEL-GLYCERIN EX PADS: 1.0000 "application " | MEDICATED_PAD | CUTANEOUS | Status: DC | PRN

## 2019-08-13 MED ORDER — FENTANYL CITRATE (PF) 100 MCG/2ML IJ SOLN
100.0000 ug | INTRAMUSCULAR | Status: DC | PRN
  Administered 2019-08-13 (×3): 100 ug via INTRAVENOUS
  Filled 2019-08-13 (×3): qty 2

## 2019-08-13 MED ORDER — LACTATED RINGERS IV SOLN: 500.0000 mL | Freq: Once | INTRAVENOUS | Status: DC

## 2019-08-13 MED ORDER — LIDOCAINE HCL (PF) 1 % IJ SOLN
INTRAMUSCULAR | Status: DC | PRN
  Administered 2019-08-13 (×2): 5 mL via EPIDURAL

## 2019-08-13 MED ORDER — PENICILLIN G POTASSIUM 5000000 UNITS IJ SOLR
3.0000 10*6.[IU] | INTRAMUSCULAR | Status: DC
  Filled 2019-08-13: qty 5

## 2019-08-13 MED ORDER — FENTANYL-BUPIVACAINE-NACL 0.5-0.125-0.9 MG/250ML-% EP SOLN
12.0000 mL/h | EPIDURAL | Status: DC | PRN
  Filled 2019-08-13: qty 250

## 2019-08-13 MED ORDER — COCONUT OIL OIL: 1.0000 "application " | TOPICAL_OIL | Status: DC | PRN

## 2019-08-13 MED ORDER — DIPHENHYDRAMINE HCL 25 MG PO CAPS: 25.0000 mg | ORAL_CAPSULE | Freq: Four times a day (QID) | ORAL | Status: DC | PRN

## 2019-08-13 MED ORDER — LIDOCAINE HCL (PF) 1 % IJ SOLN
30.0000 mL | INTRAMUSCULAR | Status: DC | PRN
  Filled 2019-08-13: qty 30

## 2019-08-13 MED ORDER — LACTATED RINGERS IV SOLN
INTRAVENOUS | Status: DC
  Administered 2019-08-13 (×2): via INTRAVENOUS

## 2019-08-13 MED ORDER — IBUPROFEN 600 MG PO TABS
600.0000 mg | ORAL_TABLET | Freq: Four times a day (QID) | ORAL | Status: DC
  Administered 2019-08-13 – 2019-08-14 (×4): 600 mg via ORAL
  Filled 2019-08-13 (×4): qty 1

## 2019-08-13 MED ORDER — PENICILLIN G POTASSIUM 5000000 UNITS IJ SOLR: 5.0000 10*6.[IU] | Freq: Once | INTRAMUSCULAR | Status: DC

## 2019-08-13 MED ORDER — PENICILLIN G POT IN DEXTROSE 60000 UNIT/ML IV SOLN
3.0000 10*6.[IU] | INTRAVENOUS | Status: DC
  Administered 2019-08-13 (×2): 3 10*6.[IU] via INTRAVENOUS
  Filled 2019-08-13 (×6): qty 50

## 2019-08-13 MED ORDER — OXYTOCIN 40 UNITS IN NORMAL SALINE INFUSION - SIMPLE MED
1.0000 m[IU]/min | INTRAVENOUS | Status: DC
  Administered 2019-08-13: 2 m[IU]/min via INTRAVENOUS

## 2019-08-13 NOTE — Anesthesia Preprocedure Evaluation (Addendum)
Anesthesia Evaluation  Patient identified by MRN, date of birth, ID band Patient awake    Reviewed: Allergy & Precautions, Patient's Chart, lab work & pertinent test results  Airway Mallampati: II  TM Distance: >3 FB Neck ROM: Full    Dental no notable dental hx. (+) Teeth Intact   Pulmonary former smoker,    Pulmonary exam normal breath sounds clear to auscultation       Cardiovascular negative cardio ROS Normal cardiovascular exam Rhythm:Regular Rate:Normal     Neuro/Psych PSYCHIATRIC DISORDERS Anxiety Depression Hx/o PTSDnegative neurological ROS     GI/Hepatic Neg liver ROS, PUD, GERD  Medicated,  Endo/Other  negative endocrine ROS  Renal/GU negative Renal ROS  negative genitourinary   Musculoskeletal negative musculoskeletal ROS (+)   Abdominal   Peds  Hematology  (+) anemia ,   Anesthesia Other Findings   Reproductive/Obstetrics (+) Pregnancy                            Anesthesia Physical Anesthesia Plan  ASA: II  Anesthesia Plan: Epidural   Post-op Pain Management:    Induction:   PONV Risk Score and Plan:   Airway Management Planned: Natural Airway  Additional Equipment:   Intra-op Plan:   Post-operative Plan:   Informed Consent: I have reviewed the patients History and Physical, chart, labs and discussed the procedure including the risks, benefits and alternatives for the proposed anesthesia with the patient or authorized representative who has indicated his/her understanding and acceptance.       Plan Discussed with: Anesthesiologist  Anesthesia Plan Comments:         Anesthesia Quick Evaluation

## 2019-08-13 NOTE — Progress Notes (Signed)
LABOR PROGRESS NOTE Danielle Carter is a 23 y.o. female G79P0101 with IUP at [redacted]w[redacted]d by  LMP presenting for SROM. Pregnancy complicated by hx of GBS +, PTL, LGSIL, and cystic fibrosis carrier  Subjective:  Patient feeling contractions. No complaints at this time.   Objective: BP 115/66   Pulse 63   Temp 98.3 F (36.8 C) (Oral)   Resp 20   Ht 5\' 4"  (1.626 m)   Wt 67.6 kg   LMP 11/16/2018 (Approximate)   SpO2 97%   BMI 25.58 kg/m  or  Vitals:   08/13/19 0651 08/13/19 0656 08/13/19 0701 08/13/19 0706  BP:      Pulse:      Resp:      Temp:      TempSrc:      SpO2: 98% 97% 98% 97%  Weight:      Height:       Dilation: 1.5 Effacement (%): 50 Cervical Position: Posterior Station: -3 Presentation: Vertex Exam by:: Dr. Jones Bales FHT: baseline rate 120, moderate varibility, -acel, -decel Toco: q 2-3 min  Labs: Lab Results  Component Value Date   WBC 10.4 08/13/2019   HGB 11.6 (L) 08/13/2019   HCT 35.1 (L) 08/13/2019   MCV 85.0 08/13/2019   PLT 218 08/13/2019    Patient Active Problem List   Diagnosis Date Noted  . Supervision of high-risk pregnancy 08/13/2019  . GBS bacteriuria 07/25/2019  . Choroid plexus cyst of fetus on prenatal ultrasound 03/27/2019  . Asymptomatic bacteriuria during pregnancy 02/26/2019  . Low grade squamous intraepith lesion on cytologic smear cervix (lgsil) 02/26/2019  . Supervision of high risk pregnancy, antepartum 02/12/2019  . History of preterm labor, current pregnancy 02/12/2019  . Gastric ulcer 10/15/2013  . Major depression 10/15/2013  . 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 PTSD (post-traumatic stress disorder) 10/15/2013  . 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 female G10P0101 with IUP at [redacted]w[redacted]d by  LMP presenting for SROM. Pregnancy complicated by hx of GBS +, PTL, LGSIL, and cystic fibrosis carrier  #Labor: Patient is in latent labor. SROM was @ 2300.  Augmenting with FB on this exam. Will likely need no further augmentation.  #Fetal Wellbeing:  Cat I #Pain Control:  IV pain meds  #ID: GBS + receiving PNC #Anticipated MOD:  VD  Maxie Better, PGY-1, MD OBGYN Faculty Teaching Service  08/13/2019, 7:17 AM

## 2019-08-13 NOTE — H&P (Addendum)
LABOR AND DELIVERY ADMISSION HISTORY AND PHYSICAL NOTE  Danielle Carter is a 23 y.o. female G38P0101 with IUP at 22w6dby  LMP presenting for PROM. Pregnancy complicated by hx of GBS +, PTL, LGSIL, and cystic fibrosis carrier.   Endorses loss of clear fluid 2300 on 11/10. She reports positive fetal movement. She denies leakage of fluid or vaginal bleeding. Denies headaches, vision changes, dyspnea, or lower extremity edema.    Prenatal History/Complications: -Preterm labor at 37 weeks. Received BMZ at 10/21 and 10/22. -GBS + cultures  -LGSIL-Pap smear in 1 year -Choroid Plexus Cyst resolved on ultrasound 7/22.  -Cystic Fibrosis Carrier   Past Medical History: Past Medical History:  Diagnosis Date  . Anemia   . Anxiety   . Depression   . PTSD (post-traumatic stress disorder)     Past Surgical History: Past Surgical History:  Procedure Laterality Date  . DRUG INDUCED ENDOSCOPY      Obstetrical History: OB History    Gravida  2   Para  1   Term  0   Preterm  1   AB  0   Living  1     SAB      TAB      Ectopic      Multiple      Live Births  1           Social History: Social History   Socioeconomic History  . Marital status: Single    Spouse name: Not on file  . Number of children: Not on file  . Years of education: Not on file  . Highest education level: Not on file  Occupational History  . Not on file  Social Needs  . Financial resource strain: Not on file  . Food insecurity    Worry: Not on file    Inability: Not on file  . Transportation needs    Medical: Not on file    Non-medical: Not on file  Tobacco Use  . Smoking status: Former Smoker    Types: Cigarettes    Quit date: 12/10/2018    Years since quitting: 0.6  . Smokeless tobacco: Never Used  Substance and Sexual Activity  . Alcohol use: Never    Frequency: Never  . Drug use: Never  . Sexual activity: Yes    Birth control/protection: None  Lifestyle  . Physical activity   Days per week: Not on file    Minutes per session: Not on file  . Stress: Not on file  Relationships  . Social [place] phone: Not on file    Gets together: Not on file    Attends religious service: Not on file    Active member of club or organization: Not on file    Attends meetings of clubs or organizations: Not on file    Relationship status: Not on file  Other Topics Concern  . Not on file  Social History Narrative  . Not on file    Family History: Family History  Problem Relation Age of Onset  . Fibromyalgia Mother   . Pancreatitis Father   . Graves' disease Father   . Depression Father     Allergies: Allergies  Allergen Reactions  . Abilify [Aripiprazole]   . Latex   . Pristiq [Desvenlafaxine]   . Rocephin [Ceftriaxone Sodium In Dextrose] Hives    Medications Prior to Admission  Medication Sig Dispense Refill Last Dose  . Prenat-FeAsp-Meth-FA-DHA w/o A (PRENATE PIXIE) 10-0.6-0.4-200  MG CAPS Take 1 tablet by mouth daily. 90 capsule 6 08/12/2019 at Unknown time  . Blood Pressure Monitor KIT 1 each by Does not apply route daily. (Patient not taking: Reported on 08/11/2019) 1 each 0   . butalbital-acetaminophen-caffeine (FIORICET) 50-325-40 MG tablet Take 1 tablet by mouth every 6 (six) hours as needed for headache. (Patient not taking: Reported on 08/11/2019) 20 tablet 0   . Elastic Bandages & Supports (COMFORT FIT MATERNITY SUPP SM) MISC Wear as directed. (Patient not taking: Reported on 08/11/2019) 1 each 0   . fluconazole (DIFLUCAN) 150 MG tablet Take one tablet now and one in 3 days (Patient not taking: Reported on 08/11/2019) 2 tablet 0   . NIFEdipine (PROCARDIA XL) 30 MG 24 hr tablet Take 1 tablet (30 mg total) by mouth daily. (Patient not taking: Reported on 08/11/2019) 30 tablet 1   . nitrofurantoin, macrocrystal-monohydrate, (MACROBID) 100 MG capsule Take 1 capsule (100 mg total) by mouth 2 (two) times daily. (Patient not taking: Reported on 08/11/2019)  14 capsule 1   . ondansetron (ZOFRAN) 4 MG tablet Take 1 tablet (4 mg total) by mouth every 6 (six) hours. (Patient not taking: Reported on 02/12/2019) 12 tablet 0   . oxyCODONE-acetaminophen (PERCOCET/ROXICET) 5-325 MG tablet Take 1-2 tablets by mouth every 6 (six) hours as needed. (Patient not taking: Reported on 08/11/2019) 15 tablet 0   . promethazine (PHENERGAN) 25 MG tablet Take 1 tablet (25 mg total) by mouth every 6 (six) hours as needed for nausea or vomiting. (Patient not taking: Reported on 02/12/2019) 30 tablet 2      Review of Systems   All systems reviewed and negative except as stated in HPI  Blood pressure 126/75, pulse 90, last menstrual period 11/16/2018. General appearance: alert Lungs: clear to auscultation bilaterally Heart: regular rate and rhythm Abdomen: soft, non-tender; bowel sounds normal Extremities: No calf swelling or tenderness Presentation: cephalic vertex on SVE Fetal monitoring: baseline 125, moderate variability, positive accelerations, negative decelerations  Uterine activity: q 2-3 min   Dilation: 1.5 Effacement (%): 50 Station: Ballotable Exam by:: [employer]. RN   Prenatal labs: ABO, Rh: --/--/PENDING (11/11 0103) Antibody: PENDING (11/11 0103) Rubella: 2.27 (05/13 1522) RPR: Non Reactive (09/08 0917)  HBsAg: Negative (05/13 1522)  HIV: Non Reactive (09/08 0917)  GBS:    1 hr Glucola: WNL Genetic screening:  WNL Anatomy US: 7/22- EFW 536 and 55%. Choroid plexus cyst resolved  Prenatal Transfer Tool  Maternal Diabetes: No Genetic Screening: Abnormal:  Results: Other: mother cystic fibrosis carrier  Maternal Ultrasounds/Referrals: Normal and Isolated choroid plexus cyst since resolved  Fetal Ultrasounds or other Referrals:  None Maternal Substance Abuse:  No Significant Maternal Medications:  None Significant Maternal Lab Results: Group B Strep positive  Results for orders placed or performed during the hospital encounter of 08/13/19  (from the past 24 hour(s))  Fern Test   Collection Time: 08/13/19 12:35 AM  Result Value Ref Range   POCT Fern Test Positive = ruptured amniotic membanes   Type and screen [place]   Collection Time: 08/13/19  1:03 AM  Result Value Ref Range   ABO/RH(D) PENDING    Antibody Screen PENDING    Sample Expiration      08/16/2019,2359 Performed at [facility], [address] [place], [place] [address]     Patient Active Problem List   Diagnosis Date Noted  . GBS bacteriuria 07/25/2019  . Choroid plexus cyst of fetus on prenatal  ultrasound 03/27/2019  . Asymptomatic bacteriuria during pregnancy 02/26/2019  . Low grade squamous intraepith lesion on cytologic smear cervix (lgsil) 02/26/2019  . Supervision of high risk pregnancy, antepartum 02/12/2019  . History of preterm labor, current pregnancy 02/12/2019  . Gastric ulcer 10/15/2013  . Major depression 10/15/2013  . 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 PTSD (post-traumatic stress disorder) 10/15/2013  . 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 female G58P0101 with IUP at 37w6dby  LMP presenting for PROM. Pregnancy complicated by hx of GBS +, PTL, LGSIL, chorioid plexus cyst now resolved, and cystic fibrosis carrier.   #Labor: Presents in PROM with consistent contractile pattern. Will hold on augmentation at this time.  #Pain: IV medication and epidural upon request #FWB: Cat I  #ID: GBS +, order penicillin  #MOF: breast  #MOC: Nexplanon  #Circ:  Yes (outpatient)   #Hx of PTL- PTL at 37 weeks. She reeceived BMZ at 10/21 and 10/22. #LGSIL-Pap smear in 1 year  JMaxie Better MD, PGY-1  OBGYN Faculty Teaching Service  08/13/2019, 2:09 AM  Attestation:  I confirm that I have verified the information documented in the resident's note   Agree with note NST reactive and reassuring UCs as listed Cervical exams as listed  in note  WSeabron Spates CNM

## 2019-08-13 NOTE — Progress Notes (Signed)
10 instruments 2 injectables 5 9x9 sponges 5 4x18 sponges MO 

## 2019-08-13 NOTE — Anesthesia Procedure Notes (Signed)
Epidural Patient location during procedure: OB Start time: 08/13/2019 11:11 AM End time: 08/13/2019 11:24 AM  Staffing Anesthesiologist: Duane Boston, MD Performed: anesthesiologist   Preanesthetic Checklist Completed: patient identified, site marked, pre-op evaluation, timeout performed, IV checked, risks and benefits discussed and monitors and equipment checked  Epidural Patient position: sitting Prep: DuraPrep Patient monitoring: heart rate, cardiac monitor, continuous pulse ox and blood pressure Approach: midline Location: L2-L3 Injection technique: LOR saline  Needle:  Needle type: Tuohy  Needle gauge: 17 G Needle length: 9 cm Needle insertion depth: 5 cm Catheter size: 20 Guage Catheter at skin depth: 10 cm Test dose: negative and Other  Assessment Events: blood not aspirated, injection not painful, no injection resistance and negative IV test  Additional Notes Informed consent obtained prior to proceeding including risk of failure, 1% risk of PDPH, risk of minor discomfort and bruising.  Discussed rare but serious complications including epidural abscess, permanent nerve injury, epidural hematoma.  Discussed alternatives to epidural analgesia and patient desires to proceed.  Timeout performed pre-procedure verifying patient name, procedure, and platelet count.  Patient tolerated procedure well.

## 2019-08-13 NOTE — Discharge Summary (Signed)
OB Discharge Summary     Patient Name: Danielle Carter DOB: [birth year] MRN: [id number]  Date of admission: 08/13/2019 Delivering MD: Gifford Shave   Date of discharge: 08/14/2019  Admitting diagnosis: 56 WKS, LEAKING FLUIDS Intrauterine pregnancy: [redacted]w[redacted]d    Secondary diagnosis:  Active Problems:   History of preterm labor, current pregnancy   Asymptomatic bacteriuria during pregnancy   Low grade squamous intraepith lesion on cytologic smear cervix (lgsil)   GBS bacteriuria   Supervision of high-risk pregnancy   Nexplanon in place  Additional problems: None     Discharge diagnosis: Term Pregnancy Delivered                                                                                                Post partum procedures: Nexplanon  Augmentation: None   Complications: Shoulder Dystocia x1 min   Hospital course:  Onset of Labor With Vaginal Delivery     23y.o. yo G2P0101 at 360w6das admitted in Latent Labor on 08/13/2019. Patient had an uncomplicated labor course as follows:  Membrane Rupture Time/Date: 11:00 PM ,08/12/2019   Intrapartum Procedures: Episiotomy: None [1]                                         Lacerations:  Labial [10];1st degree [2]  Patient had a delivery of a Viable infant. 08/13/2019  Information for the patient's newborn:  [place], [place] Evi [[id number]]     Pateint had an uncomplicated postpartum course.  She is ambulating, tolerating a regular diet, passing flatus, and urinating well. Patient is discharged home in stable condition on 08/14/19.   Physical exam  Vitals:   08/13/19 1930 08/13/19 2331 08/14/19 0311 08/14/19 0749  BP: (!) 111/57 (!) 97/59 134/72 117/69  Pulse: 77 61 60 (!) 57  Resp: _0 Temp: 98.2 F (36.8 C) 98.4 F (36.9 C) 98.2 F (36.8 C) 98.3 F (36.8 C)  TempSrc: Oral Oral Oral Oral  SpO2: 99% 99%  99%  Weight:      Height:       General: alert, cooperative and no distress Lochia: appropriate Uterine  Fundus: firm Incision: N/A DVT Evaluation: No evidence of DVT seen on physical exam. Labs: Lab Results  Component Value Date   WBC 11.2 (H) 08/14/2019   HGB 10.8 (L) 08/14/2019   HCT 33.7 (L) 08/14/2019   MCV 86.0 08/14/2019   PLT 201 08/14/2019   CMP Latest Ref Rng & Units 08/13/2019  Glucose 70 - 99 mg/dL 85  BUN 6 - 20 mg/dL <5(L)  Creatinine 0.44 - 1.00 mg/dL 0.62  Sodium 135 - 145 mmol/L 136  Potassium 3.5 - 5.1 mmol/L 3.4(L)  Chloride 98 - 111 mmol/L 106  CO2 22 - 32 mmol/L 18(L)  Calcium 8.9 - 10.3 mg/dL 9.1  Total Protein 6.5 - 8.1 g/dL 6.1(L)  Total Bilirubin 0.3 - 1.2 mg/dL 0.6  Alkaline Phos 38 - 126 U/L 184(H)  AST 15 - 41  U/L 20  ALT 0 - 44 U/L 12    Discharge instruction: per After Visit Summary and "Baby and Me Booklet".  After visit meds:  Allergies as of 08/14/2019      Reactions   Abilify [aripiprazole]    Pristiq [desvenlafaxine]    Rocephin [ceftriaxone Sodium In Dextrose] Hives   Latex Rash      Medication List    TAKE these medications   acetaminophen 325 MG tablet Commonly known as: Tylenol Take 2 tablets (650 mg total) by mouth every 4 (four) hours as needed (for pain scale < 4).   Blood Pressure Monitor Kit 1 each by Does not apply route daily.   butalbital-acetaminophen-caffeine 50-325-40 MG tablet Commonly known as: FIORICET Take 1 tablet by mouth every 6 (six) hours as needed for headache.   calcium carbonate 500 MG chewable tablet Commonly known as: TUMS - dosed in mg elemental calcium Chew 1-2 tablets by mouth as needed for indigestion or heartburn.   [place] Supp Sm Misc Wear as directed.   fluconazole 150 MG tablet Commonly known as: DIFLUCAN Take one tablet now and one in 3 days   ibuprofen 600 MG tablet Commonly known as: ADVIL Take 1 tablet (600 mg total) by mouth every 6 (six) hours.   NIFEdipine 30 MG 24 hr tablet Commonly known as: Procardia XL Take 1 tablet (30 mg total) by mouth daily.    nitrofurantoin (macrocrystal-monohydrate) 100 MG capsule Commonly known as: MACROBID Take 1 capsule (100 mg total) by mouth 2 (two) times daily.   ondansetron 4 MG tablet Commonly known as: ZOFRAN Take 1 tablet (4 mg total) by mouth every 6 (six) hours.   oxyCODONE-acetaminophen 5-325 MG tablet Commonly known as: PERCOCET/ROXICET Take 1-2 tablets by mouth every 6 (six) hours as needed.   Prenate Pixie 10-0.6-0.4-200 MG Caps Take 1 tablet by mouth daily.   promethazine 25 MG tablet Commonly known as: PHENERGAN Take 1 tablet (25 mg total) by mouth every 6 (six) hours as needed for nausea or vomiting.       Diet: routine diet  Activity: Advance as tolerated. Pelvic rest for 6 weeks.   Outpatient follow up:4 weeks Follow up Appt: Future Appointments  Date Time Provider [place]  09/15/2019  1:15 PM Leftwich-Kirby, Kathie Dike, CNM [place] None   Follow up Visit:No follow-ups on file.   Please schedule this patient for Postpartum visit in: 4 weeks with the following provider: Any provider For C/S patients schedule nurse incision check in weeks 2 weeks: no Low risk pregnancy complicated by: LGSIL, GBS, h/o pTL  Delivery mode:  SVD Anticipated Birth Control:  Nexplanon PP Procedures needed: None   Schedule Integrated [place] visit: no  Postpartum contraception: Nexplanon   Newborn Data: Live born female  Birth Weight:  3359 g APGAR: 80, 9  Newborn Delivery   Time head delivered: 08/13/2019 15:39:00 Birth date/time: 08/13/2019 15:40:00 Delivery type: Vaginal, Spontaneous      Baby Feeding: Breast Disposition:home with mother   08/14/2019 Merilyn Baba, DO

## 2019-08-13 NOTE — MAU Note (Signed)
Pt reports to MAU c/o possible SROM pt states around 2300 she went to the bathroom and had a constant stream of clear fluid with no smell. Pt states her bottoms are wet. Pt reports 2 FM in the last hour. No bleeding. Irregular ctx.

## 2019-08-14 DIAGNOSIS — Z30017 Encounter for initial prescription of implantable subdermal contraceptive: Secondary | ICD-10-CM

## 2019-08-14 DIAGNOSIS — Z975 Presence of (intrauterine) contraceptive device: Secondary | ICD-10-CM

## 2019-08-14 LAB — CBC
HCT: 33.7 % — ABNORMAL LOW (ref 36.0–46.0)
Hemoglobin: 10.8 g/dL — ABNORMAL LOW (ref 12.0–15.0)
MCH: 27.6 pg (ref 26.0–34.0)
MCHC: 32 g/dL (ref 30.0–36.0)
MCV: 86 fL (ref 80.0–100.0)
Platelets: 201 10*3/uL (ref 150–400)
RBC: 3.92 MIL/uL (ref 3.87–5.11)
RDW: 13.5 % (ref 11.5–15.5)
WBC: 11.2 10*3/uL — ABNORMAL HIGH (ref 4.0–10.5)
nRBC: 0 % (ref 0.0–0.2)

## 2019-08-14 MED ORDER — OXYCODONE HCL 5 MG PO TABS
5.0000 mg | ORAL_TABLET | ORAL | Status: DC | PRN
  Administered 2019-08-14: 5 mg via ORAL
  Filled 2019-08-14: qty 1

## 2019-08-14 MED ORDER — ETONOGESTREL 68 MG ~~LOC~~ IMPL
68.0000 mg | DRUG_IMPLANT | Freq: Once | SUBCUTANEOUS | Status: AC
  Administered 2019-08-14: 68 mg via SUBCUTANEOUS
  Filled 2019-08-14: qty 1

## 2019-08-14 MED ORDER — LIDOCAINE HCL 1 % IJ SOLN
0.0000 mL | Freq: Once | INTRAMUSCULAR | Status: AC | PRN
  Administered 2019-08-14: 20 mL via INTRADERMAL
  Filled 2019-08-14: qty 20

## 2019-08-14 MED ORDER — IBUPROFEN 600 MG PO TABS: 600.0000 mg | ORAL_TABLET | Freq: Four times a day (QID) | ORAL | 0 refills | Status: DC

## 2019-08-14 MED ORDER — ACETAMINOPHEN 325 MG PO TABS: 650.0000 mg | ORAL_TABLET | ORAL | 0 refills | Status: DC | PRN

## 2019-08-14 NOTE — Anesthesia Postprocedure Evaluation (Signed)
Anesthesia Post Note  Patient: Danielle Carter  Procedure(s) Performed: AN AD HOC LABOR EPIDURAL     Patient location during evaluation: Mother Baby Anesthesia Type: Epidural Level of consciousness: awake, awake and alert and oriented Pain management: pain level controlled Vital Signs Assessment: post-procedure vital signs reviewed and stable Respiratory status: spontaneous breathing and respiratory function stable Cardiovascular status: blood pressure returned to baseline Postop Assessment: no headache, epidural receding, patient able to bend at knees, adequate PO intake, no backache, no apparent nausea or vomiting and able to ambulate Anesthetic complications: no    Last Vitals:  Vitals:   08/14/19 0311 08/14/19 0749  BP: 134/72 117/69  Pulse: 60 (!) 57  Resp: 17 20  Temp: 36.8 C 36.8 C  SpO2:  99%    Last Pain:  Vitals:   08/14/19 0749  TempSrc: Oral  PainSc: 0-No pain   Pain Goal:                   Bufford Spikes

## 2019-08-14 NOTE — Procedures (Signed)
  Procedure Note: Nexplanon insertion  Patient is a 23 y.o. [id number] now PPD# 1 from SVD. She desires long-term reversible contraception.  Risks/benefits/side effects of Nexplanon have been discussed with patient and her questions have been answered. Patient is aware of the common side effect of irregular bleeding, which the incidence of decreases over time.  BP 117/69 (BP Location: Right Arm)   Pulse (!) 57   Temp 98.3 F (36.8 C) (Oral)   Resp 20   Ht 5\' 4"  (1.626 m)   Wt 67.6 kg   LMP 11/16/2018 (Approximate)   SpO2 99%   Breastfeeding Unknown   BMI 25.58 kg/m   Results for orders placed or performed during the hospital encounter of 08/13/19 (from the past 24 hour(s))  CBC   Collection Time: 08/14/19  5:36 AM  Result Value Ref Range   WBC 11.2 (H) 4.0 - 10.5 K/uL   RBC 3.92 3.87 - 5.11 MIL/uL   Hemoglobin 10.8 (L) 12.0 - 15.0 g/dL   HCT 33.7 (L) 36.0 - 46.0 %   MCV 86.0 80.0 - 100.0 fL   MCH 27.6 26.0 - 34.0 pg   MCHC 32.0 30.0 - 36.0 g/dL   RDW 13.5 11.5 - 15.5 %   Platelets 201 150 - 400 K/uL   nRBC 0.0 0.0 - 0.2 %     She is right-handed, so her left arm, approximately 10 cm proximal from the elbow, was cleansed with alcohol and anesthetized with 2 mL of 1% Lidocaine with Epi.  The area was cleansed again with betadine and the Nexplanon was inserted per manufacturer's recommendations without difficulty.  A pressure bandage were applied.  Pt was instructed to keep the area clean and dry, remove pressure bandage in 24 hours.  She was given a card indicating date Nexplanon was inserted and date it needs to be removed. Follow-up PRN problems.  Phill Myron, D.O. OB Fellow  08/14/2019, 10:13 AM

## 2019-08-14 NOTE — Lactation Note (Signed)
This note was copied from a baby's chart. Lactation Consultation Note  Patient Name: [name] Date: 08/14/2019 Reason for consult: Initial assessment;Early term 37-38.6wks;Infant weight loss  20 hours old ETI female who is being exclusively BF by his mother, she's a P2. Baby is at 3% weigh loss. Mom experienced some BF difficulties with her first baby, she wouldn't latch and by the time baby was 2 months old her milk completely dried out. She participated in the [facility] program at the [facility] but she doesn't have a pump at home, [facility] offered a hand pump from the hospital, mom already familiar with [employer] products and told [place] she didn't need help with instructions. Reviewed breastmilk storage guidelines.  Offered assistance with latch but mom politely declined stating that baby already fed; he was asleep. Asked mom to call for assistance when needed. She wasn't familiar with hand expression; LC offered assistance with hand expression and she was able to get lots of colostrum drops, LC collected about 2 ml of colostrum in a medicine cup. Reviewed normal newborn behavior, feeding cues, cluster feeding and lactogenesis II.  Feeding plan:  1. Encouraged mom to feed baby STS 8-12 times/24 hours or sooner if feeding cues are present  2. Hand expression and spoon feeding were also encouraged  BF brochure, BF resources and feeding diary were reviewed. Parents reported all questions and concerns were answered, they're both aware of [facility] services and will call PRN.   Maternal Data Formula Feeding for Exclusion: No Has patient been taught Hand Expression?: Yes Does the patient have breastfeeding experience prior to this delivery?: Yes  Feeding    LATCH Score                   Interventions Interventions: Breast feeding basics reviewed;Breast massage;Hand express;Breast compression;Hand pump  Lactation Tools Discussed/Used Tools: Pump Breast pump type: Manual WIC Program:  Yes Pump Review: Setup, frequency, and cleaning;Milk Storage Initiated by:: MPeck Date initiated:: 08/14/19   Consult Status Consult Status: Follow-up Date: 08/15/19 Follow-up type: In-patient    Danielle Carter 08/14/2019, 11:50 AM

## 2019-08-14 NOTE — Discharge Instructions (Signed)

## 2019-08-14 NOTE — Progress Notes (Signed)
POSTPARTUM PROGRESS NOTE  Subjective:  Danielle Carter is a 23 y.o. [id number] s/p SVD at [redacted]w[redacted]d.  She reports she is doing well. No acute events overnight. She denies any problems with ambulating, voiding or po intake. Denies nausea or vomiting.  Pain is moderately controlled but would like something additional for pain.  Lochia is  Minimal   Objective: Blood pressure 134/72, pulse 60, temperature 98.2 F (36.8 C), temperature source Oral, resp. rate 17, height 5\' 4"  (1.626 m), weight 67.6 kg, last menstrual period 11/16/2018, SpO2 99 %, unknown if currently breastfeeding.  Physical Exam:  General: alert, cooperative and no distress Chest: no respiratory distress Heart:regular rate, distal pulses intact Abdomen: soft, nontender,  Uterine Fundus: firm, appropriately tender DVT Evaluation: No calf swelling or tenderness Extremities: no edema Skin: warm, dry  Recent Labs    08/13/19 0103 08/14/19 0536  HGB 11.6* 10.8*  HCT 35.1* 33.7*    Assessment/Plan: Danielle Carter is a 23 y.o. [id number] s/p SVD at [redacted]w[redacted]d   PPD#1 - Doing well. Continue routine postpartum care. Ordering Oxycodone 5 mg q4.  Contraception: Nexplanon  Feeding: Breast Dispo: Plan for discharge PPD 1 or 2 .   LOS: 1 day   Maxie Better, MD, PGY-1 OBGYN Faculty Teaching Service  08/14/2019, 7:23 AM

## 2019-08-14 NOTE — Progress Notes (Signed)
MOB was referred for history of depression/anxiety.  * Referral screened out by Clinical Social Worker because none of the following criteria appear to apply:  ~ History of anxiety/depression during this pregnancy, or of post-partum depression following prior delivery. ~ Diagnosis of anxiety and/or depression within last 3 years OR * MOB's symptoms currently being treated with medication and/or therapy.  Please contact the Clinical Social Worker if needs arise or by MOB request.  Kiril Hippe, LCSWA  [facility] [phone]  

## 2019-08-18 ENCOUNTER — Telehealth: Payer: Medicaid Other | Admitting: Advanced Practice Midwife

## 2019-09-15 ENCOUNTER — Encounter: Payer: Self-pay | Admitting: Advanced Practice Midwife

## 2019-09-15 ENCOUNTER — Telehealth (INDEPENDENT_AMBULATORY_CARE_PROVIDER_SITE_OTHER): Payer: Medicaid Other | Admitting: Advanced Practice Midwife

## 2019-09-15 DIAGNOSIS — Z8751 Personal history of pre-term labor: Secondary | ICD-10-CM | POA: Insufficient documentation

## 2019-09-15 DIAGNOSIS — Z1389 Encounter for screening for other disorder: Secondary | ICD-10-CM | POA: Diagnosis not present

## 2019-09-15 NOTE — Progress Notes (Signed)
TELEHEALTH POSTPARTUM VIRTUAL VIDEO VISIT ENCOUNTER NOTE   Provider location: Center for [facility] at [place]   I connected with [place] on 09/15/19 at  1:15 PM EST by MyChart Video Encounter at home and verified that I am speaking with the correct person using two identifiers.    I discussed the limitations, risks, security and privacy concerns of performing an evaluation and management service virtually and the availability of in person appointments. I also discussed with the patient that there may be a patient responsible charge related to this service. The patient expressed understanding and agreed to proceed.  Chief Complaint: Postpartum Visit  History of Present Illness: Danielle Carter is a 23 y.o. Caucasian [id number] being evaluated for postpartum followup.   She is 4 weeks postpartum following a spontaneous vaginal delivery. I have fully reviewed the prenatal and intrapartum course. The delivery was at 12w6dgestational weeks. Outcome: spontaneous vaginal delivery. Anesthesia: epidural. Postpartum course has been unremarkable. Baby's course has been unremarkable. Baby is feeding by both breast and formula. Bleeding no bleeding. Bowel function is normal. Bladder function is normal. Patient is not sexually active. Contraception method is Nexplanon. Postpartum depression screening: negative. EPDS = 1  Review of Systems:  Her 12 point review of systems is negative or as noted in the History of Present Illness.  Patient Active Problem List   Diagnosis Date Noted  . Nexplanon in place 08/14/2019  . Supervision of high-risk pregnancy 08/13/2019  . GBS bacteriuria 07/25/2019  . Choroid plexus cyst of fetus on prenatal ultrasound 03/27/2019  . Asymptomatic bacteriuria during pregnancy 02/26/2019  . Low grade squamous intraepith lesion on cytologic smear cervix (lgsil) 02/26/2019  . Supervision of high risk pregnancy, antepartum 02/12/2019  . History of preterm labor,  current pregnancy 02/12/2019  . Gastric ulcer 10/15/2013  . Major depression 10/15/2013  . 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 PTSD (post-traumatic stress disorder) 10/15/2013  . 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 Current Outpatient Medications  Medication Sig Dispense Refill  . Blood Pressure Monitor KIT 1 each by Does not apply route daily. 1 each 0  . acetaminophen (TYLENOL) 325 MG tablet Take 2 tablets (650 mg total) by mouth every 4 (four) hours as needed (for pain scale < 4). (Patient not taking: Reported on 09/15/2019) 30 tablet 0  . butalbital-acetaminophen-caffeine (FIORICET) 50-325-40 MG tablet Take 1 tablet by mouth every 6 (six) hours as needed for headache. (Patient not taking: Reported on 08/11/2019) 20 tablet 0  . calcium carbonate (TUMS - DOSED IN MG ELEMENTAL CALCIUM) 500 MG chewable tablet Chew 1-2 tablets by mouth as needed for indigestion or heartburn.    .Danielle Carter & Supports (COMFORT FIT MATERNITY SUPP SM) MISC Wear as directed. (Patient not taking: Reported on 08/11/2019) 1 each 0  . fluconazole (DIFLUCAN) 150 MG tablet Take one tablet now and one in 3 days (Patient not taking: Reported on 08/11/2019) 2 tablet 0  . ibuprofen (ADVIL) 600 MG tablet Take 1 tablet (600 mg total) by mouth every 6 (six) hours. (Patient not taking: Reported on 09/15/2019) 30 tablet 0  . NIFEdipine (PROCARDIA XL) 30 MG 24 hr tablet Take 1 tablet (30 mg total) by mouth daily. (Patient not taking: Reported on 09/15/2019) 30 tablet 1  . nitrofurantoin, macrocrystal-monohydrate, (MACROBID) 100 MG capsule Take 1 capsule (100 mg total) by mouth 2 (two) times daily. (Patient  not taking: Reported on 08/11/2019) 14 capsule 1  . ondansetron (ZOFRAN) 4 MG tablet Take 1 tablet (4 mg total) by mouth every 6 (six) hours. (Patient not taking: Reported on 02/12/2019) 12 tablet 0  .  oxyCODONE-acetaminophen (PERCOCET/ROXICET) 5-325 MG tablet Take 1-2 tablets by mouth every 6 (six) hours as needed. (Patient not taking: Reported on 08/11/2019) 15 tablet 0  . Prenat-FeAsp-Meth-FA-DHA w/o A (PRENATE PIXIE) 10-0.6-0.4-200 MG CAPS Take 1 tablet by mouth daily. (Patient not taking: Reported on 09/15/2019) 90 capsule 6  . promethazine (PHENERGAN) 25 MG tablet Take 1 tablet (25 mg total) by mouth every 6 (six) hours as needed for nausea or vomiting. (Patient not taking: Reported on 02/12/2019) 30 tablet 2   No current facility-administered medications for this visit.    Allergies Danielle Carter [aripiprazole], Danielle Carter [desvenlafaxine], Danielle Carter [ceftriaxone sodium in dextrose], and Danielle Carter  Physical Exam:  BP 101/62   Pulse 64   LMP 11/16/2018 (Approximate)    General:  Alert, oriented and cooperative. Patient is in no acute distress.  Mental Status: Normal mood and affect. Normal behavior. Normal judgment and thought content.   Respiratory: Normal respiratory effort noted, no problems with respiration noted  Rest of physical exam deferred due to type of encounter  PP Depression Screening:   Edinburgh Postnatal Depression Scale Screening Tool 09/15/2019 08/13/2019  I have been able to laugh and see the funny side of things. 0 0  I have looked forward with enjoyment to things. 0 0  I have blamed myself unnecessarily when things went wrong. 0 2  I have been anxious or worried for no good reason. 1 1  I have felt scared or panicky for no good reason. 0 0  Things have been getting on top of me. 0 0  I have been so unhappy that I have had difficulty sleeping. 0 0  I have felt sad or miserable. 0 1  I have been so unhappy that I have been crying. 0 0  The thought of harming myself has occurred to me. 0 0  Edinburgh Postnatal Depression Scale Total 1 4     Assessment:Patient is a 23 y.o. [id number] who is 4 weeks postpartum from a normal spontaneous vaginal delivery.  She is doing well.    Plan:  1. Encounter for postpartum care and examination after delivery --Normal postpartum evaluation, pt adjusting well to life with new baby.  Nexplanon was placed in hospital, pt happy with method. --F/U in 1 year or as needed  I discussed the assessment and treatment plan with the patient. The patient was provided an opportunity to ask questions and all were answered. The patient agreed with the plan and demonstrated an understanding of the instructions.   The patient was advised to call back or seek an in-person evaluation/go to the ED for any concerning postpartum symptoms.  I provided 10 minutes of face-to-face time during this encounter.   Fatima Blank, [place] for [employer], [place]

## 2020-02-12 ENCOUNTER — Ambulatory Visit: Payer: Medicaid Other | Admitting: Obstetrics

## 2020-02-12 ENCOUNTER — Encounter: Payer: Self-pay | Admitting: Obstetrics

## 2020-02-12 ENCOUNTER — Other Ambulatory Visit: Payer: Self-pay

## 2020-02-12 VITALS — BP 124/75 | HR 56 | Wt 130.0 lb

## 2020-02-12 DIAGNOSIS — Z3046 Encounter for surveillance of implantable subdermal contraceptive: Secondary | ICD-10-CM

## 2020-02-12 DIAGNOSIS — Z3009 Encounter for other general counseling and advice on contraception: Secondary | ICD-10-CM

## 2020-02-12 DIAGNOSIS — Z30011 Encounter for initial prescription of contraceptive pills: Secondary | ICD-10-CM

## 2020-02-12 MED ORDER — NORETHIN ACE-ETH ESTRAD-FE 1-20 MG-MCG(24) PO TABS: 1.0000 | ORAL_TABLET | Freq: Every day | ORAL | 11 refills | Status: DC

## 2020-02-12 NOTE — Progress Notes (Signed)
NEXPLANON REMOVAL NOTE  Date of LMP:   unknown  Contraception used: *Nexplanon   Indications:  The patient desires removal of Nexplanon.  She understands risks, benefits, and alternatives to Implanon and would like to proceed.  Anesthesia:   Lidocaine 1% plain.  Procedure:  A time-out was performed confirming the procedure and the patient's allergy status.  Complications: None                      The rod was palpated and the area was sterilely prepped.  The area beneath the distal tip was anesthetized with 1% xylocaine and the skin incised                       Over the tip and the tip was exposed, grasped with forcep and removed intact.  A single suture of 4-0 Vicryl was used to close incision.  Steri strip                       And a bandage applied and the arm was wrapped with gauze bandage.  The patient tolerated well.  Instructions:  The patient was instructed to remove the dressing in 24 hours and that some bruising is to be expected.  She was advised to use over the counter analgesics as needed for any pain at the site.  She is to keep the area dry for 24 hours and to call if her hand or arm becomes cold, numb, or blue.  Return visit:  Return in 2 weeks   Brock Bad, MD 02/12/2020 2:37 PM

## 2020-02-12 NOTE — Progress Notes (Signed)
Pt presents for Nexplanon Removal  Pt would like BCP's.  CC: Vaginal Bleeding since insertion for 6 months.

## 2020-02-26 ENCOUNTER — Ambulatory Visit: Payer: Medicaid Other | Admitting: Obstetrics

## 2020-05-07 ENCOUNTER — Other Ambulatory Visit: Payer: Self-pay

## 2020-05-07 ENCOUNTER — Encounter (HOSPITAL_COMMUNITY): Payer: Self-pay | Admitting: Emergency Medicine

## 2020-05-07 ENCOUNTER — Emergency Department (HOSPITAL_COMMUNITY)
Admission: EM | Admit: 2020-05-07 | Discharge: 2020-05-07 | Disposition: A | Discharge status: Home / Self Care | Payer: Medicaid Other | Attending: Emergency Medicine | Admitting: Emergency Medicine

## 2020-05-07 DIAGNOSIS — M791 Myalgia, unspecified site: Secondary | ICD-10-CM | POA: Diagnosis not present

## 2020-05-07 DIAGNOSIS — Z5321 Procedure and treatment not carried out due to patient leaving prior to being seen by health care provider: Secondary | ICD-10-CM | POA: Diagnosis not present

## 2020-05-07 DIAGNOSIS — R112 Nausea with vomiting, unspecified: Secondary | ICD-10-CM | POA: Insufficient documentation

## 2020-05-07 DIAGNOSIS — R05 Cough: Secondary | ICD-10-CM | POA: Diagnosis not present

## 2020-05-07 LAB — CBC
HCT: 45.6 % (ref 36.0–46.0)
Hemoglobin: 14.8 g/dL (ref 12.0–15.0)
MCH: 27.2 pg (ref 26.0–34.0)
MCHC: 32.5 g/dL (ref 30.0–36.0)
MCV: 83.7 fL (ref 80.0–100.0)
Platelets: 233 10*3/uL (ref 150–400)
RBC: 5.45 MIL/uL — ABNORMAL HIGH (ref 3.87–5.11)
RDW: 14.4 % (ref 11.5–15.5)
WBC: 13.5 10*3/uL — ABNORMAL HIGH (ref 4.0–10.5)
nRBC: 0 % (ref 0.0–0.2)

## 2020-05-07 LAB — COMPREHENSIVE METABOLIC PANEL
ALT: 11 U/L (ref 0–44)
AST: 14 U/L — ABNORMAL LOW (ref 15–41)
Albumin: 4.3 g/dL (ref 3.5–5.0)
Alkaline Phosphatase: 60 U/L (ref 38–126)
Anion gap: 11 (ref 5–15)
BUN: 10 mg/dL (ref 6–20)
CO2: 24 mmol/L (ref 22–32)
Calcium: 9.3 mg/dL (ref 8.9–10.3)
Chloride: 106 mmol/L (ref 98–111)
Creatinine, Ser: 0.98 mg/dL (ref 0.44–1.00)
GFR calc Af Amer: >60 mL/min (ref >60)
GFR calc non Af Amer: >60 mL/min (ref >60)
Glucose, Bld: 102 mg/dL — ABNORMAL HIGH (ref 70–99)
Potassium: 3.7 mmol/L (ref 3.5–5.1)
Sodium: 141 mmol/L (ref 135–145)
Total Bilirubin: 0.7 mg/dL (ref 0.3–1.2)
Total Protein: 7.7 g/dL (ref 6.5–8.1)

## 2020-05-07 LAB — I-STAT BETA HCG BLOOD, ED (MC, WL, AP ONLY): I-stat hCG, quantitative: <5 m[IU]/mL (ref <5)

## 2020-05-07 LAB — LIPASE, BLOOD: Lipase: 24 U/L (ref 11–51)

## 2020-05-07 MED ORDER — SODIUM CHLORIDE 0.9% FLUSH: 3.0000 mL | Freq: Once | INTRAVENOUS | Status: DC

## 2020-05-07 NOTE — ED Notes (Signed)
Called pt x2 for vitals, no response. °

## 2020-05-07 NOTE — ED Triage Notes (Signed)
Pt reports body aches, n/v/ and cough at home. States husband has same symptoms.

## 2020-05-07 NOTE — ED Notes (Signed)
Leaving AMA.

## 2020-05-07 NOTE — ED Notes (Signed)
Called for pt x3 for vitals, no response. 

## 2020-05-09 DIAGNOSIS — Z20822 Contact with and (suspected) exposure to covid-19: Secondary | ICD-10-CM | POA: Diagnosis not present

## 2020-05-09 DIAGNOSIS — J0140 Acute pansinusitis, unspecified: Secondary | ICD-10-CM | POA: Diagnosis not present

## 2020-05-09 DIAGNOSIS — R112 Nausea with vomiting, unspecified: Secondary | ICD-10-CM | POA: Diagnosis not present

## 2020-05-10 ENCOUNTER — Telehealth: Payer: Self-pay | Admitting: *Deleted

## 2020-05-10 NOTE — Telephone Encounter (Signed)
Danielle Carter presented to the ED and left before being seen by the provider on 05/07/20. The patient has been enrolled in an automated general discharge outreach program and 2 attempts to contact the patient will be made to follow up on their ED visit and subsequent needs. The care management team is available to provide assistance to this patient at any time.   Burnard Bunting, RN, BSN, CCRN [facility] Engagement [facility] [phone]

## 2020-05-21 ENCOUNTER — Encounter (HOSPITAL_COMMUNITY): Payer: Self-pay | Admitting: Pediatrics

## 2020-05-21 ENCOUNTER — Emergency Department (HOSPITAL_COMMUNITY)
Admission: EM | Admit: 2020-05-21 | Discharge: 2020-05-21 | Disposition: A | Discharge status: Home / Self Care | Payer: Medicaid Other | Attending: Emergency Medicine | Admitting: Emergency Medicine

## 2020-05-21 ENCOUNTER — Other Ambulatory Visit: Payer: Self-pay

## 2020-05-21 DIAGNOSIS — Z79899 Other long term (current) drug therapy: Secondary | ICD-10-CM | POA: Insufficient documentation

## 2020-05-21 DIAGNOSIS — Z87891 Personal history of nicotine dependence: Secondary | ICD-10-CM | POA: Insufficient documentation

## 2020-05-21 DIAGNOSIS — N3000 Acute cystitis without hematuria: Secondary | ICD-10-CM | POA: Diagnosis not present

## 2020-05-21 DIAGNOSIS — R109 Unspecified abdominal pain: Secondary | ICD-10-CM | POA: Insufficient documentation

## 2020-05-21 DIAGNOSIS — R1084 Generalized abdominal pain: Secondary | ICD-10-CM | POA: Diagnosis not present

## 2020-05-21 DIAGNOSIS — R1030 Lower abdominal pain, unspecified: Secondary | ICD-10-CM | POA: Diagnosis not present

## 2020-05-21 DIAGNOSIS — R110 Nausea: Secondary | ICD-10-CM | POA: Insufficient documentation

## 2020-05-21 DIAGNOSIS — R197 Diarrhea, unspecified: Secondary | ICD-10-CM | POA: Diagnosis not present

## 2020-05-21 DIAGNOSIS — Z5321 Procedure and treatment not carried out due to patient leaving prior to being seen by health care provider: Secondary | ICD-10-CM | POA: Insufficient documentation

## 2020-05-21 DIAGNOSIS — Z91040 Latex allergy status: Secondary | ICD-10-CM | POA: Diagnosis not present

## 2020-05-21 LAB — COMPREHENSIVE METABOLIC PANEL
ALT: 12 U/L (ref 0–44)
AST: 14 U/L — ABNORMAL LOW (ref 15–41)
Albumin: 3.9 g/dL (ref 3.5–5.0)
Alkaline Phosphatase: 63 U/L (ref 38–126)
Anion gap: 7 (ref 5–15)
BUN: 9 mg/dL (ref 6–20)
CO2: 26 mmol/L (ref 22–32)
Calcium: 9.2 mg/dL (ref 8.9–10.3)
Chloride: 107 mmol/L (ref 98–111)
Creatinine, Ser: 0.75 mg/dL (ref 0.44–1.00)
GFR calc Af Amer: >60 mL/min (ref >60)
GFR calc non Af Amer: >60 mL/min (ref >60)
Glucose, Bld: 89 mg/dL (ref 70–99)
Potassium: 3.8 mmol/L (ref 3.5–5.1)
Sodium: 140 mmol/L (ref 135–145)
Total Bilirubin: 0.5 mg/dL (ref 0.3–1.2)
Total Protein: 7 g/dL (ref 6.5–8.1)

## 2020-05-21 LAB — CBC
HCT: 39.4 % (ref 36.0–46.0)
Hemoglobin: 13.2 g/dL (ref 12.0–15.0)
MCH: 29.9 pg (ref 26.0–34.0)
MCHC: 33.5 g/dL (ref 30.0–36.0)
MCV: 89.1 fL (ref 80.0–100.0)
Platelets: 313 10*3/uL (ref 150–400)
RBC: 4.42 MIL/uL (ref 3.87–5.11)
RDW: 15.6 % — ABNORMAL HIGH (ref 11.5–15.5)
WBC: 17 10*3/uL — ABNORMAL HIGH (ref 4.0–10.5)
nRBC: 0 % (ref 0.0–0.2)

## 2020-05-21 LAB — LIPASE, BLOOD: Lipase: 22 U/L (ref 11–51)

## 2020-05-21 NOTE — ED Triage Notes (Signed)
C/o sudden onset of abdominal pain x 1 hr ago.

## 2020-05-21 NOTE — ED Triage Notes (Signed)
Patient reports lower abdominal pain starting 3.5 hours ago, radiates toward groin. Started while she was rocking her son. Patient states it feels worse than giving birth. Patient says she got bloodwork done at [facility] cone today but didn't want to wait.

## 2020-05-21 NOTE — ED Notes (Signed)
Pt stated they were leaving  

## 2020-05-22 ENCOUNTER — Encounter (HOSPITAL_COMMUNITY): Payer: Self-pay

## 2020-05-22 ENCOUNTER — Emergency Department (HOSPITAL_COMMUNITY)
Admission: EM | Admit: 2020-05-22 | Discharge: 2020-05-22 | Disposition: A | Discharge status: Home / Self Care | Payer: Medicaid Other | Source: Home / Self Care | Attending: Emergency Medicine | Admitting: Emergency Medicine

## 2020-05-22 ENCOUNTER — Emergency Department (HOSPITAL_COMMUNITY): Payer: Medicaid Other

## 2020-05-22 DIAGNOSIS — R1084 Generalized abdominal pain: Secondary | ICD-10-CM

## 2020-05-22 DIAGNOSIS — R109 Unspecified abdominal pain: Secondary | ICD-10-CM | POA: Diagnosis not present

## 2020-05-22 DIAGNOSIS — N3000 Acute cystitis without hematuria: Secondary | ICD-10-CM

## 2020-05-22 LAB — URINALYSIS, ROUTINE W REFLEX MICROSCOPIC
Bilirubin Urine: NEGATIVE
Glucose, UA: NEGATIVE mg/dL
Ketones, ur: 20 mg/dL — AB
Leukocytes,Ua: NEGATIVE
Nitrite: POSITIVE — AB
Protein, ur: NEGATIVE mg/dL
Specific Gravity, Urine: >1.046 — ABNORMAL HIGH (ref 1.005–1.030)
pH: 5 (ref 5.0–8.0)

## 2020-05-22 LAB — I-STAT BETA HCG BLOOD, ED (MC, WL, AP ONLY): I-stat hCG, quantitative: <5 m[IU]/mL (ref <5)

## 2020-05-22 MED ORDER — IBUPROFEN 200 MG PO TABS
600.0000 mg | ORAL_TABLET | Freq: Once | ORAL | Status: AC
  Administered 2020-05-22: 600 mg via ORAL
  Filled 2020-05-22: qty 3

## 2020-05-22 MED ORDER — PHENAZOPYRIDINE HCL 200 MG PO TABS: 200.0000 mg | ORAL_TABLET | Freq: Three times a day (TID) | ORAL | 0 refills | Status: DC

## 2020-05-22 MED ORDER — SODIUM CHLORIDE (PF) 0.9 % IJ SOLN
INTRAMUSCULAR | Status: AC
  Filled 2020-05-22: qty 50

## 2020-05-22 MED ORDER — IOHEXOL 300 MG/ML  SOLN
100.0000 mL | Freq: Once | INTRAMUSCULAR | Status: AC | PRN
  Administered 2020-05-22: 100 mL via INTRAVENOUS

## 2020-05-22 MED ORDER — SULFAMETHOXAZOLE-TRIMETHOPRIM 800-160 MG PO TABS
1.0000 | ORAL_TABLET | Freq: Once | ORAL | Status: AC
  Administered 2020-05-22: 1 via ORAL
  Filled 2020-05-22: qty 1

## 2020-05-22 MED ORDER — IBUPROFEN 600 MG PO TABS: 600.0000 mg | ORAL_TABLET | Freq: Four times a day (QID) | ORAL | 0 refills | Status: DC | PRN

## 2020-05-22 MED ORDER — PHENAZOPYRIDINE HCL 200 MG PO TABS
200.0000 mg | ORAL_TABLET | Freq: Three times a day (TID) | ORAL | Status: DC
  Administered 2020-05-22: 200 mg via ORAL
  Filled 2020-05-22: qty 1

## 2020-05-22 MED ORDER — FENTANYL CITRATE (PF) 100 MCG/2ML IJ SOLN
50.0000 ug | Freq: Once | INTRAMUSCULAR | Status: AC
  Administered 2020-05-22: 50 ug via INTRAVENOUS
  Filled 2020-05-22: qty 2

## 2020-05-22 MED ORDER — SULFAMETHOXAZOLE-TRIMETHOPRIM 800-160 MG PO TABS: 1.0000 | ORAL_TABLET | Freq: Two times a day (BID) | ORAL | 0 refills | Status: AC

## 2020-05-22 NOTE — ED Provider Notes (Signed)
[facility] DEPT Provider Note   CSN: [id number] Arrival date & time: 05/21/20  1829     History Chief Complaint  Patient presents with  . Abdominal Pain    Danielle Carter is a 24 y.o. female.  Sudden onset earlier this evening of generalized abdominal pain with sense of strong cramping in the suprapubic area. No urinary symptoms. She denies vaginal bleeding or discharge. No fever. She reports nausea without vomiting. No constipation issues, reporting loose stools yesterday. No cough, congestion.   The history is provided by the patient. No language interpreter was used.  Abdominal Pain Associated symptoms: nausea   Associated symptoms: no chest pain, no chills, no constipation, no dysuria, no fever, no shortness of breath, no vaginal bleeding, no vaginal discharge and no vomiting        Past Medical History:  Diagnosis Date  . Anemia   . Anxiety   . Depression   . PTSD (post-traumatic stress disorder)     Patient Active Problem List   Diagnosis Date Noted  . History of preterm delivery 09/15/2019  . Nexplanon in place 08/14/2019  . Choroid plexus cyst of fetus on prenatal ultrasound 03/27/2019  . Low grade squamous intraepith lesion on cytologic smear cervix (lgsil) 02/26/2019  . Gastric ulcer 10/15/2013  . Major depression 10/15/2013  . 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 PTSD (post-traumatic stress disorder) 10/15/2013  . 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 DRUG INDUCED ENDOSCOPY       OB History    Gravida  2   Para  2   Term  1   Preterm  1   AB  0   Living  2     SAB      TAB      Ectopic      Multiple  0   Live Births  2           Family History  Problem Relation Age of Onset  . Fibromyalgia Mother   . Pancreatitis Father   . Graves' disease Father   . Depression Father     Social History   Tobacco  Use  . Smoking status: Former Smoker    Types: Cigarettes    Quit date: 12/10/2018    Years since quitting: 1.4  . Smokeless tobacco: Never Used  Vaping Use  . Vaping Use: Never used  Substance Use Topics  . Alcohol use: Never  . Drug use: Never    Home Medications Prior to Admission medications   Medication Sig Start Date End Date Taking? Authorizing Provider  Blood Pressure Monitor KIT 1 each by Does not apply route daily. 02/17/19  Yes Shelly Bombard, MD  Norethindrone Acetate-Ethinyl Estrad-FE (LOESTRIN 24 FE) 1-20 MG-MCG(24) tablet Take 1 tablet by mouth daily. 02/12/20  Yes Shelly Bombard, MD  acetaminophen (TYLENOL) 325 MG tablet Take 2 tablets (650 mg total) by mouth every 4 (four) hours as needed (for pain scale < 4). Patient not taking: Reported on 09/15/2019 08/14/19   Sparacino, Hailey L, DO  butalbital-acetaminophen-caffeine (FIORICET) 50-325-40 MG tablet Take 1 tablet by mouth every 6 (six) hours as needed for headache. Patient not taking: Reported on 08/11/2019 07/03/19 07/02/20  Starr Lake, CNM  calcium carbonate (TUMS - DOSED IN MG ELEMENTAL CALCIUM) 500 MG chewable tablet Chew 1-2 tablets by mouth as needed for indigestion or heartburn.    [provider]  NIFEdipine (PROCARDIA XL) 30 MG 24 hr tablet Take 1 tablet (30 mg total) by mouth daily. Patient not taking: Reported on 09/15/2019 07/13/19   Laury Deep, CNM    Allergies    Abilify [aripiprazole], Pristiq [desvenlafaxine], Rocephin [ceftriaxone sodium in dextrose], and Latex  Review of Systems   Review of Systems  Constitutional: Negative for chills and fever.  HENT: Negative.   Respiratory: Negative.  Negative for shortness of breath.   Cardiovascular: Negative.  Negative for chest pain.  Gastrointestinal: Positive for abdominal pain and nausea. Negative for constipation and vomiting.  Genitourinary: Negative for dysuria, flank pain, vaginal bleeding and vaginal discharge.   Musculoskeletal: Negative.   Skin: Negative.   Neurological: Negative.     Physical Exam Updated Vital Signs BP 125/85   Pulse 97   Temp 98.9 F (37.2 C) (Oral)   Resp 17   Ht _0  (1.6 m)   Wt 56.7 kg   LMP 05/11/2020   SpO2 100%   BMI 22.14 kg/m   Physical Exam Vitals and nursing note reviewed.  Constitutional:      Appearance: She is well-developed.  Pulmonary:     Effort: Pulmonary effort is normal.  Abdominal:     General: Bowel sounds are decreased. There is no distension.     Tenderness: There is generalized abdominal tenderness. There is guarding.  Musculoskeletal:     Cervical back: Normal range of motion.  Skin:    General: Skin is warm and dry.  Neurological:     Mental Status: She is alert and oriented to person, place, and time.     ED Results / Procedures / Treatments   Labs (all labs ordered are listed, but only abnormal results are displayed) Labs Reviewed  URINALYSIS, ROUTINE W REFLEX MICROSCOPIC - Abnormal; Notable for the following components:      Result Value   Specific Gravity, Urine >1.046 (*)    Hgb urine dipstick SMALL (*)    Ketones, ur 20 (*)    Nitrite POSITIVE (*)    Bacteria, UA RARE (*)    All other components within normal limits  I-STAT BETA HCG BLOOD, ED ([facility], [facility], [facility] ONLY)   Results for orders placed or performed during the hospital encounter of 05/22/20  Urinalysis, Routine w reflex microscopic Urine, Clean Catch  Result Value Ref Range   Color, Urine YELLOW YELLOW   APPearance CLEAR CLEAR   Specific Gravity, Urine >1.046 (H) 1.005 - 1.030   pH 5.0 5.0 - 8.0   Glucose, UA NEGATIVE NEGATIVE mg/dL   Hgb urine dipstick SMALL (A) NEGATIVE   Bilirubin Urine NEGATIVE NEGATIVE   Ketones, ur 20 (A) NEGATIVE mg/dL   Protein, ur NEGATIVE NEGATIVE mg/dL   Nitrite POSITIVE (A) NEGATIVE   Leukocytes,Ua NEGATIVE NEGATIVE   RBC / HPF 0-5 0 - 5 RBC/hpf   WBC, UA 6-10 0 - 5 WBC/hpf   Bacteria, UA RARE (A) NONE SEEN   Squamous  Epithelial / LPF 0-5 0 - 5   Mucus PRESENT   I-Stat beta hCG blood, ED  Result Value Ref Range   I-stat hCG, quantitative <5.0 <5 mIU/mL   Comment 3            EKG None  Radiology CT ABDOMEN PELVIS W CONTRAST  Result Date: 05/22/2020 CLINICAL DATA:  24 year old female with history of acute nonlocalized abdominal pain starting 3.5 hours ago radiating toward the groin. EXAM: CT ABDOMEN AND PELVIS WITH CONTRAST TECHNIQUE: Multidetector CT imaging  of the abdomen and pelvis was performed using the standard protocol following bolus administration of intravenous contrast. CONTRAST:  139m OMNIPAQUE IOHEXOL 300 MG/ML  SOLN COMPARISON:  No priors. FINDINGS: Lower chest: Unremarkable. Hepatobiliary: Low-attenuation in segment 4A adjacent to the falciform ligament which likely represents a benign perfusion anomaly or mild proteinaceous cyst in this young patient. No other suspicious hepatic lesions. No intra or extrahepatic biliary ductal dilatation. Pancreas: No pancreatic mass. No pancreatic ductal dilatation. No pancreatic or peripancreatic fluid collections or inflammatory changes. Spleen: Unremarkable. Adrenals/Urinary Tract: Bilateral kidneys and adrenal glands are normal in appearance. No hydroureteronephrosis. Urinary bladder is normal in appearance. Stomach/Bowel: Normal appearance of the stomach. No pathologic dilatation of small bowel or colon. Normal appendix. Vascular/Lymphatic: No significant atherosclerotic disease, aneurysm or dissection noted in the abdominal or pelvic vasculature. No lymphadenopathy noted in the abdomen or pelvis. Reproductive: Uterus and ovaries are unremarkable in appearance. Other: Trace volume of free fluid in the cul-de-sac, presumably physiologic in this young female patient. No other larger volume of ascites. No pneumoperitoneum. Musculoskeletal: There are no aggressive appearing lytic or blastic lesions noted in the visualized portions of the skeleton. IMPRESSION: 1. No  acute findings are noted in the abdomen or pelvis to account for the patient's symptoms. Incidental findings, as above. Electronically Signed   By: DVinnie LangtonM.D.   On: 05/22/2020 05:02    Procedures Procedures (including critical care time)  Medications Ordered in ED Medications  sodium chloride (PF) 0.9 % injection (has no administration in time range)  sulfamethoxazole-trimethoprim (BACTRIM DS) 800-160 MG per tablet 1 tablet (has no administration in time range)  phenazopyridine (PYRIDIUM) tablet 200 mg (has no administration in time range)  fentaNYL (SUBLIMAZE) injection 50 mcg (50 mcg Intravenous Given 05/22/20 0346)  iohexol (OMNIPAQUE) 300 MG/ML solution 100 mL (100 mLs Intravenous Contrast Given 05/22/20 0445)    ED Course  I have reviewed the triage vital signs and the nursing notes.  Pertinent labs & imaging results that were available during my care of the patient were reviewed by me and considered in my medical decision making (see chart for details).    MDM Rules/Calculators/A&P                          Patient to ED with ss/sxs as per HPI.   She appears significantly uncomfortable on initial exam. IV started and pain medication provided. Labs are significant for leukocytosis of 17. CT scan ordered, UA pending.   Pain medication provided significant relief. She appears much more comfortable, able to stretch out on the bed. CT scan is negative.    UA is nitrite positive, significantly concentrated. Will start on Abx and pyridium and encourage PCP follow up.  Final Clinical Impression(s) / ED Diagnoses Final diagnoses:  None   1. Acute cystitis  Rx / DC Orders ED Discharge Orders    None       UCharlann Lange PHershal Coria08/22/21 0715    DVeryl Speak MD 05/24/20 [phone]

## 2020-05-22 NOTE — Discharge Instructions (Addendum)
Take medications as prescribed for urinary tract infection. Follow up with your doctor for recheck if symptoms worsen.

## 2020-05-24 ENCOUNTER — Telehealth: Payer: Self-pay | Admitting: *Deleted

## 2020-05-24 NOTE — Telephone Encounter (Signed)
Danielle Carter presented to the ED and left before being seen by the provider on 05/21/20. The patient has been enrolled in an automated general discharge outreach program and 2 attempts to contact the patient will be made to follow up on their ED visit and subsequent needs. The care management team is available to provide assistance to this patient at any time.   Burnard Bunting, RN, BSN, CCRN Patient Engagement Center [phone]

## 2020-05-25 ENCOUNTER — Telehealth: Payer: Self-pay | Admitting: *Deleted

## 2020-05-25 NOTE — Telephone Encounter (Signed)
Contacted patient to complete transition of care assessment:  Transition Care Management Follow-up Telephone Call  . Medicaid Managed Care Transition Call Status:MM [facility] Call Made  . Date of discharge and from where: [facility], 05/22/20  . How have you been since you were released from the hospital? "much better"  . Any questions or concerns? A long wait time 15 hours total between getting there and being seen  TOC TRACKING USE FOR MANAGED MEDICAID REPORTING  Items Reviewed: Marland Kitchen Did the pt receive and understand the discharge instructions provided? Yes    . Medications obtained and verified? Yes    . Any new allergies since your discharge?  Not sure. Patient noticed rash on back on 05/24/20 since taking new medication; patient directed to ED/UC for evaluation; she says the area itches and tingles; took benadryl and applied hydrocortisone cream. These did help symptoms. She is not sure if the rash is from new meds or being outside with her children  . Dietary orders reviewed?  No  . Do you have support at home? Yes, family  Functional Questionnaire: (I = Independent and D = Dependent)  ADLs: Independent Bathing/Dressing:Independent Meal Prep: Independent Eating: Independent Maintaining continence: Independent Transferring/Ambulation: Independent Managing Meds: Independent   Follow up appointments reviewed:   PCP Hospital f/u appt confirmed? Yes  Scheduled to be seen at [facility] on 05/26/20 @ 0930  Specialist Hospital f/u appt confirmed? n/a   Are transportation arrangements needed? No   If their condition worsens, is the pt aware to call PCP or go to the EmergencyDept.?  Yes  Was the patient provided with contact information for the PCP's office or ED? Yes  Was to pt encouraged to call back with questions or concerns?  Yes  Burnard Bunting, RN, BSN, CCRN Patient Engagement Center [phone]

## 2020-05-26 ENCOUNTER — Other Ambulatory Visit: Payer: Self-pay

## 2020-05-26 ENCOUNTER — Ambulatory Visit (INDEPENDENT_AMBULATORY_CARE_PROVIDER_SITE_OTHER): Payer: Medicaid Other | Admitting: Nurse Practitioner

## 2020-05-26 VITALS — BP 102/60 | HR 72 | Temp 97.7°F | Ht 63.0 in | Wt 125.0 lb

## 2020-05-26 DIAGNOSIS — R21 Rash and other nonspecific skin eruption: Secondary | ICD-10-CM | POA: Insufficient documentation

## 2020-05-26 DIAGNOSIS — N3090 Cystitis, unspecified without hematuria: Secondary | ICD-10-CM | POA: Insufficient documentation

## 2020-05-26 DIAGNOSIS — L538 Other specified erythematous conditions: Secondary | ICD-10-CM | POA: Insufficient documentation

## 2020-05-26 MED ORDER — CLOTRIMAZOLE-BETAMETHASONE 1-0.05 % EX CREA: 1.0000 "application " | TOPICAL_CREAM | Freq: Two times a day (BID) | CUTANEOUS | 0 refills | Status: DC

## 2020-05-26 MED ORDER — CIPROFLOXACIN HCL 250 MG PO TABS: 250.0000 mg | ORAL_TABLET | Freq: Two times a day (BID) | ORAL | 0 refills | Status: AC

## 2020-05-26 NOTE — Progress Notes (Signed)
$'@Patient'S$  ID: Danielle Carter, female    DOB: Jul 07, 1996, 24 y.o.   MRN: [id number]  Chief Complaint  Patient presents with  . Establish Care    Patches on dry skin on abdominal into your back. UTI in ED antibiotics given. Rash on back took benadryl, put hydrocortisone    Referring provider: No ref. provider found   24 year old female with history of anxiety, depression, and PTSD.   HPI  Patient presents today for transition of care visit.  Patient was seen in the ED on 05/21/2020 for acute lower abdominal pain.  CT of abdomen was clear.  She was treated and discharged.  She was never admitted.  Patient was treated for acute cystitis with Bactrim.  She states that after 2 days of taking Bactrim she did develop a rash to her back and quit taking the medication.  She has been taking Benadryl and applying hydrocortisone cream to her back.  This has helped to clear up the rash.  Patient has been staying well-hydrated.  She states that she is much improved but is still having lower abdominal pain.  Patient also complains today of a rash that she has had on her abdomen for several months.  She states that it is spreading.  She has been using hydrocortisone cream to this rash without relief.  Patient does not currently have a PCP.  We are arranging appointment with a new PCP for her today during her visit.  She can follow-up with her new PCP in 2 weeks for rash and cystitis.  Denies f/c/s, n/v/d, hemoptysis, PND, chest pain or edema.      Allergies  Allergen Reactions  . Abilify [Aripiprazole]   . Pristiq [Desvenlafaxine]   . Rocephin [Ceftriaxone Sodium In Dextrose] Hives  . Latex Rash    Immunization History  Administered Date(s) Administered  . Influenza,inj,Quad PF,6+ Mos 06/10/2019  . Tdap 06/10/2019    Past Medical History:  Diagnosis Date  . Anemia   . Anxiety   . Depression   . PTSD (post-traumatic stress disorder)     Tobacco History: Social History   Tobacco Use    Smoking Status Current Every Day Smoker  . Packs/day: 0.50  . Types: Cigarettes  . Last attempt to quit: 12/10/2018  . Years since quitting: 1.4  Smokeless Tobacco Never Used   Ready to quit: Not Answered Counseling given: Not Answered   Outpatient Encounter Medications as of 05/26/2020  Medication Sig  . acetaminophen (TYLENOL) 325 MG tablet Take 2 tablets (650 mg total) by mouth every 4 (four) hours as needed (for pain scale < 4).  . Blood Pressure Monitor KIT 1 each by Does not apply route daily.  Marland Kitchen ibuprofen (ADVIL) 600 MG tablet Take 1 tablet (600 mg total) by mouth every 6 (six) hours as needed.  . phenazopyridine (PYRIDIUM) 200 MG tablet Take 1 tablet (200 mg total) by mouth 3 (three) times daily.  Marland Kitchen sulfamethoxazole-trimethoprim (BACTRIM DS) 800-160 MG tablet Take 1 tablet by mouth 2 (two) times daily for 5 days.  . ciprofloxacin (CIPRO) 250 MG tablet Take 1 tablet (250 mg total) by mouth 2 (two) times daily for 3 days.  . clotrimazole-betamethasone (LOTRISONE) cream Apply 1 application topically 2 (two) times daily.  . [DISCONTINUED] butalbital-acetaminophen-caffeine (FIORICET) 50-325-40 MG tablet Take 1 tablet by mouth every 6 (six) hours as needed for headache. (Patient not taking: Reported on 08/11/2019)  . [DISCONTINUED] calcium carbonate (TUMS - DOSED IN MG ELEMENTAL CALCIUM) 500 MG  chewable tablet Chew 1-2 tablets by mouth as needed for indigestion or heartburn.  . [DISCONTINUED] NIFEdipine (PROCARDIA XL) 30 MG 24 hr tablet Take 1 tablet (30 mg total) by mouth daily. (Patient not taking: Reported on 09/15/2019)  . [DISCONTINUED] Norethindrone Acetate-Ethinyl Estrad-FE (LOESTRIN 24 FE) 1-20 MG-MCG(24) tablet Take 1 tablet by mouth daily. (Patient not taking: Reported on 05/26/2020)   No facility-administered encounter medications on file as of 05/26/2020.     Review of Systems  Review of Systems  Constitutional: Negative.  Negative for fever.  HENT: Negative.    Respiratory: Negative for cough and shortness of breath.   Cardiovascular: Negative.   Gastrointestinal: Positive for abdominal pain.  Skin: Positive for rash.  Allergic/Immunologic: Negative.   Neurological: Negative.   Psychiatric/Behavioral: Negative.        Physical Exam  BP 102/60 (BP Location: Right Arm)   Pulse 72   Temp 97.7 F (36.5 C)   Ht $R'5\' 3"'ry$  (1.6 m)   Wt 125 lb (56.7 kg)   LMP 05/11/2020   SpO2 95%   BMI 22.14 kg/m   Wt Readings from Last 5 Encounters:  05/26/20 125 lb (56.7 kg)  05/21/20 125 lb (56.7 kg)  05/21/20 125 lb (56.7 kg)  05/07/20 130 lb (59 kg)  02/12/20 130 lb (59 kg)     Physical Exam Vitals and nursing note reviewed.  Constitutional:      General: She is not in acute distress.    Appearance: She is well-developed.  Cardiovascular:     Rate and Rhythm: Normal rate and regular rhythm.  Pulmonary:     Effort: Pulmonary effort is normal. No respiratory distress.     Breath sounds: Normal breath sounds.  Skin:    Findings: Rash present. Rash is macular and papular.          Comments: Macular rash noted to abdomen  Maculopapular rash noted to back  Neurological:     Mental Status: She is alert and oriented to person, place, and time.     Imaging: CT ABDOMEN PELVIS W CONTRAST  Result Date: 05/22/2020 CLINICAL DATA:  24 year old female with history of acute nonlocalized abdominal pain starting 3.5 hours ago radiating toward the groin. EXAM: CT ABDOMEN AND PELVIS WITH CONTRAST TECHNIQUE: Multidetector CT imaging of the abdomen and pelvis was performed using the standard protocol following bolus administration of intravenous contrast. CONTRAST:  160mL OMNIPAQUE IOHEXOL 300 MG/ML  SOLN COMPARISON:  No priors. FINDINGS: Lower chest: Unremarkable. Hepatobiliary: Low-attenuation in segment 4A adjacent to the falciform ligament which likely represents a benign perfusion anomaly or mild proteinaceous cyst in this young patient. No other  suspicious hepatic lesions. No intra or extrahepatic biliary ductal dilatation. Pancreas: No pancreatic mass. No pancreatic ductal dilatation. No pancreatic or peripancreatic fluid collections or inflammatory changes. Spleen: Unremarkable. Adrenals/Urinary Tract: Bilateral kidneys and adrenal glands are normal in appearance. No hydroureteronephrosis. Urinary bladder is normal in appearance. Stomach/Bowel: Normal appearance of the stomach. No pathologic dilatation of small bowel or colon. Normal appendix. Vascular/Lymphatic: No significant atherosclerotic disease, aneurysm or dissection noted in the abdominal or pelvic vasculature. No lymphadenopathy noted in the abdomen or pelvis. Reproductive: Uterus and ovaries are unremarkable in appearance. Other: Trace volume of free fluid in the cul-de-sac, presumably physiologic in this young female patient. No other larger volume of ascites. No pneumoperitoneum. Musculoskeletal: There are no aggressive appearing lytic or blastic lesions noted in the visualized portions of the skeleton. IMPRESSION: 1. No acute findings are noted in the abdomen  or pelvis to account for the patient's symptoms. Incidental findings, as above. Electronically Signed   By: Vinnie Langton M.D.   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 Patient states patchy areas are spreading - will trial Lotrisone cream to rule out fungal involvement.   Please use johnson and johnson baby shampoo to bath  will order Lotrisone cream - concerned this could be  fungal   Covid Vaccine:  Info given on vaccine sites   Follow up:  Will make an appointment with Dionisio David as new PCP - please follow up up on rash and cystitis        Fenton Foy, NP 05/26/2020

## 2020-05-26 NOTE — Assessment & Plan Note (Signed)
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 Patient states patchy areas are spreading - will trial Lotrisone cream to rule out fungal involvement.   Please use johnson and johnson baby shampoo to bath  will order Lotrisone cream - concerned this could be fungal   Covid Vaccine:  Info given on vaccine sites   Follow up:  Will make an appointment with Thad Ranger as new PCP - please follow up up on rash and cystitis

## 2020-05-26 NOTE — Patient Instructions (Addendum)
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 Patient states patchy areas are spreading - will trial Lotrisone cream to rule out fungal involvement.   Please use johnson and johnson baby shampoo to bath  will order Lotrisone cream - concerned this could be fungal   Covid Vaccine:  Info given on vaccine sites   Follow up:  Will make an appointment with Thad Ranger as new PCP - please follow up up on rash and cystitis     Urinary Tract Infection, Adult  A urinary tract infection (UTI) is an infection of any part of the urinary tract. The urinary tract includes the kidneys, ureters, bladder, and urethra. These organs make, store, and get rid of urine in the body. Your health care provider may use other names to describe the infection. An upper UTI affects the ureters and kidneys (pyelonephritis). A lower UTI affects the bladder (cystitis) and urethra (urethritis). What are the causes? Most urinary tract infections are caused by bacteria in your genital area, around the entrance to your urinary tract (urethra). These bacteria grow and cause inflammation of your urinary tract. What increases the risk? You are more likely to develop this condition if:  You have a urinary catheter that stays in place (indwelling).  You are not able to control when you urinate or have a bowel movement (you have incontinence).  You are female and you: ? Use a spermicide or diaphragm for birth control. ? Have low estrogen levels. ? Are pregnant.  You have certain genes that increase your risk  (genetics).  You are sexually active.  You take antibiotic medicines.  You have a condition that causes your flow of urine to slow down, such as: ? An enlarged prostate, if you are female. ? Blockage in your urethra (stricture). ? A kidney stone. ? A nerve condition that affects your bladder control (neurogenic bladder). ? Not getting enough to drink, or not urinating often.  You have certain medical conditions, such as: ? Diabetes. ? A weak disease-fighting system (immunesystem). ? Sickle cell disease. ? Gout. ? Spinal cord injury. What are the signs or symptoms? Symptoms of this condition include:  Needing to urinate right away (urgently).  Frequent urination or passing small amounts of urine frequently.  Pain or burning with urination.  Blood in the urine.  Urine that smells bad or unusual.  Trouble urinating.  Cloudy urine.  Vaginal discharge, if you are female.  Pain in the abdomen or the lower back. You may also have:  Vomiting or a decreased appetite.  Confusion.  Irritability or tiredness.  A fever.  Diarrhea. The first symptom in older adults may be confusion. In some cases, they may not have any symptoms until the infection has worsened. How is this diagnosed? This condition is diagnosed based on your medical history and a physical exam. You may also have other tests, including:  Urine tests.  Blood tests.  Tests for sexually transmitted infections (STIs). If you have had more than one UTI, a cystoscopy or imaging studies may be  done to determine the cause of the infections. How is this treated? Treatment for this condition includes:  Antibiotic medicine.  Over-the-counter medicines to treat discomfort.  Drinking enough water to stay hydrated. If you have frequent infections or have other conditions such as a kidney stone, you may need to see a health care provider who specializes in the urinary tract (urologist). In rare cases, urinary  tract infections can cause sepsis. Sepsis is a life-threatening condition that occurs when the body responds to an infection. Sepsis is treated in the hospital with IV antibiotics, fluids, and other medicines. Follow these instructions at home:  Medicines  Take over-the-counter and prescription medicines only as told by your health care provider.  If you were prescribed an antibiotic medicine, take it as told by your health care provider. Do not stop using the antibiotic even if you start to feel better. General instructions  Make sure you: ? Empty your bladder often and completely. Do not hold urine for long periods of time. ? Empty your bladder after sex. ? Wipe from front to back after a bowel movement if you are female. Use each tissue one time when you wipe.  Drink enough fluid to keep your urine pale yellow.  Keep all follow-up visits as told by your health care provider. This is important. Contact a health care provider if:  Your symptoms do not get better after 1-2 days.  Your symptoms go away and then return. Get help right away if you have:  Severe pain in your back or your lower abdomen.  A fever.  Nausea or vomiting. Summary  A urinary tract infection (UTI) is an infection of any part of the urinary tract, which includes the kidneys, ureters, bladder, and urethra.  Most urinary tract infections are caused by bacteria in your genital area, around the entrance to your urinary tract (urethra).  Treatment for this condition often includes antibiotic medicines.  If you were prescribed an antibiotic medicine, take it as told by your health care provider. Do not stop using the antibiotic even if you start to feel better.  Keep all follow-up visits as told by your health care provider. This is important. This information is not intended to replace advice given to you by your health care provider. Make sure you discuss any questions you have with your health care  provider. Document Revised: 09/05/2018 Document Reviewed: 03/28/2018 Elsevier Patient Education  2020 [employer].

## 2020-05-28 LAB — I-STAT BETA HCG BLOOD, ED (MC, WL, AP ONLY): I-stat hCG, quantitative: <5 m[IU]/mL (ref <5)

## 2020-06-11 ENCOUNTER — Ambulatory Visit: Payer: Medicaid Other | Admitting: Nurse Practitioner

## 2020-09-02 ENCOUNTER — Ambulatory Visit: Payer: Medicaid Other

## 2020-09-03 ENCOUNTER — Ambulatory Visit (INDEPENDENT_AMBULATORY_CARE_PROVIDER_SITE_OTHER): Payer: Medicaid Other

## 2020-09-03 ENCOUNTER — Other Ambulatory Visit: Payer: Self-pay

## 2020-09-03 DIAGNOSIS — Z3200 Encounter for pregnancy test, result unknown: Secondary | ICD-10-CM | POA: Diagnosis not present

## 2020-09-03 LAB — POCT URINE PREGNANCY: Preg Test, Ur: POSITIVE — AB

## 2020-09-03 NOTE — Progress Notes (Signed)
Patient was assessed and managed by nursing staff during this encounter. I have reviewed the chart and agree with the documentation and plan. I have also made any necessary editorial changes.  Warden Fillers, MD 09/03/2020 11:37 AM

## 2020-09-03 NOTE — Progress Notes (Signed)
..   [name] presents today for UPT. She has no unusual complaints. LMP:07-09-20     OBJECTIVE: Appears well, in no apparent distress.  OB History    Gravida  3   Para  2   Term  1   Preterm  1   AB  0   Living  2     SAB      TAB      Ectopic      Multiple  0   Live Births  2          Home UPT Result:Positive  In-Office UPT result:Positive I have reviewed the patient's medical, obstetrical, social, and family histories, and medications.   ASSESSMENT: Positive pregnancy test  PLAN Prenatal care to be completed at: [facility]

## 2020-09-29 ENCOUNTER — Ambulatory Visit: Payer: Medicaid Other

## 2020-09-29 DIAGNOSIS — O219 Vomiting of pregnancy, unspecified: Secondary | ICD-10-CM

## 2020-09-29 DIAGNOSIS — Z3490 Encounter for supervision of normal pregnancy, unspecified, unspecified trimester: Secondary | ICD-10-CM

## 2020-09-29 DIAGNOSIS — O0990 Supervision of high risk pregnancy, unspecified, unspecified trimester: Secondary | ICD-10-CM | POA: Insufficient documentation

## 2020-09-29 MED ORDER — BLOOD PRESSURE MONITOR KIT: 1.0000 | PACK | 0 refills | Status: DC

## 2020-09-29 MED ORDER — PROMETHAZINE HCL 25 MG PO TABS: 25.0000 mg | ORAL_TABLET | Freq: Four times a day (QID) | ORAL | 1 refills | Status: DC | PRN

## 2020-09-29 NOTE — Progress Notes (Signed)
PRENATAL INTAKE SUMMARY  Danielle Carter presents today New OB Nurse Interview.  OB History    Gravida  3   Para  2   Term  1   Preterm  1   AB  0   Living  2     SAB      IAB      Ectopic      Multiple  0   Live Births  2          I have reviewed the patient's medical, obstetrical, social, and family histories, medications, and available lab results.  SUBJECTIVE She complains of nausea with vomiting.  OBJECTIVE Initial Physical Exam (New OB)     ASSESSMENT Normal pregnancy  PLAN Prenatal care OB Pnl/HIV  OB Urine Culture GC/CT HgbEval B/P Cuff sent  Enrolled [employer].   If CHTN - P/C Ratio and CMP

## 2020-09-29 NOTE — Progress Notes (Signed)
Patient was assessed and managed by nursing staff during this encounter. I have reviewed the chart and agree with the documentation and plan. I have also made any necessary editorial changes.  Warden Fillers, MD 09/29/2020 3:06 PM

## 2020-10-02 NOTE — L&D Delivery Note (Signed)
Delivery Note At 1720 a viable female infant was delivered via SVD, presentation: ROA. APGAR: 6, 9; weight pending.   Placenta status: spontaneously delivered intact with gentle cord traction. Fundus firm with massage and Pitocin.   Anesthesia: epidural Lacerations: right labial Suture used for repair: 3-0 Vicryl rapide Est. Blood Loss (mL): 50 Placenta to LD Complications none Cord ph n/a   Mom to postpartum. Baby to Couplet care / Skin to Skin.    Donette Larry, CNM 04/09/2021 5:50 PM

## 2020-10-06 ENCOUNTER — Encounter: Payer: Medicaid Other | Admitting: Obstetrics and Gynecology

## 2020-10-13 DIAGNOSIS — Z3490 Encounter for supervision of normal pregnancy, unspecified, unspecified trimester: Secondary | ICD-10-CM | POA: Diagnosis not present

## 2020-10-20 DIAGNOSIS — H18891 Other specified disorders of cornea, right eye: Secondary | ICD-10-CM | POA: Diagnosis not present

## 2020-10-20 DIAGNOSIS — T1501XA Foreign body in cornea, right eye, initial encounter: Secondary | ICD-10-CM | POA: Diagnosis not present

## 2020-10-26 ENCOUNTER — Other Ambulatory Visit (HOSPITAL_COMMUNITY)
Admission: RE | Admit: 2020-10-26 | Discharge: 2020-10-26 | Disposition: A | Discharge status: Home / Self Care | Payer: Medicaid Other | Source: Ambulatory Visit | Attending: Obstetrics and Gynecology | Admitting: Obstetrics and Gynecology

## 2020-10-26 ENCOUNTER — Other Ambulatory Visit: Payer: Self-pay

## 2020-10-26 ENCOUNTER — Ambulatory Visit (INDEPENDENT_AMBULATORY_CARE_PROVIDER_SITE_OTHER): Payer: Medicaid Other | Admitting: Advanced Practice Midwife

## 2020-10-26 VITALS — BP 115/74 | HR 96 | Wt 115.4 lb

## 2020-10-26 DIAGNOSIS — U071 COVID-19: Secondary | ICD-10-CM | POA: Diagnosis not present

## 2020-10-26 DIAGNOSIS — Z124 Encounter for screening for malignant neoplasm of cervix: Secondary | ICD-10-CM

## 2020-10-26 DIAGNOSIS — Z3A15 15 weeks gestation of pregnancy: Secondary | ICD-10-CM

## 2020-10-26 DIAGNOSIS — Z3490 Encounter for supervision of normal pregnancy, unspecified, unspecified trimester: Secondary | ICD-10-CM | POA: Insufficient documentation

## 2020-10-26 DIAGNOSIS — O98512 Other viral diseases complicating pregnancy, second trimester: Secondary | ICD-10-CM | POA: Diagnosis not present

## 2020-10-26 DIAGNOSIS — Z3482 Encounter for supervision of other normal pregnancy, second trimester: Secondary | ICD-10-CM | POA: Diagnosis not present

## 2020-10-26 DIAGNOSIS — O98511 Other viral diseases complicating pregnancy, first trimester: Secondary | ICD-10-CM

## 2020-10-26 DIAGNOSIS — Z3A18 18 weeks gestation of pregnancy: Secondary | ICD-10-CM

## 2020-10-26 DIAGNOSIS — Z7189 Other specified counseling: Secondary | ICD-10-CM

## 2020-10-26 NOTE — Progress Notes (Signed)
NOB NOB Intake Already completed on 09/29/20.  Pt has not received COVID -19 vaccine   Pt has not picked up B/P cuff yet. Aware Rx has been sent .  Last pap: 02/12/2019  CIN1 + HPV LSIL   Genetic Screening: Desired   CC: Recovering from  COVID- 19  still having SOB. Tested + at a mobile lab site.  Concerned about getting U/S

## 2020-10-26 NOTE — Progress Notes (Signed)
Subjective:   Danielle Carter is a 25 y.o. O2V0350 at [redacted]w[redacted]d by LMP being seen today for her first obstetrical visit.  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 Patient does intend to breast feed. Pregnancy history fully reviewed.  Patient reports nausea and SOB since COVID and fatigue.  HISTORY: OB History  Gravida Para Term Preterm AB Living  $Remov'3 2 1 1 'CVYpAO$ 0 2  SAB IAB Ectopic Multiple Live Births  0 0 0 0 2    # Outcome Date GA Lbr Len/2nd Weight Sex Delivery Anes PTL Lv  3 Current           2 Term 08/13/19 [redacted]w[redacted]d 16:05 / 00:35 7 lb 6.5 oz (3.359 kg) M Vag-Spont EPI  LIV     Name: Burgard,BOY Anastazia     Apgar1: 8  Apgar5: 9  1 Preterm 08/21/14 [redacted]w[redacted]d  5 lb 15 oz (2.693 kg) F Vag-Spont EPI  LIV   Past Medical History:  Diagnosis Date  . Anemia   . Anxiety   . Depression   . PTSD (post-traumatic stress disorder)    Past Surgical History:  Procedure Laterality Date  . DRUG INDUCED ENDOSCOPY     Family History  Problem Relation Age of Onset  . Fibromyalgia Mother   . Pancreatitis Father   . Graves' disease Father   . Depression Father    Social History   Tobacco Use  . Smoking status: Current Every Day Smoker    Packs/day: 0.50    Types: Cigarettes    Last attempt to quit: 12/10/2018    Years since quitting: 1.8  . Smokeless tobacco: Never Used  Vaping Use  . Vaping Use: Never used  Substance Use Topics  . Alcohol use: Never  . Drug use: Never   Allergies  Allergen Reactions  . Abilify [Aripiprazole]   . Pristiq [Desvenlafaxine]   . Rocephin [Ceftriaxone Sodium In Dextrose] Hives  . Latex Rash   Current  Outpatient Medications on File Prior to Visit  Medication Sig Dispense Refill  . Prenatal Vit-Fe Fumarate-FA (MULTIVITAMIN-PRENATAL) 27-0.8 MG TABS tablet Take 1 tablet by mouth daily at 12 noon.    Marland Kitchen acetaminophen (TYLENOL) 325 MG tablet Take 2 tablets (650 mg total) by mouth every 4 (four) hours as needed (for pain scale < 4). (Patient not taking: Reported on 09/03/2020) 30 tablet 0  . Blood Pressure Monitor KIT 1 each by Does not apply route daily. (Patient not taking: Reported on 09/03/2020) 1 each 0  . Blood Pressure Monitor KIT 1 Device by Does not apply route once a week. To be monitored Regularly at home. (Patient not taking: Reported on 10/26/2020) 1 kit 0  . clotrimazole-betamethasone (LOTRISONE) cream Apply 1 application topically 2 (two) times daily. (Patient not taking: Reported on 09/03/2020) 30 g 0  . ibuprofen (ADVIL) 600 MG tablet Take 1 tablet (600 mg total) by mouth every 6 (six) hours as needed. (Patient not taking: Reported on 09/03/2020) 30 tablet 0  . phenazopyridine (PYRIDIUM) 200 MG tablet Take 1 tablet (200 mg total) by mouth 3 (three) times daily. (Patient not taking: Reported on 09/03/2020) 6 tablet 0  . promethazine (PHENERGAN) 25 MG tablet Take 1 tablet (25 mg  total) by mouth every 6 (six) hours as needed for nausea or vomiting. (Patient not taking: Reported on 10/26/2020) 30 tablet 1   No current facility-administered medications on file prior to visit.     Indications for ASA therapy (per uptodate) One of the following: Previous pregnancy with preeclampsia, especially early onset and with an adverse outcome No Multifetal gestation No Chronic hypertension No Type 1 or 2 diabetes mellitus No Chronic kidney disease No Autoimmune disease (antiphospholipid syndrome, systemic lupus erythematosus) No   Two or more of the following: Nulliparity No Obesity (body mass index >30 kg/m2) No Family history of preeclampsia in mother or sister No Age ?35 years No Sociodemographic  characteristics (African American race, low socioeconomic level) No Personal risk factors (eg, previous pregnancy with low birth weight or small for gestational age infant, previous adverse pregnancy outcome [eg, stillbirth], interval >10 years between pregnancies) No   Indications for early 1 hour GTT (per uptodate)  BMI >25 (>23 in Asian women) AND one of the following No, BMI 19   Exam   Vitals:   10/26/20 1430  BP: 115/74  Pulse: 96  Weight: 115 lb 6.4 oz (52.3 kg)   Fetal Heart Rate (bpm): 156  VS reviewed, nursing note reviewed,  Constitutional: well developed, well nourished, no distress HEENT: normocephalic CV: normal rate Pulm/chest Reza: normal effort Breast Exam:  Deferred Abdomen: soft Neuro: alert and oriented x 3 Skin: warm, dry Psych: affect normal Pelvic exam: Cervix pink, visually closed, without lesion, scant white creamy discharge, vaginal walls and external genitalia normal    Assessment:   Pregnancy: [id number] Patient Active Problem List   Diagnosis Date Noted  . Supervision of normal pregnancy, antepartum 09/29/2020  . History of preterm delivery 09/15/2019  . Low grade squamous intraepith lesion on cytologic smear cervix (lgsil) 02/26/2019  . Gastric ulcer 10/15/2013  . Major depression 10/15/2013  . 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 PTSD (post-traumatic stress disorder) 10/15/2013  . Cafe-au-lait spots 03/13/2011     Plan:  1. Encounter for supervision of normal pregnancy, antepartum, unspecified gravidity --Anticipatory guidance about next visits/weeks of pregnancy given. --Next visit in 4 weeks in the office  - CBC/D/Plt+RPR+Rh+ABO+Rub Ab... - Culture, OB Urine - Genetic Screening - Cervicovaginal ancillary only( [place]) - AFP, Serum, Open Spina Bifida - [place] MFM OB COMP + 14 WK; Future  COVID-19 Vaccine Counseling: The patient was counseled on the potential benefits and lack of known  risks of COVID vaccination, during pregnancy and breastfeeding, during today's visit. The patient's questions and concerns were addressed today, including safety of the vaccination and potential side effects as they have been published by ACOG and [facility]. The patient has been informed that there have not been any documented vaccine related injuries, deaths or birth defects to infant or mom after receiving the COVID-19 vaccine to date. The patient has been made aware that although she is not at increased risk of contracting COVID-19 during pregnancy, she is at increased risk of developing severe disease and complications if she contracts COVID-19 while pregnant. All patient questions were addressed during our visit today. The patient is still unsure of her decision for vaccination.       2. Screening for cervical cancer --Abnormal Pap in 2021 with LSIL - Cytology - PAP  3. [redacted] weeks gestation of pregnancy   4. COVID-19 affecting pregnancy in first trimester --Some residual symptoms, SOB, taste issues, fatigue  - [place] MFM OB COMP + 14  WK; Future   Initial labs drawn. Continue prenatal vitamins. Discussed and offered genetic screening options, including Quad screen/AFP, NIPS testing, and option to decline testing. Benefits/risks/alternatives reviewed. Pt aware that anatomy US is form of genetic screening with lower accuracy in detecting trisomies than blood work.  Pt chooses genetic screening today. NIPS: ordered. Ultrasound discussed; fetal anatomic survey: ordered. Problem list reviewed and updated. The nature of [place] with multiple MDs and other Advanced Practice Providers was explained to patient; also emphasized that residents, students are part of our team. Routine obstetric precautions reviewed. Return in about 4 weeks (around 11/23/2020).   Fatima Blank, CNM 10/26/20 5:28 PM

## 2020-10-27 LAB — CERVICOVAGINAL ANCILLARY ONLY
Bacterial Vaginitis (gardnerella): NEGATIVE
Candida Glabrata: NEGATIVE
Candida Vaginitis: POSITIVE — AB
Chlamydia: NEGATIVE
Comment: NEGATIVE
Comment: NEGATIVE
Comment: NEGATIVE
Comment: NEGATIVE
Comment: NEGATIVE
Comment: NORMAL
Neisseria Gonorrhea: NEGATIVE
Trichomonas: NEGATIVE

## 2020-10-28 LAB — CYTOLOGY - PAP: Diagnosis: NEGATIVE

## 2020-10-30 LAB — URINE CULTURE, OB REFLEX

## 2020-10-30 LAB — CULTURE, OB URINE

## 2020-11-02 ENCOUNTER — Other Ambulatory Visit: Payer: Self-pay | Admitting: Advanced Practice Midwife

## 2020-11-02 DIAGNOSIS — R8271 Bacteriuria: Secondary | ICD-10-CM

## 2020-11-02 DIAGNOSIS — B3731 Acute candidiasis of vulva and vagina: Secondary | ICD-10-CM

## 2020-11-02 DIAGNOSIS — B373 Candidiasis of vulva and vagina: Secondary | ICD-10-CM

## 2020-11-02 MED ORDER — TERCONAZOLE 0.4 % VA CREA: 1.0000 | TOPICAL_CREAM | Freq: Every day | VAGINAL | 0 refills | Status: DC

## 2020-11-02 MED ORDER — NITROFURANTOIN MONOHYD MACRO 100 MG PO CAPS: 100.0000 mg | ORAL_CAPSULE | Freq: Two times a day (BID) | ORAL | 1 refills | Status: DC

## 2020-11-02 NOTE — Progress Notes (Signed)
Rx for asymptomatic bacteruria in pregnancy and vaginal candidiasis sent to pharmacy. Message sent to pt via MyChart.

## 2020-11-03 ENCOUNTER — Encounter: Payer: Self-pay | Admitting: Advanced Practice Midwife

## 2020-11-04 LAB — AFP, SERUM, OPEN SPINA BIFIDA
AFP MoM: 1.34
AFP Value: 47.5 ng/mL
Gest. Age on Collection Date: 15 weeks
Maternal Age At EDD: 23.6 yr
OSBR Risk 1 IN: 4273
Test Results:: NEGATIVE
Weight: 115 [lb_av]

## 2020-11-04 LAB — CBC/D/PLT+RPR+RH+ABO+RUB AB...
Antibody Screen: NEGATIVE
Basophils Absolute: 0 10*3/uL (ref 0.0–0.2)
Basos: 0 %
EOS (ABSOLUTE): 0 10*3/uL (ref 0.0–0.4)
Eos: 0 %
HCV Ab: <0.1 s/co ratio (ref 0.0–0.9)
HIV Screen 4th Generation wRfx: NONREACTIVE
Hematocrit: 37.7 % (ref 34.0–46.6)
Hemoglobin: 12.8 g/dL (ref 11.1–15.9)
Hepatitis B Surface Ag: NEGATIVE
Immature Grans (Abs): 0 10*3/uL (ref 0.0–0.1)
Immature Granulocytes: 1 %
Lymphocytes Absolute: 1.8 10*3/uL (ref 0.7–3.1)
Lymphs: 31 %
MCH: 27.9 pg (ref 26.6–33.0)
MCHC: 34 g/dL (ref 31.5–35.7)
MCV: 82 fL (ref 79–97)
Monocytes Absolute: 0.3 10*3/uL (ref 0.1–0.9)
Monocytes: 6 %
Neutrophils Absolute: 3.6 10*3/uL (ref 1.4–7.0)
Neutrophils: 62 %
Platelets: 279 10*3/uL (ref 150–450)
RBC: 4.58 x10E6/uL (ref 3.77–5.28)
RDW: 14.7 % (ref 11.7–15.4)
RPR Ser Ql: NONREACTIVE
Rh Factor: POSITIVE
Rubella Antibodies, IGG: 1.91 index (ref >0.99)
WBC: 5.8 10*3/uL (ref 3.4–10.8)

## 2020-11-04 LAB — HCV INTERPRETATION

## 2020-11-12 ENCOUNTER — Ambulatory Visit: Payer: Medicaid Other

## 2020-11-12 ENCOUNTER — Ambulatory Visit: Payer: Medicaid Other | Attending: Obstetrics and Gynecology

## 2020-11-12 ENCOUNTER — Other Ambulatory Visit: Payer: Self-pay

## 2020-11-12 DIAGNOSIS — U071 COVID-19: Secondary | ICD-10-CM | POA: Insufficient documentation

## 2020-11-12 DIAGNOSIS — Z3490 Encounter for supervision of normal pregnancy, unspecified, unspecified trimester: Secondary | ICD-10-CM | POA: Insufficient documentation

## 2020-11-12 DIAGNOSIS — O98511 Other viral diseases complicating pregnancy, first trimester: Secondary | ICD-10-CM | POA: Diagnosis not present

## 2020-11-12 DIAGNOSIS — Z3A18 18 weeks gestation of pregnancy: Secondary | ICD-10-CM | POA: Insufficient documentation

## 2020-11-23 ENCOUNTER — Ambulatory Visit (INDEPENDENT_AMBULATORY_CARE_PROVIDER_SITE_OTHER): Payer: Medicaid Other | Admitting: Advanced Practice Midwife

## 2020-11-23 ENCOUNTER — Other Ambulatory Visit: Payer: Self-pay

## 2020-11-23 VITALS — BP 101/66 | HR 60 | Wt 122.0 lb

## 2020-11-23 DIAGNOSIS — R102 Pelvic and perineal pain: Secondary | ICD-10-CM

## 2020-11-23 DIAGNOSIS — B372 Candidiasis of skin and nail: Secondary | ICD-10-CM | POA: Insufficient documentation

## 2020-11-23 DIAGNOSIS — Z3A19 19 weeks gestation of pregnancy: Secondary | ICD-10-CM

## 2020-11-23 DIAGNOSIS — Z3490 Encounter for supervision of normal pregnancy, unspecified, unspecified trimester: Secondary | ICD-10-CM

## 2020-11-23 DIAGNOSIS — O26892 Other specified pregnancy related conditions, second trimester: Secondary | ICD-10-CM | POA: Insufficient documentation

## 2020-11-23 MED ORDER — CLOTRIMAZOLE-BETAMETHASONE 1-0.05 % EX CREA: 1.0000 "application " | TOPICAL_CREAM | Freq: Two times a day (BID) | CUTANEOUS | 0 refills | Status: DC

## 2020-11-23 NOTE — Progress Notes (Signed)
   PRENATAL VISIT NOTE  Subjective:  Danielle Carter is a 25 y.o. [id number] at [redacted]w[redacted]d being seen today for ongoing prenatal care.  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  Patient reports pelvic pain with walking, worse than with last pregnancy and rash on torso/breasts/groin.  Contractions: Not present. Vag. Bleeding: None.  Movement: Present. Denies leaking of fluid.   The following portions of the patient's history were reviewed and updated as appropriate: allergies, current medications, past family history, past medical history, past social history, past surgical history and problem list.   Objective:   Vitals:   11/23/20 1508  BP: 101/66  Pulse: 60  Weight: 122 lb (55.3 kg)    Fetal Status: Fetal Heart Rate (bpm): 150   Movement: Present     General:  Alert, oriented and cooperative. Patient is in no acute distress.  Skin: Skin is warm and dry. No rash noted.   Cardiovascular: Normal heart rate noted  Respiratory: Normal respiratory effort, no problems with respiration noted  Abdomen: Soft, gravid, appropriate for gestational age.  Pain/Pressure: Present     Pelvic: Cervical exam deferred        Extremities: Normal range of motion.     Mental Status: Normal mood and affect. Normal behavior. Normal judgment and thought content.   Assessment and Plan:  Pregnancy: G3P1102 at [redacted]w[redacted]d 1. Encounter for supervision of normal pregnancy, antepartum, unspecified gravidity --Anticipatory guidance about next visits/weeks of pregnancy given. --Next visit in 4 weeks in office  2. [redacted] weeks gestation of pregnancy   3.  Pelvic pain affecting pregnancy in second trimester, antepartum --Pain at pelvic crest/pubic symphysis/low back c/w musculoskeletal pain related to relaxin. --Pt to try PT as early in pregnancy as possible  - Ambulatory referral to Physical Therapy  4. Candidal skin infection --Pt with rash on upper abdomen/breasts/groin area with small flat lesions with flaking on surface, satellite lesions, most c/w candidal infection. Pt reports rash started before pregnancy and resolved after 1 week of topical antifungal, but returned recently during the pregnancy.  There is mild itching/irritation.  She denies any changes to soaps/detergents.  --Candida vs contact dermatitis. Pt reports it did NOT improve with hydrocortisone but improved with cream prescribed earlier.  Rx renewed for Lotrisone (clotrimazole-betamethasone) cream.   --Recommend unscented soaps/laundry detergents --If persists, need to follow up with dermatology   Preterm labor symptoms and general obstetric precautions including but not limited to vaginal bleeding, contractions, leaking of fluid and fetal movement were reviewed in detail with the patient. Please refer to After Visit Summary for other counseling recommendations.   Return in about 4 weeks (around 12/21/2020).  No future appointments.  Sharen Counter, CNM

## 2020-11-23 NOTE — Patient Instructions (Signed)

## 2020-12-14 DIAGNOSIS — M791 Myalgia, unspecified site: Secondary | ICD-10-CM | POA: Diagnosis not present

## 2020-12-14 DIAGNOSIS — R059 Cough, unspecified: Secondary | ICD-10-CM | POA: Diagnosis not present

## 2020-12-14 DIAGNOSIS — Z20822 Contact with and (suspected) exposure to covid-19: Secondary | ICD-10-CM | POA: Diagnosis not present

## 2020-12-14 DIAGNOSIS — R0981 Nasal congestion: Secondary | ICD-10-CM | POA: Diagnosis not present

## 2020-12-14 DIAGNOSIS — R5383 Other fatigue: Secondary | ICD-10-CM | POA: Diagnosis not present

## 2020-12-14 DIAGNOSIS — R0982 Postnasal drip: Secondary | ICD-10-CM | POA: Diagnosis not present

## 2020-12-14 DIAGNOSIS — B349 Viral infection, unspecified: Secondary | ICD-10-CM | POA: Diagnosis not present

## 2020-12-22 ENCOUNTER — Telehealth (INDEPENDENT_AMBULATORY_CARE_PROVIDER_SITE_OTHER): Payer: Medicaid Other | Admitting: Obstetrics and Gynecology

## 2020-12-22 DIAGNOSIS — Z3490 Encounter for supervision of normal pregnancy, unspecified, unspecified trimester: Secondary | ICD-10-CM | POA: Diagnosis not present

## 2020-12-22 NOTE — Progress Notes (Signed)
    TELEHEALTH OBSTETRICS VISIT ENCOUNTER NOTE  Provider location: [facility]   Patient location: Home  I connected with Danielle Carter on 12/22/20 at  2:40 PM EDT by telephone at home and verified that I am speaking with the correct person using two identifiers. Of note, unable to do video encounter due to technical difficulties.    I discussed the limitations, risks, security and privacy concerns of performing an evaluation and management service by telephone and the availability of in person appointments. I also discussed with the patient that there may be a patient responsible charge related to this service. The patient expressed understanding and agreed to proceed.  Subjective:  Danielle Carter is a 25 y.o. [id number] at [redacted]w[redacted]d being followed for ongoing prenatal care.  She is currently monitored for the following issues for this low-risk pregnancy and has Low grade squamous intraepith lesion on cytologic smear cervix (lgsil); Gastric ulcer; Major depression; PTSD (post-traumatic stress disorder); History of preterm delivery; Supervision of normal pregnancy, antepartum; Candidal skin infection; and Pelvic pain affecting pregnancy in second trimester, antepartum on their problem list.  Patient reports no complaints. Reports fetal movement. Denies any contractions, bleeding or leaking of fluid.   The following portions of the patient's history were reviewed and updated as appropriate: allergies, current medications, past family history, past medical history, past social history, past surgical history and problem list.   Objective:  Blood pressure (!) 101/48, pulse 82, weight 130 lb (59 kg), last menstrual period 07/09/2020, unknown if currently breastfeeding. General:  Alert, oriented and cooperative.   Mental Status: Normal mood and affect perceived. Normal judgment and thought content.  Rest of physical exam deferred due to type of encounter  Assessment and Plan:   Pregnancy: G3P1102 at [redacted]w[redacted]d 1. Encounter for supervision of normal pregnancy, antepartum, unspecified gravidity  Normal OB US. Chronic pelvic pain, starting pelvic PT next month.  2 hour GTT next visit.   Preterm labor symptoms and general obstetric precautions including but not limited to vaginal bleeding, contractions, leaking of fluid and fetal movement were reviewed in detail with the patient.  I discussed the assessment and treatment plan with the patient. The patient was provided an opportunity to ask questions and all were answered. The patient agreed with the plan and demonstrated an understanding of the instructions. The patient was advised to call back or seek an in-person office evaluation/go to [facility] at [facility] for any urgent or concerning symptoms. Please refer to After Visit Summary for other counseling recommendations.   I provided 10 minutes of non-face-to-face time during this encounter.  Return in about 4 weeks (around 01/19/2021), or 2 hour GTT at next visit. Fasting after MN.  Future Appointments  Date Time Provider Department Center  01/03/2021 11:45 AM Theressa Millard, PT [facility]    Venia Carbon, NP Center for [facility], [facility]

## 2021-01-03 ENCOUNTER — Other Ambulatory Visit: Payer: Self-pay

## 2021-01-03 ENCOUNTER — Telehealth (INDEPENDENT_AMBULATORY_CARE_PROVIDER_SITE_OTHER): Payer: Medicaid Other | Admitting: Advanced Practice Midwife

## 2021-01-03 ENCOUNTER — Ambulatory Visit: Payer: Medicaid Other | Attending: Advanced Practice Midwife | Admitting: Physical Therapy

## 2021-01-03 ENCOUNTER — Encounter: Payer: Self-pay | Admitting: Physical Therapy

## 2021-01-03 DIAGNOSIS — M549 Dorsalgia, unspecified: Secondary | ICD-10-CM

## 2021-01-03 DIAGNOSIS — Z3490 Encounter for supervision of normal pregnancy, unspecified, unspecified trimester: Secondary | ICD-10-CM

## 2021-01-03 DIAGNOSIS — M545 Low back pain, unspecified: Secondary | ICD-10-CM | POA: Insufficient documentation

## 2021-01-03 DIAGNOSIS — O99891 Other specified diseases and conditions complicating pregnancy: Secondary | ICD-10-CM

## 2021-01-03 DIAGNOSIS — R102 Pelvic and perineal pain: Secondary | ICD-10-CM | POA: Insufficient documentation

## 2021-01-03 DIAGNOSIS — M6281 Muscle weakness (generalized): Secondary | ICD-10-CM | POA: Insufficient documentation

## 2021-01-03 DIAGNOSIS — Z3A25 25 weeks gestation of pregnancy: Secondary | ICD-10-CM

## 2021-01-03 DIAGNOSIS — O26892 Other specified pregnancy related conditions, second trimester: Secondary | ICD-10-CM

## 2021-01-03 MED ORDER — CYCLOBENZAPRINE HCL 5 MG PO TABS: 5.0000 mg | ORAL_TABLET | Freq: Three times a day (TID) | ORAL | 0 refills | Status: DC | PRN

## 2021-01-03 NOTE — Patient Instructions (Signed)
Access Code: [email or web address] URL: [email or web address] Date: 01/03/2021 Prepared by: Eulis Foster  Exercises Quadruped Cat Camel - 1 x daily - 7 x weekly - 1 sets - 10 reps Child's Pose Stretch - 1 x daily - 7 x weekly - 1 sets - 1 reps - 15 sec hold Seated Pelvic Tilt - 2 x daily - 7 x weekly - 1 sets - 10 reps [facility] [address], [address] [place], [place] [address] Phone # [phone] Fax [phone]

## 2021-01-03 NOTE — Progress Notes (Signed)
+   Fetal movement. No complaint.

## 2021-01-03 NOTE — Progress Notes (Signed)
OBSTETRICS PRENATAL VIRTUAL VISIT ENCOUNTER NOTE  Provider location: [facility]   Patient location: Home  I connected with Danielle Carter on 01/03/21 at  4:00 PM EDT by MyChart Video Encounter and verified that I am speaking with the correct person using two identifiers.   I discussed the limitations, risks, security and privacy concerns of performing an evaluation and management service virtually and the availability of in person appointments. I also discussed with the patient that there may be a patient responsible charge related to this service. The patient expressed understanding and agreed to proceed. Subjective:  Danielle Carter is a 25 y.o. [id number] at 102w3d being seen today for ongoing prenatal care.  She is currently monitored for the following issues for this low-risk pregnancy and has Low grade squamous intraepith lesion on cytologic smear cervix (lgsil); Gastric ulcer; Major depression; PTSD (post-traumatic stress disorder); History of preterm delivery; Supervision of normal pregnancy, antepartum; Candidal skin infection; and Pelvic pain affecting pregnancy in second trimester, antepartum on their problem list.  Patient reports severe back and pelvic pain.  Contractions: Not present. Vag. Bleeding: None.  Movement: Present. Denies any leaking of fluid.   The following portions of the patient's history were reviewed and updated as appropriate: allergies, current medications, past family history, past medical history, past social history, past surgical history and problem list.   Objective:  There were no vitals filed for this visit.  Fetal Status:     Movement: Present     General:  Alert, oriented and cooperative. Patient is in no acute distress.  Respiratory: Normal respiratory effort, no problems with respiration noted  Mental Status: Normal mood and affect. Normal behavior. Normal judgment and thought content.  Rest of physical exam deferred due  to type of encounter  Imaging: No results found.  Assessment and Plan:  Pregnancy: G3P1102 at [redacted]w[redacted]d 1. Encounter for supervision of normal pregnancy, antepartum, unspecified gravidity --Anticipatory guidance about next visits/weeks of pregnancy given. --Next visit in 2 weeks in office for GTT  2. Back pain affecting pregnancy in third trimester --Pt with severe back and pelvic pain, acute on chronic pain worse in last week --No intermittent cramping, pain is with constant, worse with movement/certain positions.   --She is unable to stand up all the way, and cannot perform daily tasks without help --She saw PT this morning and has a plan for sessions twice per week x 3 weeks --Note provided for pt to miss work x 3 weeks, follow up at next prenatal visit 01/20/21. --Rest/ice/heat/warm bath/Tylenol --Rx for Flexeril 5-10 mg TID PRN  3. Pelvic pain affecting pregnancy in second trimester, antepartum --See back pain above  Preterm labor symptoms and general obstetric precautions including but not limited to vaginal bleeding, contractions, leaking of fluid and fetal movement were reviewed in detail with the patient. I discussed the assessment and treatment plan with the patient. The patient was provided an opportunity to ask questions and all were answered. The patient agreed with the plan and demonstrated an understanding of the instructions. The patient was advised to call back or seek an in-person office evaluation/go to MAU at [facility] for any urgent or concerning symptoms. Please refer to After Visit Summary for other counseling recommendations.   I provided 10 minutes of face-to-face time during this encounter.  No follow-ups on file.  Future Appointments  Date Time Provider Department Center  01/03/2021  4:00 PM Leftwich-Kirby, Wilmer Floor, CNM [facility] None  01/04/2021  3:00 PM Theressa Millard, PT [facility]  01/11/2021  3:00 PM Theressa Millard, PT [facility]  01/13/2021   8:45 AM Theressa Millard, PT [facility]  01/20/2021  9:00 AM [facility] LAB [facility] None  01/20/2021 10:15 AM Warden Fillers, MD [facility] None  01/20/2021 12:30 PM Theressa Millard, PT [facility]  01/31/2021  9:30 AM Theressa Millard, PT [facility]  02/14/2021 10:15 AM Theressa Millard, PT [facility]  02/21/2021 11:00 AM Theressa Millard, PT [facility]  03/03/2021 10:15 AM Theressa Millard, PT [facility]  03/10/2021 11:00 AM Theressa Millard, PT [facility]    Sharen Counter, CNM [facility]

## 2021-01-03 NOTE — Therapy (Signed)
[facility] [address], [address] [place], [place], [address] Phone: [phone]   Fax:  [phone]  Physical Therapy Evaluation  Patient Details  Name: Danielle Carter MRN: [id number] Date of Birth: 24-Dec-1995 Referring Provider (PT): Arbie Cookey   Encounter Date: 01/03/2021   PT End of Session - 01/03/21 1257    Visit Number 1    Date for PT Re-Evaluation 03/28/21    Authorization Type [employer]    PT Start Time 1145    PT Stop Time 1230    PT Time Calculation (min) 45 min    Activity Tolerance Patient tolerated treatment well;Patient limited by pain    Behavior During Therapy [facility] for tasks assessed/performed           Past Medical History:  Diagnosis Date  . Anemia   . Anxiety   . Depression   . PTSD (post-traumatic stress disorder)     Past Surgical History:  Procedure Laterality Date  . DRUG INDUCED ENDOSCOPY      There were no vitals filed for this visit.    Subjective Assessment - 01/03/21 1154    Subjective Patient is [redacted] weeks along. She has popping in her hips. Patient feels her back is swollen. No stretch helps the swelling. Yesterday lost use of left leg due to pain. Patient reports she is not able to go to work last week due to the pain. Due date 04/15/2021. Not able to pick up her son.    Patient Stated Goals reduce pain and function    Currently in Pain? Yes    Pain Score 9     Pain Location Back    Pain Orientation Lower    Pain Descriptors / Indicators Shooting;Throbbing;Constant    Pain Type Acute pain    Pain Onset More than a month ago    Pain Frequency Constant    Aggravating Factors  move in any direction, lifting son 73 month old;    Pain Relieving Factors staying still,              [facility] PT Assessment - 01/03/21 0001      Assessment   Medical Diagnosis O26.892, R10.2 Pelvic pain affecting pregnancy in second trimester, antepartum    Referring Provider (PT) Misty Stanley  Leftwhich-Kirby    Onset Date/Surgical Date --   11/2020   Prior Therapy none      Precautions   Precautions Other (comment)    Precaution Comments presently pregnant      Restrictions   Weight Bearing Restrictions No      Balance Screen   Has the patient fallen in the past 6 months No    Has the patient had a decrease in activity level because of a fear of falling?  No    Is the patient reluctant to leave their home because of a fear of falling?  No      Home Tourist information centre manager residence      Prior Function   Level of Independence Independent    Vocation Full time employment    Vocation Requirements sitting, works in call center      Cognition   Overall Cognitive Status Within Functional Limits for tasks assessed      Posture/Postural Control   Posture/Postural Control Postural limitations    Posture Comments pregnant posture, stand with hip at -20 degress lacking extension      ROM / Strength   AROM / PROM /  Strength AROM;PROM;Strength      PROM   Overall PROM Comments lumbar ROM is limited by 25% when in sitting, not able to check in standing due to pain      Strength   Overall Strength Comments bilateral hip strength is 4/5      Palpation   Spinal mobility decreased movement of L1-L5; sacrum rotated right; decreased spring test on the right SI joint    SI assessment  left ilium rotated posteriorly    Palpation comment tenderness in the left psoas, bil. hip adductors, bil. quadratus, bul. gluteal, bil. levator ani                      Objective measurements completed on examination: See above findings.     Pelvic Floor Special Questions - 01/03/21 0001    Prior Pregnancies Yes    Number of Vaginal Deliveries 2    Episiotomy Performed No    Currently Sexually Active Yes    Is this Painful No    Urinary Leakage No    Fecal incontinence No   slight constpation           [facility] Adult PT Treatment/Exercise - 01/03/21 0001       Therapeutic Activites    Therapeutic Activities Other Therapeutic Activities    Other Therapeutic Activities how to go from sit to stand with erect posture and flexing at the hips; rolling in bed with knees together      Manual Therapy   Manual Therapy Joint mobilization;Soft tissue mobilization;Muscle Energy Technique    Joint Mobilization PA and rotational mobilization to T8-L5; springing of sacrum    Soft tissue mobilization bil. quadratus, posas, hip adductors and gluteal    Muscle Energy Technique muscle energy technique of left ilium                  PT Education - 01/03/21 1256    Education Details Access Code: [email or web address]    Person(s) Educated Patient    Methods Explanation;Demonstration;Verbal cues    Comprehension Returned demonstration;Verbalized understanding            PT Short Term Goals - 01/03/21 1305      PT SHORT TERM GOAL #1   Title independent with initial HEP for pain relief and stretches    Baseline not educated yet    Time 4    Period Weeks    Status New    Target Date 01/31/21             PT Long Term Goals - 01/03/21 1306      PT LONG TERM GOAL #1   Title independent with advanced HEP    Baseline not educated yet    Time 12    Period Weeks    Status New    Target Date 03/28/21      PT LONG TERM GOAL #2   Title able to move in bed and get in and out with pain level </= 4/5 using correct body mechanics    Time 12    Period Weeks    Status New    Target Date 03/28/21      PT LONG TERM GOAL #3   Title able to stand erect with hip extension at 0 degrees instead of -20 degrees due to increased spinal mobility    Baseline stands with 20 degress of hip flexion    Time 12    Period Weeks    Status New  Target Date 03/28/21      PT LONG TERM GOAL #4   Title understand ways to give birth to reduce the strain on the lumbar and pelvic region    Baseline not educated yet    Time 12    Period Weeks    Status New    Target Date  03/28/21                  Plan - 01/03/21 1258    Clinical Impression Statement Patient is a 25 year old female with pelvic pain that is affecting her pregnancy for the pas 19 weeks. Patients pain has come on suddenly. Lumbar ROM is limited by 25% when in sitting. Standing she is not able to get into spinal neutral and is flexed by 20 degrees at the hips. Left ilium is posteriorly rotated and higher than the right. Sacrum rotated right and decreased springing of the sacrum. Decreased movement of L1-L5. Patient has tenderness located on the hip adductors, gluteal, quadratus, levator ani, pubic symphysis. and psoas. Bilateral hip strength averages 4/5. Patient has difficulty with moving in bed. Patient will benefit from skilled therapy to improve mobility and reduce pain as her pregnancy progresses.    Personal Factors and Comorbidities Fitness;Past/Current Experience;Profession;Comorbidity 1    Examination-Activity Limitations Bed Mobility;Bend;Lift;Stand;Squat;Stairs;Sit;Caring for Others;Carry;Locomotion Level;Transfers    Examination-Participation Restrictions Meal Prep;Cleaning;Occupation;Driving;Shop;Laundry    Stability/Clinical Decision Making Evolving/Moderate complexity    Clinical Decision Making Moderate    Rehab Potential Good    PT Frequency 2x / week    PT Duration 12 weeks    PT Treatment/Interventions ADLs/Self Care Home Management;Cryotherapy;Moist Heat;Neuromuscular re-education;Therapeutic exercise;Therapeutic activities;Functional mobility training;Patient/family education;Manual techniques;Passive range of motion;Joint Manipulations;Spinal Manipulations;Taping    PT Next Visit Plan correct pelvis; spinal mobility, body mechanics with daily activities, manual work on the hip adductors, gluteal, quadratus, work on spinal extension, SI stability    PT Home Exercise Plan Access Code: [email or web address]    Consulted and Agree with Plan of Care Patient           Patient will  benefit from skilled therapeutic intervention in order to improve the following deficits and impairments:  Decreased coordination,Decreased range of motion,Increased fascial restricitons,Decreased endurance,Increased muscle spasms,Decreased activity tolerance,Pain,Improper body mechanics,Decreased strength,Decreased mobility  Visit Diagnosis: Muscle weakness (generalized) - Plan: PT plan of care cert/re-cert  Acute bilateral low back pain without sciatica - Plan: PT plan of care cert/re-cert  Pelvic pain - Plan: PT plan of care cert/re-cert     Problem List Patient Active Problem List   Diagnosis Date Noted  . Candidal skin infection 11/23/2020  . Pelvic pain affecting pregnancy in second trimester, antepartum 11/23/2020  . Supervision of normal pregnancy, antepartum 09/29/2020  . History of preterm delivery 09/15/2019  . Low grade squamous intraepith lesion on cytologic smear cervix (lgsil) 02/26/2019  . Gastric ulcer 10/15/2013  . Major depression 10/15/2013  . PTSD (post-traumatic stress disorder) 10/15/2013    Eulis Foster, PT 01/03/21 1:14 PM   [facility] [address], [address] [place], [place], [address] Phone: [phone]   Fax:  [phone]  Name: SHANTANA CHRISTON MRN: [id number] Date of Birth: [birth year]

## 2021-01-04 ENCOUNTER — Inpatient Hospital Stay (HOSPITAL_COMMUNITY)
Admission: AD | Admit: 2021-01-04 | Discharge: 2021-01-07 | DRG: 833 | Disposition: A | Discharge status: Home / Self Care | Payer: Medicaid Other | Attending: Obstetrics and Gynecology | Admitting: Obstetrics and Gynecology

## 2021-01-04 ENCOUNTER — Other Ambulatory Visit: Payer: Self-pay

## 2021-01-04 ENCOUNTER — Encounter (HOSPITAL_COMMUNITY): Payer: Self-pay | Admitting: Obstetrics and Gynecology

## 2021-01-04 ENCOUNTER — Encounter: Payer: Medicaid Other | Admitting: Physical Therapy

## 2021-01-04 DIAGNOSIS — O2302 Infections of kidney in pregnancy, second trimester: Secondary | ICD-10-CM | POA: Diagnosis not present

## 2021-01-04 DIAGNOSIS — Z3A26 26 weeks gestation of pregnancy: Secondary | ICD-10-CM | POA: Diagnosis not present

## 2021-01-04 DIAGNOSIS — F17210 Nicotine dependence, cigarettes, uncomplicated: Secondary | ICD-10-CM | POA: Diagnosis not present

## 2021-01-04 DIAGNOSIS — Z20822 Contact with and (suspected) exposure to covid-19: Secondary | ICD-10-CM | POA: Diagnosis not present

## 2021-01-04 DIAGNOSIS — O99332 Smoking (tobacco) complicating pregnancy, second trimester: Secondary | ICD-10-CM | POA: Diagnosis not present

## 2021-01-04 DIAGNOSIS — Z3490 Encounter for supervision of normal pregnancy, unspecified, unspecified trimester: Secondary | ICD-10-CM

## 2021-01-04 DIAGNOSIS — Z3A25 25 weeks gestation of pregnancy: Secondary | ICD-10-CM | POA: Diagnosis not present

## 2021-01-04 DIAGNOSIS — O99891 Other specified diseases and conditions complicating pregnancy: Secondary | ICD-10-CM | POA: Diagnosis not present

## 2021-01-04 DIAGNOSIS — O2300 Infections of kidney in pregnancy, unspecified trimester: Secondary | ICD-10-CM | POA: Diagnosis present

## 2021-01-04 DIAGNOSIS — B9620 Unspecified Escherichia coli [E. coli] as the cause of diseases classified elsewhere: Secondary | ICD-10-CM | POA: Diagnosis present

## 2021-01-04 DIAGNOSIS — K5900 Constipation, unspecified: Secondary | ICD-10-CM | POA: Diagnosis present

## 2021-01-04 DIAGNOSIS — R509 Fever, unspecified: Secondary | ICD-10-CM | POA: Diagnosis not present

## 2021-01-04 DIAGNOSIS — N12 Tubulo-interstitial nephritis, not specified as acute or chronic: Secondary | ICD-10-CM | POA: Diagnosis not present

## 2021-01-04 HISTORY — DX: Unspecified abnormal cytological findings in specimens from vagina: R87.629

## 2021-01-04 HISTORY — DX: Headache, unspecified: R51.9

## 2021-01-04 HISTORY — DX: Unspecified infectious disease: B99.9

## 2021-01-04 LAB — CBC WITH DIFFERENTIAL/PLATELET
Abs Immature Granulocytes: 0.09 10*3/uL — ABNORMAL HIGH (ref 0.00–0.07)
Basophils Absolute: 0 10*3/uL (ref 0.0–0.1)
Basophils Relative: 0 %
Eosinophils Absolute: 0 10*3/uL (ref 0.0–0.5)
Eosinophils Relative: 0 %
HCT: 35 % — ABNORMAL LOW (ref 36.0–46.0)
Hemoglobin: 11.7 g/dL — ABNORMAL LOW (ref 12.0–15.0)
Immature Granulocytes: 1 %
Lymphocytes Relative: 6 %
Lymphs Abs: 0.7 10*3/uL (ref 0.7–4.0)
MCH: 29.7 pg (ref 26.0–34.0)
MCHC: 33.4 g/dL (ref 30.0–36.0)
MCV: 88.8 fL (ref 80.0–100.0)
Monocytes Absolute: 1.2 10*3/uL — ABNORMAL HIGH (ref 0.1–1.0)
Monocytes Relative: 9 %
Neutro Abs: 10.6 10*3/uL — ABNORMAL HIGH (ref 1.7–7.7)
Neutrophils Relative %: 84 %
Platelets: 183 10*3/uL (ref 150–400)
RBC: 3.94 MIL/uL (ref 3.87–5.11)
RDW: 13.7 % (ref 11.5–15.5)
WBC: 12.6 10*3/uL — ABNORMAL HIGH (ref 4.0–10.5)
nRBC: 0 % (ref 0.0–0.2)

## 2021-01-04 LAB — URINALYSIS, ROUTINE W REFLEX MICROSCOPIC
Bilirubin Urine: NEGATIVE
Glucose, UA: NEGATIVE mg/dL
Ketones, ur: NEGATIVE mg/dL
Nitrite: NEGATIVE
Protein, ur: 100 mg/dL — AB
Specific Gravity, Urine: 1.02 (ref 1.005–1.030)
WBC, UA: >50 WBC/hpf — ABNORMAL HIGH (ref 0–5)
pH: 5 (ref 5.0–8.0)

## 2021-01-04 LAB — TYPE AND SCREEN
ABO/RH(D): O POS
Antibody Screen: NEGATIVE

## 2021-01-04 LAB — RESP PANEL BY RT-PCR (FLU A&B, COVID) ARPGX2
Influenza A by PCR: NEGATIVE
Influenza B by PCR: NEGATIVE
SARS Coronavirus 2 by RT PCR: NEGATIVE

## 2021-01-04 MED ORDER — LACTATED RINGERS IV BOLUS
1000.0000 mL | Freq: Once | INTRAVENOUS | Status: AC
  Administered 2021-01-04: 1000 mL via INTRAVENOUS

## 2021-01-04 MED ORDER — LACTATED RINGERS IV SOLN
INTRAVENOUS | Status: DC
  Administered 2021-01-04: 100 mL/h via INTRAVENOUS

## 2021-01-04 MED ORDER — ZOLPIDEM TARTRATE 5 MG PO TABS
5.0000 mg | ORAL_TABLET | Freq: Once | ORAL | Status: AC
  Administered 2021-01-04: 5 mg via ORAL
  Filled 2021-01-04: qty 1

## 2021-01-04 MED ORDER — DOCUSATE SODIUM 100 MG PO CAPS: 100.0000 mg | ORAL_CAPSULE | Freq: Every day | ORAL | Status: DC

## 2021-01-04 MED ORDER — SODIUM CHLORIDE 0.9 % IV SOLN
3.0000 g | Freq: Four times a day (QID) | INTRAVENOUS | Status: DC
  Administered 2021-01-04 – 2021-01-07 (×11): 3 g via INTRAVENOUS
  Filled 2021-01-04: qty 8
  Filled 2021-01-04 (×2): qty 3
  Filled 2021-01-04 (×7): qty 8
  Filled 2021-01-04: qty 3
  Filled 2021-01-04: qty 8

## 2021-01-04 MED ORDER — CYCLOBENZAPRINE HCL 10 MG PO TABS
5.0000 mg | ORAL_TABLET | Freq: Three times a day (TID) | ORAL | Status: DC | PRN
  Administered 2021-01-05: 5 mg via ORAL
  Administered 2021-01-06: 10 mg via ORAL
  Administered 2021-01-06 – 2021-01-07 (×2): 5 mg via ORAL
  Filled 2021-01-04 (×4): qty 1

## 2021-01-04 MED ORDER — CALCIUM CARBONATE ANTACID 500 MG PO CHEW
2.0000 | CHEWABLE_TABLET | ORAL | Status: DC | PRN
  Administered 2021-01-05 – 2021-01-06 (×3): 400 mg via ORAL
  Filled 2021-01-04 (×3): qty 2

## 2021-01-04 MED ORDER — SODIUM CHLORIDE 0.9 % IV SOLN: 1.0000 g | Freq: Three times a day (TID) | INTRAVENOUS | Status: DC

## 2021-01-04 MED ORDER — PRENATAL MULTIVITAMIN CH
1.0000 | ORAL_TABLET | Freq: Every day | ORAL | Status: DC
  Administered 2021-01-05 – 2021-01-06 (×2): 1 via ORAL
  Filled 2021-01-04 (×2): qty 1

## 2021-01-04 MED ORDER — ACETAMINOPHEN 325 MG PO TABS
650.0000 mg | ORAL_TABLET | ORAL | Status: DC | PRN
  Administered 2021-01-04 – 2021-01-07 (×10): 650 mg via ORAL
  Filled 2021-01-04 (×11): qty 2

## 2021-01-04 NOTE — MAU Note (Signed)
Danielle Carter is a 25 y.o. at [redacted]w[redacted]d here in MAU reporting: started with a fever today and it was 102.5. thinks she has a UTI, having frequent urination, burning, and lower abdominal pain. No bleeding or LOF. +FM. Pt reports she took tylenol around 1600.  Onset of complaint: today  Pain score: 6/10  Vitals:   01/04/21 1817  BP: (!) 106/59  Pulse: (!) 112  Resp: 16  Temp: 98.3 F (36.8 C)  SpO2: 98%     FHT:173  Lab orders placed from triage: UA

## 2021-01-04 NOTE — H&P (Signed)
OBSTETRIC ADMISSION HISTORY AND PHYSICAL  Danielle Carter is a 25 y.o. female [phone] with IUP at 87w4dpresenting with fever, urinary sx, and back pain. Pt reports onset of dysuria and frequency 2-3 days ago. Denies hematuria. Reports fever of 102 around 3pm today. She took 1g of Tylenol around 4pm. She denies respiratory sx. She is drinking well but had no appetite and did not eat today. LBP has been ongoing for the last month but worse today. Reports +FM. Denies VB, LOF, and ctx. Reports "tightening, like the baby is changing position" q2-3 minutes.   Dating: By LMP --->  Estimated Date of Delivery: 04/15/21  Prenatal History/Complications: - hx of PTD _0  & 37 wks - LGSIL  Past Medical History: Past Medical History:  Diagnosis Date  . Anemia   . Anxiety   . Depression    going well  . Headache    'occular'  . Infection    UTI  . PTSD (post-traumatic stress disorder)   . Vaginal Pap smear, abnormal    ok since    Past Surgical History: Past Surgical History:  Procedure Laterality Date  . DRUG INDUCED ENDOSCOPY      Obstetrical History: OB History    Gravida  3   Para  2   Term  1   Preterm  1   AB  0   Living  2     SAB      IAB      Ectopic      Multiple  0   Live Births  2           Social History: Social History   Socioeconomic History  . Marital status: Significant Other    Spouse name: DRickard Rhymes . Number of children: 1  . Years of education: Not on file  . Highest education level: Not on file  Occupational History  . Not on file  Tobacco Use  . Smoking status: Current Every Day Smoker    Packs/day: 0.50    Types: Cigarettes    Last attempt to quit: 12/10/2018    Years since quitting: 2.0  . Smokeless tobacco: Never Used  Vaping Use  . Vaping Use: Never used  Substance and Sexual Activity  . Alcohol use: Never  . Drug use: Never  . Sexual activity: Yes    Birth control/protection: None  Other Topics Concern  . Not on  file  Social History Narrative  . Not on file   Social Determinants of Health   Financial Resource Strain: Not on file  Food Insecurity: Not on file  Transportation Needs: Not on file  Physical Activity: Not on file  Stress: Not on file  Social Connections: Not on file    Family History: Family History  Problem Relation Age of Onset  . Fibromyalgia Mother   . Seizures Mother   . Pancreatitis Father   . Graves' disease Father   . Depression Father     Allergies: Allergies  Allergen Reactions  . Abilify [Aripiprazole]     Massive mood swings  . Nickel Other (See Comments)    Turns green  . Pristiq [Desvenlafaxine]     Massive headaches  . Rocephin [Ceftriaxone Sodium In Dextrose] Hives  . Latex Rash    Medications Prior to Admission  Medication Sig Dispense Refill Last Dose  . clotrimazole-betamethasone (LOTRISONE) cream Apply 1 application topically 2 (two) times daily. 30 g 0 Past Week at Unknown time  . Prenatal Vit-Fe  Fumarate-FA (MULTIVITAMIN-PRENATAL) 27-0.8 MG TABS tablet Take 1 tablet by mouth daily at 12 noon.   01/03/2021 at Unknown time  . acetaminophen (TYLENOL) 325 MG tablet Take 2 tablets (650 mg total) by mouth every 4 (four) hours as needed (for pain scale < 4). (Patient taking differently: Take 1,000 mg by mouth every 6 (six) hours as needed (for pain scale < 4).) 30 tablet 0  at 1600  . Blood Pressure Monitor KIT 1 each by Does not apply route daily. 1 each 0   . cyclobenzaprine (FLEXERIL) 5 MG tablet Take 1-2 tablets (5-10 mg total) by mouth 3 (three) times daily as needed for muscle spasms. 30 tablet 0  at 1630  . ibuprofen (ADVIL) 600 MG tablet Take 1 tablet (600 mg total) by mouth every 6 (six) hours as needed. (Patient not taking: No sig reported) 30 tablet 0   . promethazine (PHENERGAN) 25 MG tablet Take 1 tablet (25 mg total) by mouth every 6 (six) hours as needed for nausea or vomiting. (Patient not taking: No sig reported) 30 tablet 1   .  terconazole (TERAZOL 7) 0.4 % vaginal cream Place 1 applicator vaginally at bedtime. (Patient not taking: No sig reported) 45 g 0      Review of Systems:  All systems reviewed and negative except as stated in HPI  PE: Blood pressure (!) 102/51, pulse 90, temperature 98.3 F (36.8 C), temperature source Oral, resp. rate 16, height _0  (1.626 m), weight 58.6 kg, last menstrual period 07/09/2020, SpO2 98 %, unknown if currently breastfeeding. General appearance: alert, cooperative and no distress Lungs: regular rate and effort Heart: regular rate  Abdomen: soft, non-tender; +Rt CVAT Extremities: Homans sign is negative, no sign of DVT EFM: 155 bpm, mod variability, no accels, no decels Toco: UI SVE: closed/thick  Prenatal labs: ABO, Rh: O/Positive/-- (01/25 1556) Antibody: Negative (01/25 1556) Rubella: 1.91 (01/25 1556) RPR: Non Reactive (01/25 1556)  HBsAg: Negative (01/25 1556)  HIV: Non Reactive (01/25 1556)   Prenatal Transfer Tool  Maternal Diabetes: not tested Genetic Screening: Normal Maternal Ultrasounds/Referrals: Normal Fetal Ultrasounds or other Referrals:  None Maternal Substance Abuse:  No Significant Maternal Medications:  None Significant Maternal Lab Results: None  Results for orders placed or performed during the hospital encounter of 01/04/21 (from the past 24 hour(s))  Urinalysis, Routine w reflex microscopic Urine, Clean Catch   Collection Time: 01/04/21  6:52 PM  Result Value Ref Range   Color, Urine AMBER (A) YELLOW   APPearance CLOUDY (A) CLEAR   Specific Gravity, Urine 1.020 1.005 - 1.030   pH 5.0 5.0 - 8.0   Glucose, UA NEGATIVE NEGATIVE mg/dL   Hgb urine dipstick SMALL (A) NEGATIVE   Bilirubin Urine NEGATIVE NEGATIVE   Ketones, ur NEGATIVE NEGATIVE mg/dL   Protein, ur 100 (A) NEGATIVE mg/dL   Nitrite NEGATIVE NEGATIVE   Leukocytes,Ua MODERATE (A) NEGATIVE   RBC / HPF 11-20 0 - 5 RBC/hpf   WBC, UA >50 (H) 0 - 5 WBC/hpf   Bacteria, UA MANY  (A) NONE SEEN   Squamous Epithelial / LPF 6-10 0 - 5   WBC Clumps PRESENT    Mucus PRESENT     Patient Active Problem List   Diagnosis Date Noted  . Candidal skin infection 11/23/2020  . Pelvic pain affecting pregnancy in second trimester, antepartum 11/23/2020  . Supervision of normal pregnancy, antepartum 09/29/2020  . History of preterm delivery 09/15/2019  . Low grade squamous intraepith lesion on cytologic  smear cervix (lgsil) 02/26/2019  . Gastric ulcer 10/15/2013  . Major depression 10/15/2013  . PTSD (post-traumatic stress disorder) 10/15/2013   Assessment: [redacted] weeks gestation Pyelonephritis   Plan: Admit to [facility] IV abx UC Mngt per Dr. Oliva Bustard, CNM  01/04/2021, 7:40 PM

## 2021-01-05 DIAGNOSIS — O2302 Infections of kidney in pregnancy, second trimester: Secondary | ICD-10-CM | POA: Diagnosis not present

## 2021-01-05 DIAGNOSIS — N12 Tubulo-interstitial nephritis, not specified as acute or chronic: Secondary | ICD-10-CM | POA: Diagnosis not present

## 2021-01-05 DIAGNOSIS — Z3A25 25 weeks gestation of pregnancy: Secondary | ICD-10-CM | POA: Diagnosis not present

## 2021-01-05 LAB — CBC
HCT: 30.2 % — ABNORMAL LOW (ref 36.0–46.0)
Hemoglobin: 10.1 g/dL — ABNORMAL LOW (ref 12.0–15.0)
MCH: 29.7 pg (ref 26.0–34.0)
MCHC: 33.4 g/dL (ref 30.0–36.0)
MCV: 88.8 fL (ref 80.0–100.0)
Platelets: 183 10*3/uL (ref 150–400)
RBC: 3.4 MIL/uL — ABNORMAL LOW (ref 3.87–5.11)
RDW: 13.8 % (ref 11.5–15.5)
WBC: 13.5 10*3/uL — ABNORMAL HIGH (ref 4.0–10.5)
nRBC: 0 % (ref 0.0–0.2)

## 2021-01-05 MED ORDER — DOCUSATE SODIUM 100 MG PO CAPS
100.0000 mg | ORAL_CAPSULE | Freq: Two times a day (BID) | ORAL | Status: DC
  Administered 2021-01-05 – 2021-01-06 (×4): 100 mg via ORAL
  Filled 2021-01-05 (×4): qty 1

## 2021-01-05 MED ORDER — LACTATED RINGERS IV BOLUS
500.0000 mL | Freq: Once | INTRAVENOUS | Status: AC
  Administered 2021-01-05: 500 mL via INTRAVENOUS

## 2021-01-05 MED ORDER — POLYETHYLENE GLYCOL 3350 17 G PO PACK
17.0000 g | PACK | Freq: Every day | ORAL | Status: DC
  Administered 2021-01-05 – 2021-01-06 (×2): 17 g via ORAL
  Filled 2021-01-05 (×2): qty 1

## 2021-01-05 MED ORDER — OXYCODONE HCL 5 MG PO TABS
5.0000 mg | ORAL_TABLET | Freq: Four times a day (QID) | ORAL | Status: DC | PRN
Start: 2021-01-05 — End: 2021-01-07
  Administered 2021-01-05 – 2021-01-06 (×3): 5 mg via ORAL
  Filled 2021-01-05 (×3): qty 1

## 2021-01-05 MED ORDER — DIPHENHYDRAMINE HCL 50 MG/ML IJ SOLN: 25.0000 mg | Freq: Every evening | INTRAMUSCULAR | Status: DC | PRN

## 2021-01-05 MED ORDER — DIPHENHYDRAMINE HCL 50 MG/ML IJ SOLN
25.0000 mg | Freq: Three times a day (TID) | INTRAMUSCULAR | Status: DC | PRN
  Administered 2021-01-05 – 2021-01-07 (×3): 25 mg via INTRAVENOUS
  Filled 2021-01-05 (×3): qty 1

## 2021-01-05 NOTE — Progress Notes (Signed)
FACULTY PRACTICE ANTEPARTUM(COMPREHENSIVE) NOTE  Danielle Carter is a 25 y.o. [id number] with Estimated Date of Delivery: 04/15/21   By  best clinical estimate [redacted]w[redacted]d  who is admitted for pyelonephritis.    Fetal presentation is cephalic. Length of Stay:  1  Days  Date of admission:01/04/2021  Subjective: Pt notes continued hot flashes/fevers.  Rates her back pain about a 7/10.  States she will doze off for a few hours or so and then wake up.  Denies chills.  Denies nausea/vomiting.  No CP/SOB.  Voiding freely.  Last BM about 2 days ago- notes some constipation. Patient reports the fetal movement as active. Patient reports uterine contraction  activity as none. Patient reports  vaginal bleeding as none. Patient describes fluid per vagina as None.  Vitals:  Blood pressure (!) 99/57, pulse 89, temperature 99.7 F (37.6 C), temperature source Oral, resp. rate 18, height 5\' 4"  (1.626 m), weight 58.6 kg, last menstrual period 07/09/2020, SpO2 99 %, unknown if currently breastfeeding. Vitals:   01/04/21 2223 01/05/21 0459 01/05/21 0743 01/05/21 0819  BP:  (!) 101/52  (!) 99/57  Pulse:      Resp:  17  18  Temp: 98.5 F (36.9 C) 98.5 F (36.9 C) 99 F (37.2 C) 99.7 F (37.6 C)  TempSrc: Oral Oral Oral Oral  SpO2:  100%  99%  Weight:      Height:       Physical Examination:  General appearance - ill-appearing Chest - normal respiratory effort Heart - normal rate and regular rhythm Abdomen - gravid, soft and non-tender Back: +CVA tenderness mostly right Musculoskeletal - no calf tenderness bilaterally Extremities - no edema Skin - warm and dry, diaphoretic   Fetal Monitoring:  Baseline: 160 bpm, Variability: moderate, Accelerations: +accels, and Decelerations: Absent    reactive  Labs:  Results for orders placed or performed during the hospital encounter of 01/04/21 (from the past 24 hour(s))  Urinalysis, Routine w reflex microscopic Urine, Clean Catch   Collection Time: 01/04/21  6:52  PM  Result Value Ref Range   Color, Urine AMBER (A) YELLOW   APPearance CLOUDY (A) CLEAR   Specific Gravity, Urine 1.020 1.005 - 1.030   pH 5.0 5.0 - 8.0   Glucose, UA NEGATIVE NEGATIVE mg/dL   Hgb urine dipstick SMALL (A) NEGATIVE   Bilirubin Urine NEGATIVE NEGATIVE   Ketones, ur NEGATIVE NEGATIVE mg/dL   Protein, ur 03/06/21 (A) NEGATIVE mg/dL   Nitrite NEGATIVE NEGATIVE   Leukocytes,Ua MODERATE (A) NEGATIVE   RBC / HPF 11-20 0 - 5 RBC/hpf   WBC, UA >50 (H) 0 - 5 WBC/hpf   Bacteria, UA MANY (A) NONE SEEN   Squamous Epithelial / LPF 6-10 0 - 5   WBC Clumps PRESENT    Mucus PRESENT   CBC with Differential/Platelet   Collection Time: 01/04/21  7:32 PM  Result Value Ref Range   WBC 12.6 (H) 4.0 - 10.5 K/uL   RBC 3.94 3.87 - 5.11 MIL/uL   Hemoglobin 11.7 (L) 12.0 - 15.0 g/dL   HCT 03/06/21 (L) 51.7 - 61.6 %   MCV 88.8 80.0 - 100.0 fL   MCH 29.7 26.0 - 34.0 pg   MCHC 33.4 30.0 - 36.0 g/dL   RDW 07.3 71.0 - 62.6 %   Platelets 183 150 - 400 K/uL   nRBC 0.0 0.0 - 0.2 %   Neutrophils Relative % 84 %   Neutro Abs 10.6 (H) 1.7 - 7.7 K/uL   Lymphocytes  Relative 6 %   Lymphs Abs 0.7 0.7 - 4.0 K/uL   Monocytes Relative 9 %   Monocytes Absolute 1.2 (H) 0.1 - 1.0 K/uL   Eosinophils Relative 0 %   Eosinophils Absolute 0.0 0.0 - 0.5 K/uL   Basophils Relative 0 %   Basophils Absolute 0.0 0.0 - 0.1 K/uL   Immature Granulocytes 1 %   Abs Immature Granulocytes 0.09 (H) 0.00 - 0.07 K/uL  Type and screen [facility]   Collection Time: 01/04/21  7:53 PM  Result Value Ref Range   ABO/RH(D) O POS    Antibody Screen NEG    Sample Expiration      01/07/2021,2359 Performed at [facility], [address], [place], [place] [address]   Resp Panel by RT-PCR (Flu A&B, Covid) Nasopharyngeal Swab   Collection Time: 01/04/21  8:01 PM   Specimen: Nasopharyngeal Swab; Nasopharyngeal(NP) swabs in vial transport medium  Result Value Ref Range   SARS Coronavirus 2 by RT PCR NEGATIVE  NEGATIVE   Influenza A by PCR NEGATIVE NEGATIVE   Influenza B by PCR NEGATIVE NEGATIVE  CBC   Collection Time: 01/05/21  4:29 AM  Result Value Ref Range   WBC 13.5 (H) 4.0 - 10.5 K/uL   RBC 3.40 (L) 3.87 - 5.11 MIL/uL   Hemoglobin 10.1 (L) 12.0 - 15.0 g/dL   HCT 55.9 (L) 74.1 - 63.8 %   MCV 88.8 80.0 - 100.0 fL   MCH 29.7 26.0 - 34.0 pg   MCHC 33.4 30.0 - 36.0 g/dL   RDW 45.3 64.6 - 80.3 %   Platelets 183 150 - 400 K/uL   nRBC 0.0 0.0 - 0.2 %   I have reviewed the patient's current medications.  ASSESSMENT: [id number] [redacted]w[redacted]d Estimated Date of Delivery: 04/15/21 admitted for pyelonephritis   PLAN: 1) Pyelonephritis -currently on IV Unasyn -highest temp 99, per pt home temp102 -continue IV LR @ 125cc/hr -urine culture pending -pain management with tylenol, flexeril and oxycodone IR prn  2) Fetal well being -Cat. I tracing -continue q shift monitoring  3) Maternal well being -constipation- colace twice daily, miralax prn -PNV daily   DISP: Continue with antenatal care as outlined above. Once afebrile x 48hr will transition to oral/outpatient care  Sharon Seller 01/05/2021,11:01 AM

## 2021-01-06 DIAGNOSIS — O2302 Infections of kidney in pregnancy, second trimester: Secondary | ICD-10-CM | POA: Diagnosis not present

## 2021-01-06 DIAGNOSIS — Z3A25 25 weeks gestation of pregnancy: Secondary | ICD-10-CM | POA: Diagnosis not present

## 2021-01-06 LAB — CBC WITH DIFFERENTIAL/PLATELET
Abs Immature Granulocytes: 0.1 10*3/uL — ABNORMAL HIGH (ref 0.00–0.07)
Basophils Absolute: 0 10*3/uL (ref 0.0–0.1)
Basophils Relative: 0 %
Eosinophils Absolute: 0 10*3/uL (ref 0.0–0.5)
Eosinophils Relative: 0 %
HCT: 26.3 % — ABNORMAL LOW (ref 36.0–46.0)
Hemoglobin: 9 g/dL — ABNORMAL LOW (ref 12.0–15.0)
Immature Granulocytes: 1 %
Lymphocytes Relative: 8 %
Lymphs Abs: 0.8 10*3/uL (ref 0.7–4.0)
MCH: 30.7 pg (ref 26.0–34.0)
MCHC: 34.2 g/dL (ref 30.0–36.0)
MCV: 89.8 fL (ref 80.0–100.0)
Monocytes Absolute: 0.9 10*3/uL (ref 0.1–1.0)
Monocytes Relative: 9 %
Neutro Abs: 8.4 10*3/uL — ABNORMAL HIGH (ref 1.7–7.7)
Neutrophils Relative %: 82 %
Platelets: 136 10*3/uL — ABNORMAL LOW (ref 150–400)
RBC: 2.93 MIL/uL — ABNORMAL LOW (ref 3.87–5.11)
RDW: 14.1 % (ref 11.5–15.5)
WBC: 10.2 10*3/uL (ref 4.0–10.5)
nRBC: 0 % (ref 0.0–0.2)

## 2021-01-06 NOTE — Progress Notes (Signed)
Patient ID: Danielle Carter, female   DOB: 1996-04-10, 25 y.o.   MRN: [id number] FACULTY PRACTICE ANTEPARTUM(COMPREHENSIVE) NOTE  Danielle Carter is a 24 y.o. [id number] at [redacted]w[redacted]d who is admitted for pyelonephritis.   Fetal presentation is unsure. Length of Stay:  2  Days  Subjective: Still has back pain no fever Patient reports the fetal movement as active. Patient reports uterine contraction  activity as none. Patient reports  vaginal bleeding as none. Patient describes fluid per vagina as None.  Vitals:  Blood pressure 104/76, pulse (!) 127, temperature 99.7 F (37.6 C), temperature source Oral, resp. rate 18, height 5\' 4"  (1.626 m), weight 58.6 kg, last menstrual period 07/09/2020, SpO2 98 %, unknown if currently breastfeeding. Physical Examination:  General appearance - alert, well appearing, and in no distress Heart - normal rate and regular rhythm Abdomen - soft, nontender, nondistended, bilateral CVAT Fundal Height:  size equals dates Cervical Exam: Not evaluated. Extremities: extremities normal, atraumatic, no cyanosis or edema and Homans sign is negative, no sign of DVT Membranes:intact  Fetal Monitoring:   Fetal Heart Rate A   Mode External filed at 01/05/2021 2020  Baseline Rate (A) 155 bpm filed at 01/05/2021 2020  Variability 6-25 BPM filed at 01/05/2021 2020  Accelerations 15 x 15 filed at 01/05/2021 2020  Decelerations None filed at 01/05/2021 2020     Labs:  Results for orders placed or performed during the hospital encounter of 01/04/21 (from the past 24 hour(s))  CBC with Differential/Platelet   Collection Time: 01/06/21  5:03 AM  Result Value Ref Range   WBC 10.2 4.0 - 10.5 K/uL   RBC 2.93 (L) 3.87 - 5.11 MIL/uL   Hemoglobin 9.0 (L) 12.0 - 15.0 g/dL   HCT 03/08/21 (L) 14.7 - 82.9 %   MCV 89.8 80.0 - 100.0 fL   MCH 30.7 26.0 - 34.0 pg   MCHC 34.2 30.0 - 36.0 g/dL   RDW 56.2 13.0 - 86.5 %   Platelets 136 (L) 150 - 400 K/uL   nRBC 0.0 0.0 - 0.2 %   Neutrophils  Relative % PENDING %   Neutro Abs PENDING 1.7 - 7.7 K/uL   Band Neutrophils PENDING %   Lymphocytes Relative PENDING %   Lymphs Abs PENDING 0.7 - 4.0 K/uL   Monocytes Relative PENDING %   Monocytes Absolute PENDING 0.1 - 1.0 K/uL   Eosinophils Relative PENDING %   Eosinophils Absolute PENDING 0.0 - 0.5 K/uL   Basophils Relative PENDING %   Basophils Absolute PENDING 0.0 - 0.1 K/uL   WBC Morphology PENDING    RBC Morphology PENDING    Smear Review PENDING    Other PENDING %   nRBC PENDING 0 /100 WBC   Metamyelocytes Relative PENDING %   Myelocytes PENDING %   Promyelocytes Relative PENDING %   Blasts PENDING %   Immature Granulocytes PENDING %   Abs Immature Granulocytes PENDING 0.00 - 0.07 K/uL     Medications:  Scheduled . docusate sodium  100 mg Oral BID  . polyethylene glycol  17 g Oral Daily  . prenatal multivitamin  1 tablet Oral Q1200   I have reviewed the patient's current medications.  ASSESSMENT: Patient Active Problem List   Diagnosis Date Noted  . Pyelonephritis affecting pregnancy, antepartum 01/04/2021  . Candidal skin infection 11/23/2020  . Pelvic pain affecting pregnancy in second trimester, antepartum 11/23/2020  . Supervision of normal pregnancy, antepartum 09/29/2020  . History of preterm delivery 09/15/2019  .  Low grade squamous intraepith lesion on cytologic smear cervix (lgsil) 02/26/2019  . Gastric ulcer 10/15/2013  . Major depression 10/15/2013  . PTSD (post-traumatic stress disorder) 10/15/2013    PLAN: Review urine culture when resulted. Continue Unasyn   Danielle Carter 01/06/2021,7:54 AM

## 2021-01-06 NOTE — Progress Notes (Signed)
Called to see patient regarding her plan of care.  Pt very upset that she feels better, but cannot go home.  Explained to pt usual protocol of pyelonephritis and plan of care.  Objective findings are improved, and pt feels subjectively better.  Pt is still on IV antibiotics.  Pt informed she would likely next be converted to oral antibiotics and then hopefully discharged soon thereafter.  Friday discharge is probable, but not guaranteed.  Pt will be reassessed in the AM.  Will discuss with Dr. Charlotta Newton in the morning as well.    Mariel Aloe, MD

## 2021-01-07 ENCOUNTER — Other Ambulatory Visit (HOSPITAL_COMMUNITY): Payer: Self-pay

## 2021-01-07 DIAGNOSIS — Z3A26 26 weeks gestation of pregnancy: Secondary | ICD-10-CM | POA: Diagnosis not present

## 2021-01-07 DIAGNOSIS — N12 Tubulo-interstitial nephritis, not specified as acute or chronic: Secondary | ICD-10-CM

## 2021-01-07 DIAGNOSIS — O2302 Infections of kidney in pregnancy, second trimester: Secondary | ICD-10-CM | POA: Diagnosis not present

## 2021-01-07 LAB — CULTURE, OB URINE: Culture: >=100000 — AB

## 2021-01-07 MED ORDER — SULFAMETHOXAZOLE-TRIMETHOPRIM 800-160 MG PO TABS
1.0000 | ORAL_TABLET | Freq: Two times a day (BID) | ORAL | 0 refills | Status: AC
  Filled 2021-01-07: qty 28, 14d supply, fill #0

## 2021-01-07 MED ORDER — NITROFURANTOIN MONOHYD MACRO 100 MG PO CAPS
100.0000 mg | ORAL_CAPSULE | Freq: Every day | ORAL | 3 refills | Status: DC
  Filled 2021-01-07: qty 30, 30d supply, fill #0

## 2021-01-07 NOTE — Discharge Summary (Signed)
Physician Discharge Summary  Patient ID: Danielle Carter MRN: [id number] DOB/AGE: [birth year] 25 y.o.  Admit date: 01/04/2021 Discharge date: 01/07/2021  Admission Diagnoses: Pyelonephritis, Intrauterine pregnancy @ 26wk  Discharge Diagnoses:  Active Problems:   Pyelonephritis affecting pregnancy, antepartum   Discharged Condition: stable  Hospital Course: 25yo [id number]_0  admitted for pyelonephritis.  Treated with IV hydration, IV antibiotics and pain management.  Pt afebrile x 48hr with culture confirmed E.Coli.  Discharge home on outpatient antibiotic regimen and plan for close outpatient follow up.  Consults: None  Significant Diagnostic Studies: labs: urine culture- E.coli,  CBC Latest Ref Rng & Units 01/06/2021 01/05/2021 01/04/2021  WBC 4.0 - 10.5 K/uL 10.2 13.5(H) 12.6(H)  Hemoglobin 12.0 - 15.0 g/dL 9.0(L) 10.1(L) 11.7(L)  Hematocrit 36.0 - 46.0 % 26.3(L) 30.2(L) 35.0(L)  Platelets 150 - 400 K/uL 136(L) 183 183    Treatments: IV hydration, antibiotics: unasyn and tylenol, oxycodone, flexeril  Discharge Exam: Blood pressure (!) 97/52, pulse 90, temperature 97.6 F (36.4 C), temperature source Oral, resp. rate 18, height _1  (1.626 m), weight 58.6 kg, last menstrual period 07/09/2020, SpO2 100 %, unknown if currently breastfeeding. Physical Examination: CONSTITUTIONAL: Well-developed, well-nourished female in no acute distress.  HENT:  Normocephalic, atraumatic, External right and left ear normal. Oropharynx is clear and moist EYES: Conjunctivae and EOM are normal.  No scleral icterus.  NECK: Normal range of motion, supple, no masses. SKIN: Skin is warm and dry. No rash noted. Not diaphoretic. No erythema. No pallor. [place]: Alert and oriented to person, place, and time. Normal reflexes, muscle tone coordination. No cranial nerve deficit noted. PSYCHIATRIC: Normal mood and affect. Normal behavior. Normal judgment and thought content. CARDIOVASCULAR: Normal heart rate noted,  regular rhythm RESPIRATORY: Effort and breath sounds normal, no problems with respiration noted MUSCULOSKELETAL: Normal range of motion. No edema and no tenderness.  No CVA tnederness bilaterally ABDOMEN: Soft, nontender, nondistended, gravid. CERVIX: deferred    Disposition: Discharge disposition: 01-Home or Self Care       Discharge Instructions    Discharge activity:  No Restrictions   Complete by: As directed    Discharge diet:  No restrictions   Complete by: As directed    Discharge instructions   Complete by: As directed    Please take the Bactrim twice daily for 2 weeks then switch over to the Nitrofurantoin daily to prevent a future infection   No sexual activity restrictions   Complete by: As directed      Allergies as of 01/07/2021      Reactions   Abilify [aripiprazole] Other (See Comments)   Massive mood swings   Nickel Other (See Comments)   Turns green   Pristiq [desvenlafaxine] Other (See Comments)   Massive headaches   Rocephin [ceftriaxone Sodium In Dextrose] Hives   Latex Rash      Medication List    STOP taking these medications   promethazine 25 MG tablet Commonly known as: PHENERGAN   terconazole 0.4 % vaginal cream Commonly known as: TERAZOL 7     TAKE these medications   acetaminophen 500 MG tablet Commonly known as: TYLENOL Take 1,000 mg by mouth every 6 (six) hours as needed for mild pain or headache.   Blood Pressure Monitor Kit 1 each by Does not apply route daily.   clotrimazole-betamethasone cream Commonly known as: Lotrisone Apply 1 application topically 2 (two) times daily.   cyclobenzaprine 5 MG tablet Commonly known as: FLEXERIL Take 1-2 tablets (5-10 mg total) by mouth 3 (three)  times daily as needed for muscle spasms.   nitrofurantoin (macrocrystal-monohydrate) 100 MG capsule Commonly known as: Macrobid Take 1 capsule (100 mg total) by mouth at bedtime. To start in 2 wks- after finishing Bactrim DS   prenatal  multivitamin Tabs tablet Take 1 tablet by mouth daily at 12 noon.   sulfamethoxazole-trimethoprim 800-160 MG tablet Commonly known as: BACTRIM DS Take 1 tablet by mouth 2 (two) times daily for 14 days.       Follow-up Information    [facility] Follow up in 2 week(s).   Specialty: Obstetrics and Gynecology Why: Follow up as scheduled in 2weeks Contact information: [address], [place] [id number]              Signed: Annalee Genta 01/07/2021, 1:51 PM

## 2021-01-07 NOTE — Progress Notes (Signed)
FACULTY PRACTICE ANTEPARTUM PROGRESS NOTE  Danielle Carter is a 25 y.o. [id number] at [redacted]w[redacted]d who is admitted for pyelonephritis.  Estimated Date of Delivery: 04/15/21 Fetal presentation is unsure.  Length of Stay:  3 Days. Admitted 01/04/2021  Subjective: Pt seen, continues to feel well.  She denies fever/chills and there is no nausea/vomiting. Patient reports normal fetal movement.  She denies uterine contractions, denies bleeding and leaking of fluid per vagina.  Vitals:  Blood pressure (!) 98/59, pulse 79, temperature 97.6 F (36.4 C), temperature source Oral, resp. rate 18, height 5\' 4"  (1.626 m), weight 58.6 kg, last menstrual period 07/09/2020, SpO2 98 %, unknown if currently breastfeeding. Physical Examination: CONSTITUTIONAL: Well-developed, well-nourished female in no acute distress.  HENT:  Normocephalic, atraumatic, External right and left ear normal. Oropharynx is clear and moist EYES: Conjunctivae and EOM are normal.  No scleral icterus.  NECK: Normal range of motion, supple, no masses. SKIN: Skin is warm and dry. No rash noted. Not diaphoretic. No erythema. No pallor. NEUROLGIC: Alert and oriented to person, place, and time. Normal reflexes, muscle tone coordination. No cranial nerve deficit noted. PSYCHIATRIC: Normal mood and affect. Normal behavior. Normal judgment and thought content. CARDIOVASCULAR: Normal heart rate noted, regular rhythm RESPIRATORY: Effort and breath sounds normal, no problems with respiration noted MUSCULOSKELETAL: Normal range of motion. No edema and no tenderness.  No CVA tnederness bilaterally ABDOMEN: Soft, nontender, nondistended, gravid. CERVIX: deferred  Fetal monitoring: AM monitoring is pending Uterine activity: none reported  Results for orders placed or performed during the hospital encounter of 01/04/21 (from the past 48 hour(s))  CBC with Differential/Platelet     Status: Abnormal   Collection Time: 01/06/21  5:03 AM  Result Value Ref Range    WBC 10.2 4.0 - 10.5 K/uL   RBC 2.93 (L) 3.87 - 5.11 MIL/uL   Hemoglobin 9.0 (L) 12.0 - 15.0 g/dL   HCT 03/08/21 (L) 45.4 - 09.8 %   MCV 89.8 80.0 - 100.0 fL   MCH 30.7 26.0 - 34.0 pg   MCHC 34.2 30.0 - 36.0 g/dL   RDW 11.9 14.7 - 82.9 %   Platelets 136 (L) 150 - 400 K/uL   nRBC 0.0 0.0 - 0.2 %   Neutrophils Relative % 82 %   Neutro Abs 8.4 (H) 1.7 - 7.7 K/uL   Lymphocytes Relative 8 %   Lymphs Abs 0.8 0.7 - 4.0 K/uL   Monocytes Relative 9 %   Monocytes Absolute 0.9 0.1 - 1.0 K/uL   Eosinophils Relative 0 %   Eosinophils Absolute 0.0 0.0 - 0.5 K/uL   Basophils Relative 0 %   Basophils Absolute 0.0 0.0 - 0.1 K/uL   WBC Morphology See Note     Comment: Increased Bands. >20% Bands   Immature Granulocytes 1 %   Abs Immature Granulocytes 0.10 (H) 0.00 - 0.07 K/uL    Comment: Performed at [facility], [address] [place], [place]    I have reviewed the patient's current medications.  ASSESSMENT: Active Problems:   Pyelonephritis affecting pregnancy, antepartum   PLAN: Clinically improved. Pt afebrile the last 36 hours. Convert to po antibiotics, avoid cephalosporin per allergy, possible PM d/c   Continue routine antenatal care.   [id number], MD [facility] Faculty Attending, Center for [facility] 01/07/2021 7:33 AM

## 2021-01-07 NOTE — Plan of Care (Signed)

## 2021-01-07 NOTE — Progress Notes (Signed)
Reviewed management plan with patient.  Pt afebrile x 48hr.  Culture returned- plan for Bactrim DS x 2 weeks with plan to then transition to prophylactic regimen to continue for the remainder of the pregnancy.  Pt voiced understanding.  Plan to discharge home later today.  Myna Hidalgo, DO Attending Obstetrician & Gynecologist, [facility] for [employer], [facility]

## 2021-01-07 NOTE — Progress Notes (Signed)
Pt d/c information given, all questions answered, pt verbalized understanding. Pt is alert and oriented x4. Pt is ambulatory and pain controlled.

## 2021-01-07 NOTE — Discharge Instructions (Signed)
Pyelonephritis During Pregnancy  What are the causes? This condition is caused by a bacterial infection in the lower urinary tract that spreads to the kidney. What increases the risk? You are more likely to develop this condition if:  You have diabetes.  You have a history of frequent urinary tract infections. What are the signs or symptoms? Symptoms of this condition may begin with symptoms of a lower urinary tract infection. These may include:  A frequent urge to pass urine.  Burning pain when passing urine.  Pain and pressure in your lower abdomen.  Blood in your urine.  Cloudy or smelly urine. As the infection spreads to your kidney, you may have these symptoms:  Fever.  Chills.  Pain and tenderness in your upper abdomen or in your back and sides (flank pain). Flank pain often affects one side of the body, usually the right side.  Nausea, vomiting, or loss of appetite. How is this diagnosed? This condition may be diagnosed based on:  Your symptoms and medical history.  A physical exam.  Tests to confirm the diagnosis. These may include: ? Blood tests to check kidney function and look for signs of infection. ? Urine tests to check for signs of infection, including bacteria, white or red blood cells, and protein. ? Tests to grow and identify the type of bacteria that is causing the infection (urine culture). ? Imaging studies of your kidneys to learn more about your condition. How is this treated? This condition is treated in the hospital with antibiotics that are given into one of your veins through an IV. Your health care provider:  Will start you on an antibiotic that is effective against common urinary tract infections.  Please complete all the medication- twice daily for two week.  After that, switch to the daily antibiotic to help prevent this from recurring.  May switch to another antibiotic if the results of your urine culture show that your infection is  caused by different bacteria.  Will be careful to choose antibiotics that are the safest during pregnancy. Other treatments may include:  IV fluids if you are nauseous and not able to drink fluids.  Pain and fever medicines.  Medicines for nausea and vomiting. You will be able to go home when your infection is under control. To prevent another infection, you may need to continue taking antibiotics by mouth until your baby is born. Follow these instructions at home: Medicines  Take over-the-counter and prescription medicines only as told by your health care provider.  Take your antibiotic medicine as told by your health care provider. Do not stop taking the antibiotic even if you start to feel better.  Continue to take your prenatal vitamins.   Lifestyle  Follow instructions from your health care provider about eating or drinking restrictions. You may need to avoid: ? Foods and drinks with added sugar. ? Caffeine and fruit juice.  Drink enough fluid to keep your urine pale yellow.  Go to the bathroom frequently. Do not hold your urine. Try to empty your bladder completely.  Change your underwear every day. Wear all-cotton underwear. Do not wear tight underwear or pants.   General instructions  Take these steps to lower the risk of bacteria getting into your urinary tract: ? Use liquid soap instead of bar soap when showering or bathing. Bacteria can grow on bar soap. ? When you wash yourself, clean the urethra opening first. Use a washcloth to clean the area between your vagina and anus. Dennie Bible  the area dry with a clean towel. ? Wash your hands before and after you go to the bathroom. ? Wipe yourself from front to back after going to the bathroom. ? Do not use douches, perfumed soap, creams, or powders. ? Do not soak in a bath for more than 30 minutes.  Return to your normal activities as told by your health care provider. Ask your health care provider what activities are safe for  you.  Keep all follow-up visits as told by your health care provider. This is important.   Contact a health care provider if:  You have chills or a fever.  You have any symptoms of infection that do not get better at home.  Symptoms of infection come back.  You have a reaction or side effects from your antibiotic. Get help right away if:  You start having contractions. Summary  Pyelonephritis is an infection of the kidney or kidneys.  This condition results when a bacterial infection in the lower urinary tract spreads to the kidney.  Lower urinary tract infections are common during pregnancy.  Pyelonephritis causes chills, a fever, flank pain, and nausea.  Pyelonephritis is a serious infection that is usually treated in the hospital with IV antibiotics. This information is not intended to replace advice given to you by your health care provider. Make sure you discuss any questions you have with your health care provider. Document Revised: 01/10/2019 Document Reviewed: 12/20/2017 Elsevier Patient Education  2021 [employer].

## 2021-01-10 ENCOUNTER — Telehealth: Payer: Self-pay

## 2021-01-10 NOTE — Telephone Encounter (Signed)
Transition Care Management Unsuccessful Follow-up Telephone Call  Date of discharge and from where:  01/07/2021 [facility]  Attempts:  1st Attempt  Reason for unsuccessful TCM follow-up call:  Left voice message

## 2021-01-11 ENCOUNTER — Encounter: Payer: Self-pay | Admitting: Physical Therapy

## 2021-01-11 ENCOUNTER — Ambulatory Visit: Payer: Medicaid Other | Admitting: Physical Therapy

## 2021-01-11 ENCOUNTER — Other Ambulatory Visit: Payer: Self-pay

## 2021-01-11 DIAGNOSIS — M6281 Muscle weakness (generalized): Secondary | ICD-10-CM

## 2021-01-11 DIAGNOSIS — R102 Pelvic and perineal pain: Secondary | ICD-10-CM | POA: Diagnosis not present

## 2021-01-11 DIAGNOSIS — M549 Dorsalgia, unspecified: Secondary | ICD-10-CM

## 2021-01-11 DIAGNOSIS — M545 Low back pain, unspecified: Secondary | ICD-10-CM | POA: Diagnosis not present

## 2021-01-11 DIAGNOSIS — O99891 Other specified diseases and conditions complicating pregnancy: Secondary | ICD-10-CM

## 2021-01-11 NOTE — Therapy (Signed)
[facility] [address] [place], [place], [address] Phone: [phone]   Fax:  [phone]  Physical Therapy Treatment  Patient Details  Name: Danielle Carter MRN: [id number] Date of Birth: 08/22/1996 Referring Provider (PT): Arbie Cookey   Encounter Date: 01/11/2021   PT End of Session - 01/11/21 1610    Visit Number 2    Date for PT Re-Evaluation 03/28/21    Authorization Type Healthy blue    Authorization Time Period 4/4-6/27    PT Start Time 1500    PT Stop Time 1550    PT Time Calculation (min) 50 min    Activity Tolerance Patient tolerated treatment well;Patient limited by pain;No increased pain    Behavior During Therapy WFL for tasks assessed/performed           Past Medical History:  Diagnosis Date  . Anemia   . Anxiety   . Depression    going well  . Headache    'occular'  . Infection    UTI  . PTSD (post-traumatic stress disorder)   . Vaginal Pap smear, abnormal    ok since    Past Surgical History:  Procedure Laterality Date  . DRUG INDUCED ENDOSCOPY      There were no vitals filed for this visit.   Subjective Assessment - 01/11/21 1506    Subjective Patient was in  the hospital for 4 days and discharged last friday due to kidney infection. I can straighten my back better. The flexeril has helpe drelax my muscles.    Patient Stated Goals reduce pain and function    Currently in Pain? Yes    Pain Score 6    worse 9/10   Pain Location Pelvis   left groin and into the left hip is worse   Pain Orientation Left    Pain Descriptors / Indicators Aching    Pain Type Acute pain    Pain Onset More than a month ago    Pain Frequency Constant    Aggravating Factors  any movement    Pain Relieving Factors sitting    Multiple Pain Sites No                             [facility] Adult PT Treatment/Exercise - 01/11/21 0001      Ambulation/Gait   Gait Comments walking  keeps the left leg out to the side and reduction o fthe left knee flexion and extension and hip hikes on the left; education on walking with pressing heel into the ground to activate the gluteals and swing her arms      Therapeutic Activites    Therapeutic Activities Other Therapeutic Activities    Other Therapeutic Activities educated patient on how to move in bed with engagement of the TA and keeping her knees together      Knee/Hip Exercises: Standing   Other Standing Knee Exercises standing and pressing her left heel into the floor while preventing her trunk from leaning to the left side      Knee/Hip Exercises: Sidelying   Other Sidelying Knee/Hip Exercises left hip iliacus pull back with tactile cues to bring left pelvis back and down; left sidely with opening of the left lumbar spine      Manual Therapy   Manual Therapy Joint mobilization;Soft tissue mobilization;Myofascial release;Muscle Energy Technique    Joint Mobilization left hip mobilization for inferior glide, posterior glide, anterior glide,  and distraction,    Soft tissue mobilization trigger point release to the left quadratus and piriformis, and hip adductor; manual mobilization to the left hip adductor, left levator internist, left obturator internist    Myofascial Release to left hip adductor, left hip flexor and psoas    Muscle Energy Technique to correct the left iilum in sidely                  PT Education - 01/11/21 1610    Education Details standing press left heel into the ground to activate the gluteals    Person(s) Educated Patient    Methods Explanation;Demonstration    Comprehension Returned demonstration;Verbalized understanding            PT Short Term Goals - 01/03/21 1305      PT SHORT TERM GOAL #1   Title independent with initial HEP for pain relief and stretches    Baseline not educated yet    Time 4    Period Weeks    Status New    Target Date 01/31/21             PT Long  Term Goals - 01/03/21 1306      PT LONG TERM GOAL #1   Title independent with advanced HEP    Baseline not educated yet    Time 12    Period Weeks    Status New    Target Date 03/28/21      PT LONG TERM GOAL #2   Title able to move in bed and get in and out with pain level </= 4/5 using correct body mechanics    Time 12    Period Weeks    Status New    Target Date 03/28/21      PT LONG TERM GOAL #3   Title able to stand erect with hip extension at 0 degrees instead of -20 degrees due to increased spinal mobility    Baseline stands with 20 degress of hip flexion    Time 12    Period Weeks    Status New    Target Date 03/28/21      PT LONG TERM GOAL #4   Title understand ways to give birth to reduce the strain on the lumbar and pelvic region    Baseline not educated yet    Time 12    Period Weeks    Status New    Target Date 03/28/21                 Plan - 01/11/21 1612    Clinical Impression Statement Patient was in the hospital for 4 days due to a kdney infection. Patient came in walking with her left hip abducted and hiked her left hip. After therapy she was able to walk heel toe and did not have left hip pain. Patient had many trigger points in her left hip, left pelvic floor, left quadratus, left hip adductor and rotators. Patient had difficulty relaxing her left hip muscles and made it difficult to unlock it. Patient pelvis was in correct alignment after therapy. When patient stands and presses her left heel into the floor she will lean her trunk toward the left to assist her. Patient has learned how to roll and move in bed with engagement of the lower abdominals and keeping her knees together. Patient will benefit from skilled therapy to improve mobility and reduce pain as her pregnancy progresses.    Personal Factors and Comorbidities Fitness;Past/Current  Experience;Profession;Comorbidity 1    Comorbidities presently pregnant    Examination-Activity Limitations Bed  Mobility;Bend;Lift;Stand;Squat;Stairs;Sit;Caring for Others;Carry;Locomotion Level;Transfers    Examination-Participation Restrictions Meal Prep;Cleaning;Occupation;Driving;Shop;Laundry    Stability/Clinical Decision Making Evolving/Moderate complexity    Rehab Potential Good    PT Frequency 2x / week    PT Duration 12 weeks    PT Treatment/Interventions ADLs/Self Care Home Management;Cryotherapy;Moist Heat;Neuromuscular re-education;Therapeutic exercise;Therapeutic activities;Functional mobility training;Patient/family education;Manual techniques;Passive range of motion;Joint Manipulations;Spinal Manipulations;Taping    PT Next Visit Plan see if pelvis in correct alignment; work on gluteal engagement; work on [employer] stability, go over gait    PT Home Exercise Plan Access Code: [email or web address]    Recommended Other Services MD signed the initial eval    Consulted and Agree with Plan of Care Patient           Patient will benefit from skilled therapeutic intervention in order to improve the following deficits and impairments:  Decreased coordination,Decreased range of motion,Increased fascial restricitons,Decreased endurance,Increased muscle spasms,Decreased activity tolerance,Pain,Improper body mechanics,Decreased strength,Decreased mobility  Visit Diagnosis: Muscle weakness (generalized)  Acute bilateral low back pain without sciatica  Pelvic pain     Problem List Patient Active Problem List   Diagnosis Date Noted  . Pyelonephritis affecting pregnancy, antepartum 01/04/2021  . Candidal skin infection 11/23/2020  . Pelvic pain affecting pregnancy in second trimester, antepartum 11/23/2020  . Supervision of normal pregnancy, antepartum 09/29/2020  . History of preterm delivery 09/15/2019  . Low grade squamous intraepith lesion on cytologic smear cervix (lgsil) 02/26/2019  . Gastric ulcer 10/15/2013  . Major depression 10/15/2013  . PTSD (post-traumatic stress disorder) 10/15/2013     Eulis Foster, PT 01/11/21 4:19 PM   [facility] [address], [address] [place], [place], [address] Phone: [phone]   Fax:  [phone]  Name: ELVIA AYDIN MRN: [id number] Date of Birth: [birth year]

## 2021-01-11 NOTE — Telephone Encounter (Signed)
Transition Care Management Unsuccessful Follow-up Telephone Call  Date of discharge and from where:  01/07/2021 from [facility]  Attempts:  2nd Attempt  Reason for unsuccessful TCM follow-up call:  Unable to leave message

## 2021-01-12 NOTE — Telephone Encounter (Signed)
Transition Care Management Unsuccessful Follow-up Telephone Call  Date of discharge and from where:  01/07/2021 from [facility]  Attempts:  3rd Attempt  Reason for unsuccessful TCM follow-up call:  Unable to reach patient

## 2021-01-13 ENCOUNTER — Encounter: Payer: Self-pay | Admitting: Physical Therapy

## 2021-01-13 ENCOUNTER — Other Ambulatory Visit: Payer: Self-pay

## 2021-01-13 ENCOUNTER — Ambulatory Visit: Payer: Medicaid Other | Admitting: Physical Therapy

## 2021-01-13 DIAGNOSIS — M6281 Muscle weakness (generalized): Secondary | ICD-10-CM | POA: Diagnosis not present

## 2021-01-13 DIAGNOSIS — M545 Low back pain, unspecified: Secondary | ICD-10-CM | POA: Diagnosis not present

## 2021-01-13 DIAGNOSIS — R102 Pelvic and perineal pain: Secondary | ICD-10-CM

## 2021-01-13 NOTE — Patient Instructions (Signed)
Access Code: [email or web address] URL: [email or web address] Date: 01/13/2021 Prepared by: Eulis Foster  Exercises Quadruped Cat Camel - 1 x daily - 7 x weekly - 1 sets - 10 reps Child's Pose Stretch - 1 x daily - 7 x weekly - 1 sets - 1 reps - 15 sec hold Seated Pelvic Tilt - 2 x daily - 7 x weekly - 1 sets - 10 reps Seated Pelvic Floor Contraction - 1 x daily - 7 x weekly - 3 sets - 10 reps Seated Pelvic Floor Contraction - 2 x daily - 7 x weekly - 1 sets - 5 reps Supine Bridge - 1 x daily - 7 x weekly - 1 sets - 10 reps Hip Hinge Rock Back - 1 x daily - 7 x weekly - 1 sets - 10 reps [facility] [address], [address] [place], [place] [address] Phone # [phone] Fax [phone]

## 2021-01-13 NOTE — Therapy (Signed)
[facility] [address], [address] [place], [place], [address] Phone: [phone]   Fax:  [phone]  Physical Therapy Treatment  Patient Details  Name: Danielle Carter MRN: [id number] Date of Birth: October 18, 1995 Referring Provider (PT): Arbie Cookey   Encounter Date: 01/13/2021   PT End of Session - 01/13/21 0855    Visit Number 3    Date for PT Re-Evaluation 03/28/21    Authorization Type Healthy blue    Authorization Time Period 4/4-6/27    PT Start Time 0856   tied up at check in   PT Stop Time 0925    PT Time Calculation (min) 29 min    Activity Tolerance Patient tolerated treatment well;Patient limited by pain;No increased pain    Behavior During Therapy WFL for tasks assessed/performed           Past Medical History:  Diagnosis Date  . Anemia   . Anxiety   . Depression    going well  . Headache    'occular'  . Infection    UTI  . PTSD (post-traumatic stress disorder)   . Vaginal Pap smear, abnormal    ok since    Past Surgical History:  Procedure Laterality Date  . DRUG INDUCED ENDOSCOPY      There were no vitals filed for this visit.   Subjective Assessment - 01/13/21 0857    Subjective My hip is bothering me. My muscle were sore. I have pulling in low back.    Patient Stated Goals reduce pain and function    Currently in Pain? Yes    Pain Score 6     Pain Location Pelvis    Pain Orientation Left    Pain Descriptors / Indicators Aching;Sore    Pain Type Acute pain    Pain Onset More than a month ago    Pain Frequency Constant    Aggravating Factors  any movement    Pain Relieving Factors sitting    Multiple Pain Sites No                             [facility] Adult PT Treatment/Exercise - 01/13/21 0001      Lumbar Exercises: Supine   Bridge 15 reps;1 second      Lumbar Exercises: Quadruped   Other Quadruped Lumbar Exercises hip hinge with tactile cues to keep spinal  neutral      Manual Therapy   Manual Therapy Soft tissue mobilization;Myofascial release    Manual therapy comments prone with valley for the abdomen    Soft tissue mobilization manual work on the post. lower rib cage intercoastals, along the lquadratus, bil. gulteals, coccygeus and levator ani    Myofascial Release using the suction cup to release the fascial on the thoracic, post. rib cage, lumbar and bilateral buttocks                  PT Education - 01/13/21 0927    Education Details Access Code: [email or web address]    Person(s) Educated Patient    Methods Explanation;Demonstration;Verbal cues;Handout    Comprehension Returned demonstration;Verbalized understanding            PT Short Term Goals - 01/13/21 0930      PT SHORT TERM GOAL #1   Title independent with initial HEP for pain relief and stretches    Time 4    Period Weeks    Status Achieved  PT Long Term Goals - 01/03/21 1306      PT LONG TERM GOAL #1   Title independent with advanced HEP    Baseline not educated yet    Time 12    Period Weeks    Status New    Target Date 03/28/21      PT LONG TERM GOAL #2   Title able to move in bed and get in and out with pain level </= 4/5 using correct body mechanics    Time 12    Period Weeks    Status New    Target Date 03/28/21      PT LONG TERM GOAL #3   Title able to stand erect with hip extension at 0 degrees instead of -20 degrees due to increased spinal mobility    Baseline stands with 20 degress of hip flexion    Time 12    Period Weeks    Status New    Target Date 03/28/21      PT LONG TERM GOAL #4   Title understand ways to give birth to reduce the strain on the lumbar and pelvic region    Baseline not educated yet    Time 12    Period Weeks    Status New    Target Date 03/28/21                 Plan - 01/13/21 0902    Clinical Impression Statement Patient pelvis in correct alignment today. She has increased fascial tightness  in the lumbar and low thoracic area. Patient had trigger points in the pelvic floor muscles that were release externally. Patient is working on putting more weight into the left leg while walking and her left hip is not as abducted. Patient needs tactile cues to keep spinal neutral with hip hinge. Patient will benefit from skilled therapy to improve mobility and reduce pain as her pregnancy progresses.    Personal Factors and Comorbidities Fitness;Past/Current Experience;Profession;Comorbidity 1    Comorbidities presently pregnant    Examination-Activity Limitations Bed Mobility;Bend;Lift;Stand;Squat;Stairs;Sit;Caring for Others;Carry;Locomotion Level;Transfers    Examination-Participation Restrictions Meal Prep;Cleaning;Occupation;Driving;Shop;Laundry    Stability/Clinical Decision Making Evolving/Moderate complexity    Rehab Potential Good    PT Frequency 2x / week    PT Duration 12 weeks    PT Treatment/Interventions ADLs/Self Care Home Management;Cryotherapy;Moist Heat;Neuromuscular re-education;Therapeutic exercise;Therapeutic activities;Functional mobility training;Patient/family education;Manual techniques;Passive range of motion;Joint Manipulations;Spinal Manipulations;Taping    PT Next Visit Plan see if pelvis in correct alignment; work on gluteal engagement; work on [employer] stability, go over gait; manual work on back    PT Home Exercise Plan Access Code: [email or web address]    Consulted and Agree with Plan of Care Patient           Patient will benefit from skilled therapeutic intervention in order to improve the following deficits and impairments:  Decreased coordination,Decreased range of motion,Increased fascial restricitons,Decreased endurance,Increased muscle spasms,Decreased activity tolerance,Pain,Improper body mechanics,Decreased strength,Decreased mobility  Visit Diagnosis: Muscle weakness (generalized)  Acute bilateral low back pain without sciatica  Pelvic pain     Problem  List Patient Active Problem List   Diagnosis Date Noted  . Pyelonephritis affecting pregnancy, antepartum 01/04/2021  . Candidal skin infection 11/23/2020  . Pelvic pain affecting pregnancy in second trimester, antepartum 11/23/2020  . Supervision of normal pregnancy, antepartum 09/29/2020  . History of preterm delivery 09/15/2019  . Low grade squamous intraepith lesion on cytologic smear cervix (lgsil) 02/26/2019  . Gastric ulcer 10/15/2013  . Major  depression 10/15/2013  . PTSD (post-traumatic stress disorder) 10/15/2013    Eulis Foster, PT 01/13/21 9:31 AM   [facility] [address], [address] [place], [place], [address] Phone: [phone]   Fax:  [phone]  Name: Danielle Carter MRN: [id number] Date of Birth: 09-12-1996

## 2021-01-17 ENCOUNTER — Other Ambulatory Visit: Payer: Self-pay

## 2021-01-17 ENCOUNTER — Encounter: Payer: Self-pay | Admitting: Physical Therapy

## 2021-01-17 ENCOUNTER — Ambulatory Visit: Payer: Medicaid Other | Admitting: Physical Therapy

## 2021-01-17 DIAGNOSIS — R102 Pelvic and perineal pain: Secondary | ICD-10-CM

## 2021-01-17 DIAGNOSIS — M6281 Muscle weakness (generalized): Secondary | ICD-10-CM

## 2021-01-17 DIAGNOSIS — M545 Low back pain, unspecified: Secondary | ICD-10-CM

## 2021-01-17 NOTE — Therapy (Signed)
[facility] [address], [address] [place], [place], [address] Phone: [phone]   Fax:  [phone]  Physical Therapy Treatment  Patient Details  Name: Danielle Carter MRN: [id number] Date of Birth: [birth year] Referring Provider (PT): Arbie Cookey   Encounter Date: 01/17/2021   PT End of Session - 01/17/21 0843    Visit Number 4    Date for PT Re-Evaluation 03/28/21    Authorization Type Healthy blue    Authorization Time Period 4/4-6/27    PT Start Time 0800    PT Stop Time 0839    PT Time Calculation (min) 39 min    Activity Tolerance Patient tolerated treatment well;Patient limited by pain;No increased pain    Behavior During Therapy WFL for tasks assessed/performed           Past Medical History:  Diagnosis Date  . Anemia   . Anxiety   . Depression    going well  . Headache    'occular'  . Infection    UTI  . PTSD (post-traumatic stress disorder)   . Vaginal Pap smear, abnormal    ok since    Past Surgical History:  Procedure Laterality Date  . DRUG INDUCED ENDOSCOPY      There were no vitals filed for this visit.   Subjective Assessment - 01/17/21 0812    Subjective I have had intense pelvic floor pressure that is causing rubbing in the groin and making hard to walk.    Patient Stated Goals reduce pain and function    Currently in Pain? Yes    Pain Score 7     Pain Location Pelvis    Pain Orientation Left    Pain Descriptors / Indicators Aching;Sore    Pain Type Acute pain    Pain Onset More than a month ago    Pain Frequency Constant    Aggravating Factors  any movement    Pain Relieving Factors sitting    Multiple Pain Sites No                             [facility] Adult PT Treatment/Exercise - 01/17/21 0001      Knee/Hip Exercises: Sidelying   Other Sidelying Knee/Hip Exercises sicely pelvic floor contraction holding for 5 seconds 5 times wtih tactile cues  externally      Manual Therapy   Manual Therapy Soft tissue mobilization;Myofascial release    Manual therapy comments prone with valley for the abdomen    Soft tissue mobilization manual work on the post. lower rib cage intercoastals, along the lquadratus, bil. gulteals, coccygeus and levator ani; hip adductor    Myofascial Release using the suction cup to release the fascial on the thoracic, post. rib cage, lumbar and bilateral buttocks                  PT Education - 01/17/21 0843    Education Details Access Code: [email or web address]; information on SI belt    Person(s) Educated Patient    Methods Explanation;Demonstration;Handout    Comprehension Returned demonstration;Verbalized understanding            PT Short Term Goals - 01/13/21 0930      PT SHORT TERM GOAL #1   Title independent with initial HEP for pain relief and stretches    Time 4    Period Weeks    Status Achieved  PT Long Term Goals - 01/17/21 0847      PT LONG TERM GOAL #1   Title independent with advanced HEP    Time 12    Period Weeks    Status On-going      PT LONG TERM GOAL #2   Title able to move in bed and get in and out with pain level </= 4/5 using correct body mechanics    Baseline pain level 7-8/10    Time 12    Period Weeks    Status On-going      PT LONG TERM GOAL #3   Title able to stand erect with hip extension at 0 degrees instead of -20 degrees due to increased spinal mobility    Baseline stands with 20 degress of hip flexion; able to stand upright better.    Time 12    Period Weeks    Status On-going      PT LONG TERM GOAL #4   Title understand ways to give birth to reduce the strain on the lumbar and pelvic region    Time 30    Period Weeks    Status On-going                 Plan - 01/17/21 0844    Clinical Impression Statement Patient pain decreased to 5/10 instead of 8/10 today. Patient  is able to lay on her back with reduced lordosis. Patient is  able to contract her pelvic floor correctly. Patient would benefit from a SI belt to help stabilize her pelvis and reduce the pubic symphysis pain. Patient will need to work on strengthening around the SI joint to assist with stability during her pregnancy. Patient has less fascial restrictions in the lumbar fascial. She has trigger points in the hip adductors, quadratus and levator ani. Patient will benefit from skilled therapy to improve mobility and reduce pain as her pregnancy progresses.    Personal Factors and Comorbidities Fitness;Past/Current Experience;Profession;Comorbidity 1    Comorbidities presently pregnant    Examination-Activity Limitations Bed Mobility;Bend;Lift;Stand;Squat;Stairs;Sit;Caring for Others;Carry;Locomotion Level;Transfers    Examination-Participation Restrictions Meal Prep;Cleaning;Occupation;Driving;Shop;Laundry    Stability/Clinical Decision Making Evolving/Moderate complexity    Rehab Potential Good    PT Frequency 2x / week    PT Duration 12 weeks    PT Treatment/Interventions ADLs/Self Care Home Management;Cryotherapy;Moist Heat;Neuromuscular re-education;Therapeutic exercise;Therapeutic activities;Functional mobility training;Patient/family education;Manual techniques;Passive range of motion;Joint Manipulations;Spinal Manipulations;Taping    PT Next Visit Plan see how MD appt. went, check on alignment, SI stability    PT Home Exercise Plan Access Code: [email or web address]    Consulted and Agree with Plan of Care Patient           Patient will benefit from skilled therapeutic intervention in order to improve the following deficits and impairments:  Decreased coordination,Decreased range of motion,Increased fascial restricitons,Decreased endurance,Increased muscle spasms,Decreased activity tolerance,Pain,Improper body mechanics,Decreased strength,Decreased mobility  Visit Diagnosis: Muscle weakness (generalized)  Acute bilateral low back pain without sciatica  Pelvic  pain     Problem List Patient Active Problem List   Diagnosis Date Noted  . Pyelonephritis affecting pregnancy, antepartum 01/04/2021  . Candidal skin infection 11/23/2020  . Pelvic pain affecting pregnancy in second trimester, antepartum 11/23/2020  . Supervision of normal pregnancy, antepartum 09/29/2020  . History of preterm delivery 09/15/2019  . Low grade squamous intraepith lesion on cytologic smear cervix (lgsil) 02/26/2019  . Gastric ulcer 10/15/2013  . Major depression 10/15/2013  . PTSD (post-traumatic stress disorder) 10/15/2013    Elnita Maxwell  Wallace Cullens, PT 01/17/21 8:48 AM   [facility] [address] [place], [place], [address] Phone: [phone]   Fax:  [phone]  Name: Danielle Carter MRN: [id number] Date of Birth: 01-Sep-1996

## 2021-01-17 NOTE — Patient Instructions (Signed)
Access Code: [email or web address] URL: [email or web address] Date: 01/17/2021 Prepared by: Eulis Foster  Exercises Quadruped Cat Camel - 1 x daily - 7 x weekly - 1 sets - 10 reps Child's Pose Stretch - 1 x daily - 7 x weekly - 1 sets - 1 reps - 15 sec hold Seated Pelvic Tilt - 2 x daily - 7 x weekly - 1 sets - 10 reps Seated Pelvic Floor Contraction - 1 x daily - 7 x weekly - 3 sets - 10 reps Seated Pelvic Floor Contraction - 2 x daily - 7 x weekly - 1 sets - 5 reps Supine Bridge - 1 x daily - 7 x weekly - 1 sets - 10 reps Hip Hinge Rock Back - 1 x daily - 7 x weekly - 1 sets - 10 reps Sidelying Pelvic Floor Contraction with Self-Palpation - 2-3 x daily - 7 x weekly - 1 sets - 5 reps - 5 sec hold [facility] [address] [place], [place] [address] Phone # [phone] Fax [phone]

## 2021-01-20 ENCOUNTER — Ambulatory Visit (INDEPENDENT_AMBULATORY_CARE_PROVIDER_SITE_OTHER): Payer: Medicaid Other | Admitting: Obstetrics and Gynecology

## 2021-01-20 ENCOUNTER — Other Ambulatory Visit: Payer: Self-pay

## 2021-01-20 ENCOUNTER — Ambulatory Visit: Payer: Medicaid Other | Admitting: Physical Therapy

## 2021-01-20 ENCOUNTER — Encounter: Payer: Self-pay | Admitting: Physical Therapy

## 2021-01-20 VITALS — BP 110/67 | HR 79 | Wt 134.0 lb

## 2021-01-20 DIAGNOSIS — Z3A27 27 weeks gestation of pregnancy: Secondary | ICD-10-CM | POA: Insufficient documentation

## 2021-01-20 DIAGNOSIS — R102 Pelvic and perineal pain: Secondary | ICD-10-CM

## 2021-01-20 DIAGNOSIS — O2302 Infections of kidney in pregnancy, second trimester: Secondary | ICD-10-CM

## 2021-01-20 DIAGNOSIS — M545 Low back pain, unspecified: Secondary | ICD-10-CM | POA: Diagnosis not present

## 2021-01-20 DIAGNOSIS — M6281 Muscle weakness (generalized): Secondary | ICD-10-CM

## 2021-01-20 DIAGNOSIS — Z3490 Encounter for supervision of normal pregnancy, unspecified, unspecified trimester: Secondary | ICD-10-CM

## 2021-01-20 DIAGNOSIS — O26892 Other specified pregnancy related conditions, second trimester: Secondary | ICD-10-CM

## 2021-01-20 DIAGNOSIS — R87612 Low grade squamous intraepithelial lesion on cytologic smear of cervix (LGSIL): Secondary | ICD-10-CM

## 2021-01-20 DIAGNOSIS — Z23 Encounter for immunization: Secondary | ICD-10-CM

## 2021-01-20 DIAGNOSIS — O2300 Infections of kidney in pregnancy, unspecified trimester: Secondary | ICD-10-CM

## 2021-01-20 DIAGNOSIS — Z8751 Personal history of pre-term labor: Secondary | ICD-10-CM

## 2021-01-20 NOTE — Patient Instructions (Addendum)
  [employer] 5.0 (33)  Maternity store [address]  [phone] Open ? Closes 5PM  "This is my favorite Arboriculturist."  [employer] 4.6 (49)  Consignment shop [employer] [address]  [phone] Open ? Closes 6PM In-store shopping  [place] 4.1 (10)  Consignment shop [address]  [phone] Open ? Closes 5:30PM In-store shopping  [employer] Phone: [phone]  [facility] [address] [place], [place] [address] Phone # [phone] Fax [phone]

## 2021-01-20 NOTE — Progress Notes (Signed)
   PRENATAL VISIT NOTE  Subjective:  Danielle Carter is a 25 y.o. N4O2703 at [redacted]w[redacted]d being seen today for ongoing prenatal care.  She is currently monitored for the following issues for this high-risk pregnancy and has Low grade squamous intraepith lesion on cytologic smear cervix (lgsil); Gastric ulcer; Major depression; PTSD (post-traumatic stress disorder); History of preterm delivery; Supervision of normal pregnancy, antepartum; Candidal skin infection; Pelvic pain affecting pregnancy in second trimester, antepartum; Pyelonephritis affecting pregnancy, antepartum; and [redacted] weeks gestation of pregnancy on their problem list.  Patient doing well with continued discomfort with ambulation and position change. She reports backache.  Contractions: Irritability. Vag. Bleeding: None.  Movement: Present. Denies leaking of fluid.   The following portions of the patient's history were reviewed and updated as appropriate: allergies, current medications, past family history, past medical history, past social history, past surgical history and problem list. Problem list updated.  Objective:   Vitals:   01/20/21 0947  BP: 110/67  Pulse: 79  Weight: 134 lb (60.8 kg)    Fetal Status: Fetal Heart Rate (bpm): 143   Movement: Present     General:  Alert, oriented and cooperative. Patient is in no acute distress.  Skin: Skin is warm and dry. No rash noted.   Cardiovascular: Normal heart rate noted  Respiratory: Normal respiratory effort, no problems with respiration noted  Abdomen: Soft, gravid, appropriate for gestational age.  Pain/Pressure: Present     Pelvic: Cervical exam deferred        Extremities: Normal range of motion.  Edema: None  Mental Status:  Normal mood and affect. Normal behavior. Normal judgment and thought content.   Assessment and Plan:  Pregnancy: G3P1102 at [redacted]w[redacted]d  1. Encounter for supervision of normal pregnancy, antepartum, unspecified gravidity Continue routine care.  Pt is  considering initiating long term disability since she is so uncomfortable with movement.  Pt continues to attend physical therapy.  Pt advised to try pregnancy belt as well.  - Tdap vaccine greater than or equal to 7yo IM - Glucose Tolerance, 2 Hours w/1 Hour - RPR - HIV antibody (with reflex) - CBC  2. Pyelonephritis affecting pregnancy, antepartum Per previous note, continue abx  3. History of preterm delivery No s/sx of PTL  4. Low grade squamous intraepith lesion on cytologic smear cervix (lgsil)   5. [redacted] weeks gestation of pregnancy   6. Pelvic pain affecting pregnancy in second trimester, antepartum   Preterm labor symptoms and general obstetric precautions including but not limited to vaginal bleeding, contractions, leaking of fluid and fetal movement were reviewed in detail with the patient.  Please refer to After Visit Summary for other counseling recommendations.   Return in about 2 weeks (around 02/03/2021) for virtual, ROB.   Mariel Aloe, MD Faculty Attending [facility]

## 2021-01-20 NOTE — Therapy (Signed)
[facility] [address] [place], [place], [address] Phone: [phone]   Fax:  [phone]  Physical Therapy Treatment  Patient Details  Name: Danielle Carter MRN: [id number] Date of Birth: 1995-12-23 Referring Provider (PT): Arbie Cookey   Encounter Date: 01/20/2021   PT End of Session - 01/20/21 1315    Visit Number 5    Date for PT Re-Evaluation 03/28/21    Authorization Type [employer]    Authorization Time Period 4/4-6/27    Authorization - Visit Number 5    Authorization - Number of Visits 24    PT Start Time 1230    PT Stop Time 1310    PT Time Calculation (min) 40 min    Activity Tolerance Patient tolerated treatment well;Patient limited by pain;No increased pain    Behavior During Therapy WFL for tasks assessed/performed           Past Medical History:  Diagnosis Date  . Anemia   . Anxiety   . Depression    going well  . Headache    'occular'  . Infection    UTI  . PTSD (post-traumatic stress disorder)   . Vaginal Pap smear, abnormal    ok since    Past Surgical History:  Procedure Laterality Date  . DRUG INDUCED ENDOSCOPY      There were no vitals filed for this visit.   Subjective Assessment - 01/20/21 1235    Subjective I saw the doctor. I am doing the long term disability. PT helps when she comes to therapy and will feel better but at the end of the day the pain is worse. When go to move her legs or walk and she feels stuck. Exercises help her with alignment but make pain worse while doing them.    Patient Stated Goals reduce pain and function    Currently in Pain? Yes    Pain Score 8     Pain Location Pelvis    Pain Orientation Left    Pain Descriptors / Indicators Aching;Sore    Pain Type Acute pain    Pain Onset More than a month ago    Pain Frequency Constant    Aggravating Factors  any movement    Pain Relieving Factors sitting    Multiple Pain Sites No                              [facility] Adult PT Treatment/Exercise - 01/20/21 0001      Self-Care   Self-Care Other Self-Care Comments    Other Self-Care Comments  educated patient on places she is able to find maternity belts at [employer] and [employer]; checked on [facility] for cheaper maternity belts; discussed with patient on different ways to give labor      Exercises   Exercises Other Exercises    Other Exercises  prone gluteal squeezes holfing 5 sec 5 times      Manual Therapy   Manual Therapy Soft tissue mobilization;Myofascial release    Soft tissue mobilization manual work on the post. lower rib cage intercoastals, along the lquadratus, bil. gulteals, coccygeus and levator ani; hip adductor    Myofascial Release using the suction cup to release the fascial on the thoracic, post. rib cage, lumbar and bilateral buttocks                  PT Education - 01/20/21 1314  Education Details information on labor; information on different places to get SI belts at consignment or look at [employer]) Educated Patient    Methods Explanation    Comprehension Verbalized understanding            PT Short Term Goals - 01/13/21 0930      PT SHORT TERM GOAL #1   Title independent with initial HEP for pain relief and stretches    Time 4    Period Weeks    Status Achieved             PT Long Term Goals - 01/17/21 0847      PT LONG TERM GOAL #1   Title independent with advanced HEP    Time 12    Period Weeks    Status On-going      PT LONG TERM GOAL #2   Title able to move in bed and get in and out with pain level </= 4/5 using correct body mechanics    Baseline pain level 7-8/10    Time 12    Period Weeks    Status On-going      PT LONG TERM GOAL #3   Title able to stand erect with hip extension at 0 degrees instead of -20 degrees due to increased spinal mobility    Baseline stands with 20 degress of hip flexion; able to stand upright better.     Time 12    Period Weeks    Status On-going      PT LONG TERM GOAL #4   Title understand ways to give birth to reduce the strain on the lumbar and pelvic region    Time 61    Period Weeks    Status On-going                 Plan - 01/20/21 1315    Clinical Impression Statement Patient pelvis was in correct alignment. Patient has less fascial restrictions in the lumbar area. She has trigger points in the hip rotators. Patient is looking into other options for a SI belt. Patient has pain in the pelvic area with bowel movements. Patient still walks very stiff legged. Patient will benefit from skilled therapy to improve mobility and reduce pain as her pregnancy progresses.    Personal Factors and Comorbidities Fitness;Past/Current Experience;Profession;Comorbidity 1    Comorbidities presently pregnant    Examination-Activity Limitations Bed Mobility;Bend;Lift;Stand;Squat;Stairs;Sit;Caring for Others;Carry;Locomotion Level;Transfers    Examination-Participation Restrictions Meal Prep;Cleaning;Occupation;Driving;Shop;Laundry    Stability/Clinical Decision Making Evolving/Moderate complexity    Rehab Potential Good    PT Frequency 2x / week    PT Duration 12 weeks    PT Treatment/Interventions ADLs/Self Care Home Management;Cryotherapy;Moist Heat;Neuromuscular re-education;Therapeutic exercise;Therapeutic activities;Functional mobility training;Patient/family education;Manual techniques;Passive range of motion;Joint Manipulations;Spinal Manipulations;Taping    PT Next Visit Plan toileting technique, breathing technique to reduce pelvic pain. see if she has gotten an SI belt, measure hip extension    PT Home Exercise Plan Access Code: [email or web address]    Consulted and Agree with Plan of Care Patient           Patient will benefit from skilled therapeutic intervention in order to improve the following deficits and impairments:  Decreased coordination,Decreased range of motion,Increased fascial  restricitons,Decreased endurance,Increased muscle spasms,Decreased activity tolerance,Pain,Improper body mechanics,Decreased strength,Decreased mobility  Visit Diagnosis: Muscle weakness (generalized)  Acute bilateral low back pain without sciatica  Pelvic pain     Problem List Patient Active Problem List   Diagnosis Date  Noted  . [redacted] weeks gestation of pregnancy 01/20/2021  . Pyelonephritis affecting pregnancy, antepartum 01/04/2021  . Candidal skin infection 11/23/2020  . Pelvic pain affecting pregnancy in second trimester, antepartum 11/23/2020  . Supervision of normal pregnancy, antepartum 09/29/2020  . History of preterm delivery 09/15/2019  . Low grade squamous intraepith lesion on cytologic smear cervix (lgsil) 02/26/2019  . Gastric ulcer 10/15/2013  . Major depression 10/15/2013  . PTSD (post-traumatic stress disorder) 10/15/2013    Eulis Foster, PT 01/20/21 1:22 PM   [facility] [address] [place], [place], [address] Phone: [phone]   Fax:  [phone]  Name: Danielle Carter MRN: [id number] Date of Birth: [birth year]

## 2021-01-20 NOTE — Progress Notes (Signed)
ROB/GTT/TDAP.  TDAP given RD, tolerated well.  PHQ-9=1

## 2021-01-20 NOTE — Patient Instructions (Signed)
Oral Glucose Tolerance Test During Pregnancy Why am I having this test? The oral glucose tolerance test (OGTT) is done to check how your body processes blood sugar (glucose). This is one of several tests used to diagnose diabetes that develops during pregnancy (gestational diabetes mellitus). Gestational diabetes is a short-term form of diabetes that some women develop while they are pregnant. It usually occurs during the second trimester of pregnancy and goes away after delivery. Testing, or screening, for gestational diabetes usually occurs at weeks 24-28 of pregnancy. You may have the OGTT test after having a 1-hour glucose screening test if the results from that test indicate that you may have gestational diabetes. This test may also be needed if:  You have a history of gestational diabetes.  There is a history of giving birth to very large babies or of losing pregnancies (having stillbirths).  You have signs and symptoms of diabetes, such as: ? Changes in your eyesight. ? Tingling or numbness in your hands or feet. ? Changes in hunger, thirst, and urination, and these are not explained by your pregnancy. What is being tested? This test measures the amount of glucose in your blood at different times during a period of 3 hours. This shows how well your body can process glucose. What kind of sample is taken? Blood samples are required for this test. They are usually collected by inserting a needle into a blood vessel.   How do I prepare for this test?  For 3 days before your test, eat normally. Have plenty of carbohydrate-rich foods.  Follow instructions from your health care provider about: ? Eating or drinking restrictions on the day of the test. You may be asked not to eat or drink anything other than water (to fast) starting 8-10 hours before the test. ? Changing or stopping your regular medicines. Some medicines may interfere with this test. Tell a health care provider about:  All  medicines you are taking, including vitamins, herbs, eye drops, creams, and over-the-counter medicines.  Any blood disorders you have.  Any surgeries you have had.  Any medical conditions you have. What happens during the test? First, your blood glucose will be measured. This is referred to as your fasting blood glucose because you fasted before the test. Then, you will drink a glucose solution that contains a certain amount of glucose. Your blood glucose will be measured again 1, 2, and 3 hours after you drink the solution. This test takes about 3 hours to complete. You will need to stay at the testing location during this time. During the testing period:  Do not eat or drink anything other than the glucose solution.  Do not exercise.  Do not use any products that contain nicotine or tobacco, such as cigarettes, e-cigarettes, and chewing tobacco. These can affect your test results. If you need help quitting, ask your health care provider. The testing procedure may vary among health care providers and hospitals. How are the results reported? Your results will be reported as milligrams of glucose per deciliter of blood (mg/dL) or millimoles per liter (mmol/L). There is more than one source for screening and diagnosis reference values used to diagnose gestational diabetes. Your health care provider will compare your results to normal values that were established after testing a large group of people (reference values). Reference values may vary among labs and hospitals. For this test (Carpenter-Coustan), reference values are:  Fasting: 95 mg/dL (5.3 mmol/L).  1 hour: 180 mg/dL (10.0 mmol/L).  2 hour:   155 mg/dL (8.6 mmol/L).  3 hour: 140 mg/dL (7.8 mmol/L). What do the results mean? Results below the reference values are considered normal. If two or more of your blood glucose levels are at or above the reference values, you may be diagnosed with gestational diabetes. If only one level is  high, your health care provider may suggest repeat testing or other tests to confirm a diagnosis. Talk with your health care provider about what your results mean. Questions to ask your health care provider Ask your health care provider, or the department that is doing the test:  When will my results be ready?  How will I get my results?  What are my treatment options?  What other tests do I need?  What are my next steps? Summary  The oral glucose tolerance test (OGTT) is one of several tests used to diagnose diabetes that develops during pregnancy (gestational diabetes mellitus). Gestational diabetes is a short-term form of diabetes that some women develop while they are pregnant.  You may have the OGTT test after having a 1-hour glucose screening test if the results from that test show that you may have gestational diabetes. You may also have this test if you have any symptoms or risk factors for this type of diabetes.  Talk with your health care provider about what your results mean. This information is not intended to replace advice given to you by your health care provider. Make sure you discuss any questions you have with your health care provider. Document Revised: 02/26/2020 Document Reviewed: 02/26/2020 Elsevier Patient Education  2021 Elsevier Inc.  

## 2021-01-21 LAB — RPR: RPR Ser Ql: NONREACTIVE

## 2021-01-21 LAB — GLUCOSE TOLERANCE, 2 HOURS W/ 1HR
Glucose, 1 hour: 60 mg/dL — ABNORMAL LOW (ref 65–179)
Glucose, 2 hour: 70 mg/dL (ref 65–152)
Glucose, Fasting: 70 mg/dL (ref 65–91)

## 2021-01-21 LAB — CBC
Hematocrit: 33.4 % — ABNORMAL LOW (ref 34.0–46.6)
Hemoglobin: 11 g/dL — ABNORMAL LOW (ref 11.1–15.9)
MCH: 28.4 pg (ref 26.6–33.0)
MCHC: 32.9 g/dL (ref 31.5–35.7)
MCV: 86 fL (ref 79–97)
Platelets: 377 10*3/uL (ref 150–450)
RBC: 3.87 x10E6/uL (ref 3.77–5.28)
RDW: 13.2 % (ref 11.7–15.4)
WBC: 9.1 10*3/uL (ref 3.4–10.8)

## 2021-01-21 LAB — HIV ANTIBODY (ROUTINE TESTING W REFLEX): HIV Screen 4th Generation wRfx: NONREACTIVE

## 2021-01-26 ENCOUNTER — Encounter: Payer: Self-pay | Admitting: Physical Therapy

## 2021-01-26 ENCOUNTER — Other Ambulatory Visit: Payer: Self-pay

## 2021-01-26 ENCOUNTER — Ambulatory Visit: Payer: Medicaid Other | Admitting: Physical Therapy

## 2021-01-26 DIAGNOSIS — M6281 Muscle weakness (generalized): Secondary | ICD-10-CM | POA: Diagnosis not present

## 2021-01-26 DIAGNOSIS — R102 Pelvic and perineal pain: Secondary | ICD-10-CM

## 2021-01-26 DIAGNOSIS — M545 Low back pain, unspecified: Secondary | ICD-10-CM

## 2021-01-26 NOTE — Therapy (Signed)
[facility] [address] [place], [place], [address] Phone: [phone]   Fax:  [phone]  Physical Therapy Treatment  Patient Details  Name: Danielle Carter MRN: [id number] Date of Birth: 03/26/1996 Referring Provider (PT): Arbie Cookey   Encounter Date: 01/26/2021   PT End of Session - 01/26/21 1059    Visit Number 6    Date for PT Re-Evaluation 03/28/21    Authorization Type [employer]    Authorization Time Period 4/4-6/27    Authorization - Visit Number 6    Authorization - Number of Visits 24    PT Start Time 1015    PT Stop Time 1055    PT Time Calculation (min) 40 min    Activity Tolerance Patient tolerated treatment well;Patient limited by pain;No increased pain    Behavior During Therapy WFL for tasks assessed/performed           Past Medical History:  Diagnosis Date  . Anemia   . Anxiety   . Depression    going well  . Headache    'occular'  . Infection    UTI  . PTSD (post-traumatic stress disorder)   . Vaginal Pap smear, abnormal    ok since    Past Surgical History:  Procedure Laterality Date  . DRUG INDUCED ENDOSCOPY      There were no vitals filed for this visit.   Subjective Assessment - 01/26/21 1024    Subjective I feel like I'm going to die beacuse of the pelvic pain. it feels like the longer I hold a single position the less I am able to move from those positions. I feellike I'm going to fall apart. Groin pain is ~20% better. Kegels have helped. Stool is loose and no straining required.    Limitations Standing;Walking;Sitting    Patient Stated Goals reduce pain and function    Currently in Pain? Yes    Pain Score 4     Pain Location Pelvis    Pain Orientation Left;Right    Pain Descriptors / Indicators Stabbing    Pain Type Acute pain    Pain Onset More than a month ago    Pain Frequency Constant    Aggravating Factors  any mobility    Pain Relieving Factors  no relief    Pain Score 8    Pain Location Hip   R and L   Pain Descriptors / Indicators Dull    Pain Frequency Constant   worse with movement, tolerable with rest             [facility] PT Assessment - 01/26/21 0001      AROM   AROM Assessment Site Hip;Lumbar    Right/Left Hip Right;Left    Right Hip Extension 5    Left Hip Extension 5    Lumbar Flexion R ilium anteriorly rotated                         [facility] Adult PT Treatment/Exercise - 01/26/21 0001      Exercises   Exercises Other Exercises    Other Exercises  R iliacus pullback in sidelying with therapist assist.      Lumbar Exercises: Supine   Glut Set Other (comment)   with assist in supine for assist in femoral head posteriorly   Other Supine Lumbar Exercises supine IR and ER      Knee/Hip Exercises: Sidelying   Other Sidelying Knee/Hip Exercises  sidely right iliacus pull back with tactile cuest to isloate the movement and monitor for pain      Manual Therapy   Manual Therapy Soft tissue mobilization    Joint Mobilization distraction of Rt hip in closed position,    Soft tissue mobilization manual mobilization on Rt quadratus, iliopsoas in sidelying and release of tissue suprapubicly.  noted tederness with touch.    Muscle Energy Technique to Rt ilium in sidely                    PT Short Term Goals - 01/13/21 0930      PT SHORT TERM GOAL #1   Title independent with initial HEP for pain relief and stretches    Time 4    Period Weeks    Status Achieved             PT Long Term Goals - 01/17/21 0847      PT LONG TERM GOAL #1   Title independent with advanced HEP    Time 12    Period Weeks    Status On-going      PT LONG TERM GOAL #2   Title able to move in bed and get in and out with pain level </= 4/5 using correct body mechanics    Baseline pain level 7-8/10    Time 12    Period Weeks    Status On-going      PT LONG TERM GOAL #3   Title able to stand erect with hip  extension at 0 degrees instead of -20 degrees due to increased spinal mobility    Baseline stands with 20 degress of hip flexion; able to stand upright better.    Time 12    Period Weeks    Status On-going      PT LONG TERM GOAL #4   Title understand ways to give birth to reduce the strain on the lumbar and pelvic region    Time 54    Period Weeks    Status On-going                 Plan - 01/26/21 1154    Clinical Impression Statement Bilateral hip extension has increased to 10 degrees. Patient came in walking stiff legged but after treatment she was able to flex her hips and knees. Patiet pain level decreased to 4/10 after manual work. Patient has increased tenderness of the suprapubic area and right quadratus. Patient bought a SI belt but waiting for it to come  in. Patient has to do her HEP slowly and sometimes is not able to do them due to pain. Pelvis in correct alignment after therapy. She has difficulty with right iliacus pullbacks and needed tactile cues to perform correctly. Patient will benefit from skilled therapy to improve mobility and reduce pain as her pregnancy progresses.    Personal Factors and Comorbidities Fitness;Past/Current Experience;Profession;Comorbidity 1    Comorbidities presently pregnant    Examination-Activity Limitations Bed Mobility;Bend;Lift;Stand;Squat;Stairs;Sit;Caring for Others;Carry;Locomotion Level;Transfers    Examination-Participation Restrictions Meal Prep;Cleaning;Occupation;Driving;Shop;Laundry    Stability/Clinical Decision Making Evolving/Moderate complexity    Rehab Potential Good    PT Frequency 2x / week    PT Duration 12 weeks    PT Treatment/Interventions ADLs/Self Care Home Management;Cryotherapy;Moist Heat;Neuromuscular re-education;Therapeutic exercise;Therapeutic activities;Functional mobility training;Patient/family education;Manual techniques;Passive range of motion;Joint Manipulations;Spinal Manipulations;Taping    PT Next  Visit Plan see if pelvis in correct alignemnt, see if she has the SI belt, work on gluteal engagement,  hamstring strength in sitting, hip adduction with a ball    PT Home Exercise Plan Access Code: [email or web address]    Consulted and Agree with Plan of Care Patient           Patient will benefit from skilled therapeutic intervention in order to improve the following deficits and impairments:  Decreased coordination,Decreased range of motion,Increased fascial restricitons,Decreased endurance,Increased muscle spasms,Decreased activity tolerance,Pain,Improper body mechanics,Decreased strength,Decreased mobility  Visit Diagnosis: Muscle weakness (generalized)  Acute bilateral low back pain without sciatica  Pelvic pain     Problem List Patient Active Problem List   Diagnosis Date Noted  . [redacted] weeks gestation of pregnancy 01/20/2021  . Pyelonephritis affecting pregnancy, antepartum 01/04/2021  . Candidal skin infection 11/23/2020  . Pelvic pain affecting pregnancy in second trimester, antepartum 11/23/2020  . Supervision of normal pregnancy, antepartum 09/29/2020  . History of preterm delivery 09/15/2019  . Low grade squamous intraepith lesion on cytologic smear cervix (lgsil) 02/26/2019  . Gastric ulcer 10/15/2013  . Major depression 10/15/2013  . PTSD (post-traumatic stress disorder) 10/15/2013    Eulis Foster, PT 01/26/21 12:00 PM   [facility] [address], [address] [place], [place], [address] Phone: [phone]   Fax:  [phone]  Name: Danielle Carter MRN: [id number] Date of Birth: 10/05/1995

## 2021-01-31 ENCOUNTER — Inpatient Hospital Stay (HOSPITAL_COMMUNITY)
Admission: AD | Admit: 2021-01-31 | Discharge: 2021-01-31 | Disposition: A | Discharge status: Home / Self Care | Payer: Medicaid Other | Attending: Family Medicine | Admitting: Family Medicine

## 2021-01-31 ENCOUNTER — Other Ambulatory Visit: Payer: Self-pay

## 2021-01-31 ENCOUNTER — Encounter (HOSPITAL_COMMUNITY): Payer: Self-pay | Admitting: Obstetrics & Gynecology

## 2021-01-31 ENCOUNTER — Other Ambulatory Visit: Payer: Self-pay | Admitting: Advanced Practice Midwife

## 2021-01-31 ENCOUNTER — Telehealth: Payer: Self-pay

## 2021-01-31 ENCOUNTER — Encounter: Payer: Medicaid Other | Admitting: Physical Therapy

## 2021-01-31 DIAGNOSIS — Z3A29 29 weeks gestation of pregnancy: Secondary | ICD-10-CM | POA: Diagnosis not present

## 2021-01-31 DIAGNOSIS — O09213 Supervision of pregnancy with history of pre-term labor, third trimester: Secondary | ICD-10-CM

## 2021-01-31 DIAGNOSIS — O4703 False labor before 37 completed weeks of gestation, third trimester: Secondary | ICD-10-CM

## 2021-01-31 DIAGNOSIS — O99333 Smoking (tobacco) complicating pregnancy, third trimester: Secondary | ICD-10-CM | POA: Diagnosis not present

## 2021-01-31 DIAGNOSIS — F17210 Nicotine dependence, cigarettes, uncomplicated: Secondary | ICD-10-CM | POA: Insufficient documentation

## 2021-01-31 DIAGNOSIS — Z3490 Encounter for supervision of normal pregnancy, unspecified, unspecified trimester: Secondary | ICD-10-CM

## 2021-01-31 LAB — URINALYSIS, ROUTINE W REFLEX MICROSCOPIC
Bilirubin Urine: NEGATIVE
Glucose, UA: NEGATIVE mg/dL
Hgb urine dipstick: NEGATIVE
Ketones, ur: NEGATIVE mg/dL
Leukocytes,Ua: NEGATIVE
Nitrite: NEGATIVE
Protein, ur: NEGATIVE mg/dL
Specific Gravity, Urine: 1.019 (ref 1.005–1.030)
pH: 5 (ref 5.0–8.0)

## 2021-01-31 LAB — WET PREP, GENITAL
Clue Cells Wet Prep HPF POC: NONE SEEN
Sperm: NONE SEEN
Trich, Wet Prep: NONE SEEN
Yeast Wet Prep HPF POC: NONE SEEN

## 2021-01-31 MED ORDER — NIFEDIPINE 10 MG PO CAPS
10.0000 mg | ORAL_CAPSULE | Freq: Once | ORAL | Status: AC
  Administered 2021-01-31: 10 mg via ORAL
  Filled 2021-01-31: qty 1

## 2021-01-31 MED ORDER — NIFEDIPINE ER OSMOTIC RELEASE 30 MG PO TB24: 30.0000 mg | ORAL_TABLET | Freq: Every day | ORAL | 2 refills | Status: DC

## 2021-01-31 NOTE — MAU Note (Signed)
Pt reports irregular contractions for 1 week. Denies VB or LOF. Thinks mucous plug has come out. +FM. States pain is worse today but doesn't think cntrx are more regular.

## 2021-01-31 NOTE — Discharge Instructions (Signed)
Preterm Labor Pregnancy normally lasts 39-41 weeks. Preterm labor is when labor starts before you have been pregnant for 37 weeks. Babies who are born too early may have problems with blood sugar, body temperature, heart, and breathing. These problems may be very serious in babies who are born before 34 weeks of pregnancy. What are the causes? The cause of this condition is not known. What increases the risk? You are more likely to have preterm labor if:  You have medical problems, now or in the past.  You have problems now or in your past pregnancies.  You have lifestyle problems. Medical history  You have problems of the womb (uterus).  You have an infection, including infections you get from sex.  You have problems that do not go away, such as: ? Blood clots. ? High blood pressure. ? High blood sugar.  You have low body weight or too much body weight. Present and past pregnancies  You have had preterm labor before.  You are pregnant with two babies or more.  You have a condition in which the placenta covers your cervix.  You waited less than 6 months between giving birth and becoming pregnant again.  Your unborn baby has some problems.  You have bleeding from your vagina.  You became pregnant by a method called IVF. Lifestyle  You smoke.  You drink alcohol.  You use drugs.  You have stress.  You have abuse in your home.  You come in contact with chemicals that harm the body (pollutants). Other factors  You are younger than 17 years or older than 35 years. What are the signs or symptoms? Symptoms of this condition include:  Cramps. The cramps may feel like cramps from a period.  You may have watery poop (diarrhea).  Pain in the belly (abdomen).  Pain in the lower back.  Regular contractions. It may feel like your belly is getting tighter.  Pressure in the lower belly.  More fluid leaking from the vagina. The fluid may be watery or  bloody.  Water breaking. How is this treated? Treatment for this condition depends on your health, the health of your baby, and how old your pregnancy is. It may include:  Taking medicines, such as: ? Hormone medicines. ? Medicines to stop contractions. ? Medicines to help mature the baby's lungs. ? Medicines to prevent your baby from getting cerebral palsy.  Bed rest. If the labor happens before 34 weeks of pregnancy, you may need to stay in the hospital.  Delivering the baby. Follow these instructions at home:  Do not use any products that contain nicotine or tobacco, such as cigarettes, e-cigarettes, and chewing tobacco. If you need help quitting, ask your doctor.  Do not drink alcohol.  Take over-the-counter and prescription medicines only as told by your doctor.  Rest as told by your doctor.  Return to your activities as told by your doctor. Ask your doctor what activities are safe for you.  Keep all follow-up visits as told by your doctor. This is important.   How is this prevented? To have a healthy pregnancy:  Do not use street drugs.  Do not use any medicines unless you ask your doctor if they are safe for you.  Talk with your doctor before taking any herbal supplements.  Make sure you gain enough weight.  Watch for infection. If you think you might have an infection, get it checked right away. Symptoms of infection may include: ? Fever. ? Vaginal discharge. ?   Pain or burning when you pee. ? Needing to pee urgently. ? Needing to pee often. ? Peeing small amounts often. ? Blood in your pee. ? Pee that smells bad or unusual.  Tell your doctor if you have gone into preterm labor before. Contact a doctor if:  You think you are going into preterm labor.  You have symptoms of preterm labor.  You have symptoms of infection. Get help right away if:  You are having painful contractions every 5 minutes or less.  Your water breaks. Summary  Preterm labor  is labor that starts before you reach 37 weeks of pregnancy.  Your baby may have problems if delivered early.  The cause of preterm labor is not known. Having problems of the womb (uterus), an infection, or bleeding during pregnancy increases the risk.  Contact a doctor if you have signs or symptoms of preterm labor. This information is not intended to replace advice given to you by your health care provider. Make sure you discuss any questions you have with your health care provider. Document Revised: 10/21/2019 Document Reviewed: 10/21/2019 Elsevier Patient Education  2021 [employer] Inc[employer]  

## 2021-01-31 NOTE — Telephone Encounter (Signed)
Pt called stating that she has been having irregular contractions for the past week. States they are coming about every 5-10 minutes lasting about 30 seconds to 1 minute. She denies any leaking fluid. States baby is still moving well when she is not having contractions and can concentrate on movements. States she has tried position changes, laying down, resting, eating, and drinking to try and stop the contractions or slow then down but none of that will help. Pt has a history of preterm delivery at 36 weeks. I spoke with Dr. Clearance Coots and he agrees that patient should be seen at [facility] for evaluation of contractions to r/o possible preterm labor given patient's history. I advised patient to go to [facility] for evaluation. Pt agreed and verbalized understanding.

## 2021-01-31 NOTE — [facility] Provider Note (Signed)
History     CSN: [id number]  Arrival date and time: 01/31/21 1408   Event Date/Time   First Provider Initiated Contact with Patient 01/31/21 1505      No chief complaint on file.  HPI This is a 25yo [id number] at [redacted]w[redacted]d with pregnancy complicated by previous preterm delivery at 69 weeks, then a subsequent term delivery. She presents with contractions, which she had irregularly over the past week. The contractions have become more painful, but not more regular. She is on macrobid for history of pyelonephritis this pregnancy.  Did have sex within last 80 hours.  OB History    Gravida  3   Para  2   Term  1   Preterm  1   AB  0   Living  2     SAB      IAB      Ectopic      Multiple  0   Live Births  2           Past Medical History:  Diagnosis Date  . Anemia   . Anxiety   . Depression    going well  . Headache    'occular'  . Infection    UTI  . PTSD (post-traumatic stress disorder)   . Vaginal Pap smear, abnormal    ok since    Past Surgical History:  Procedure Laterality Date  . DRUG INDUCED ENDOSCOPY      Family History  Problem Relation Age of Onset  . Fibromyalgia Mother   . Seizures Mother   . Pancreatitis Father   . Graves' disease Father   . Depression Father     Social History   Tobacco Use  . Smoking status: Current Every Day Smoker    Packs/day: 0.50    Types: Cigarettes    Last attempt to quit: 12/10/2018    Years since quitting: 2.1  . Smokeless tobacco: Never Used  Vaping Use  . Vaping Use: Never used  Substance Use Topics  . Alcohol use: Never  . Drug use: Never    Allergies:  Allergies  Allergen Reactions  . Abilify [Aripiprazole] Other (See Comments)    Massive mood swings  . Nickel Other (See Comments)    Turns green  . Pristiq [Desvenlafaxine] Other (See Comments)    Massive headaches  . Rocephin [Ceftriaxone Sodium In Dextrose] Hives  . Latex Rash    Medications Prior to Admission  Medication Sig  Dispense Refill Last Dose  . acetaminophen (TYLENOL) 500 MG tablet Take 1,000 mg by mouth every 6 (six) hours as needed for mild pain or headache.   01/30/2021 at Unknown time  . clotrimazole-betamethasone (LOTRISONE) cream Apply 1 application topically 2 (two) times daily. 30 g 0 Past Month at Unknown time  . cyclobenzaprine (FLEXERIL) 5 MG tablet Take 1-2 tablets (5-10 mg total) by mouth 3 (three) times daily as needed for muscle spasms. 30 tablet 0 Past Month at Unknown time  . nitrofurantoin, macrocrystal-monohydrate, (MACROBID) 100 MG capsule Take 1 capsule (100 mg total) by mouth at bedtime. To start in 2 wks- after finishing Bactrim DS 30 capsule 3 01/30/2021 at Unknown time  . Prenatal Vit-Fe Fumarate-FA (PRENATAL MULTIVITAMIN) TABS tablet Take 1 tablet by mouth daily at 12 noon.   01/30/2021 at Unknown time  . Blood Pressure Monitor KIT 1 each by Does not apply route daily. 1 each 0     Review of Systems  All other systems reviewed and are negative.  Physical Exam   Blood pressure 114/61, pulse 98, temperature 98.1 F (36.7 C), temperature source Oral, resp. rate 18, height $RemoveBe'5\' 4"'tUeOWUQXh$  (1.626 m), weight 61.9 kg, last menstrual period 07/09/2020, unknown if currently breastfeeding.  Physical Exam Vitals reviewed.  Constitutional:      Appearance: Normal appearance.  HENT:     Head: Normocephalic and atraumatic.  Cardiovascular:     Rate and Rhythm: Normal rate and regular rhythm.     Pulses: Normal pulses.     Heart sounds: Normal heart sounds.  Pulmonary:     Effort: Pulmonary effort is normal.     Breath sounds: Normal breath sounds.  Skin:    General: Skin is warm and dry.     Capillary Refill: Capillary refill takes less than 2 seconds.  Neurological:     General: No focal deficit present.     Mental Status: She is alert.  Psychiatric:        Mood and Affect: Mood normal.        Behavior: Behavior normal.        Thought Content: Thought content normal.        Judgment:  Judgment normal.    Dilation: Closed Exam by:: Dr Nehemiah Settle  Results for orders placed or performed during the hospital encounter of 01/31/21 (from the past 24 hour(s))  Urinalysis, Routine w reflex microscopic Urine, Clean Catch     Status: None   Collection Time: 01/31/21  2:44 PM  Result Value Ref Range   Color, Urine YELLOW YELLOW   APPearance CLEAR CLEAR   Specific Gravity, Urine 1.019 1.005 - 1.030   pH 5.0 5.0 - 8.0   Glucose, UA NEGATIVE NEGATIVE mg/dL   Hgb urine dipstick NEGATIVE NEGATIVE   Bilirubin Urine NEGATIVE NEGATIVE   Ketones, ur NEGATIVE NEGATIVE mg/dL   Protein, ur NEGATIVE NEGATIVE mg/dL   Nitrite NEGATIVE NEGATIVE   Leukocytes,Ua NEGATIVE NEGATIVE  Wet prep, genital     Status: Abnormal   Collection Time: 01/31/21  3:16 PM   Specimen: Vaginal; Genital  Result Value Ref Range   Yeast Wet Prep HPF POC NONE SEEN NONE SEEN   Trich, Wet Prep NONE SEEN NONE SEEN   Clue Cells Wet Prep HPF POC NONE SEEN NONE SEEN   WBC, Wet Prep HPF POC MANY (A) NONE SEEN   Sperm NONE SEEN      MAU Course  Procedures  MDM Procardia $RemoveBefor'10mg'RamBAVaFLlsf$  given with improvement of symptoms.  Assessment and Plan   1. [redacted] weeks gestation of pregnancy   2. Encounter for supervision of normal pregnancy, antepartum, unspecified gravidity   3. Threatened preterm labor, third trimester    WIll send with procardia XR for symptomatic contractions. F/u in office. Return precautions given.  Truett Mainland 01/31/2021, 3:05 PM

## 2021-02-01 LAB — GC/CHLAMYDIA PROBE AMP (~~LOC~~) NOT AT ARMC
Chlamydia: NEGATIVE
Comment: NEGATIVE
Comment: NORMAL
Neisseria Gonorrhea: NEGATIVE

## 2021-02-03 ENCOUNTER — Telehealth (INDEPENDENT_AMBULATORY_CARE_PROVIDER_SITE_OTHER): Payer: Medicaid Other | Admitting: Nurse Practitioner

## 2021-02-03 VITALS — BP 110/55 | HR 64

## 2021-02-03 DIAGNOSIS — O09213 Supervision of pregnancy with history of pre-term labor, third trimester: Secondary | ICD-10-CM

## 2021-02-03 DIAGNOSIS — O2303 Infections of kidney in pregnancy, third trimester: Secondary | ICD-10-CM

## 2021-02-03 DIAGNOSIS — N879 Dysplasia of cervix uteri, unspecified: Secondary | ICD-10-CM

## 2021-02-03 DIAGNOSIS — O4703 False labor before 37 completed weeks of gestation, third trimester: Secondary | ICD-10-CM

## 2021-02-03 DIAGNOSIS — O3443 Maternal care for other abnormalities of cervix, third trimester: Secondary | ICD-10-CM

## 2021-02-03 DIAGNOSIS — Z3A29 29 weeks gestation of pregnancy: Secondary | ICD-10-CM

## 2021-02-03 DIAGNOSIS — Z3490 Encounter for supervision of normal pregnancy, unspecified, unspecified trimester: Secondary | ICD-10-CM

## 2021-02-03 MED ORDER — CYCLOBENZAPRINE HCL 5 MG PO TABS: 5.0000 mg | ORAL_TABLET | Freq: Three times a day (TID) | ORAL | Status: DC | PRN

## 2021-02-03 NOTE — Progress Notes (Signed)
OBSTETRICS PRENATAL VIRTUAL VISIT ENCOUNTER NOTE  [facility]   Patient location: Home  I connected with [name] on 02/03/21 at  2:20 PM EDT by MyChart Video Encounter and verified that I am speaking with the correct person using two identifiers. I discussed the limitations, risks, security and privacy concerns of performing an evaluation and management service virtually and the availability of in person appointments. I also discussed with the patient that there may be a patient responsible charge related to this service. The patient expressed understanding and agreed to proceed. Subjective:  Danielle Carter is a 25 y.o. [id number] at [redacted]w[redacted]d being seen today for ongoing prenatal care.  She is currently monitored for the following issues for this low-risk pregnancy and has Low grade squamous intraepith lesion on cytologic smear cervix (lgsil); Gastric ulcer; Major depression; PTSD (post-traumatic stress disorder); History of preterm delivery; Supervision of normal pregnancy, antepartum; Candidal skin infection; Pelvic pain affecting pregnancy in second trimester, antepartum; Pyelonephritis affecting pregnancy, antepartum; and [redacted] weeks gestation of pregnancy on their problem list.  Patient reports continued contractions despite taking Procardia daily.  Contractions: Irregular. Vag. Bleeding: None.  Movement: Present. Denies any leaking of fluid.   The following portions of the patient's history were reviewed and updated as appropriate: allergies, current medications, past family history, past medical history, past social history, past surgical history and problem list.   Objective:   Vitals:   02/03/21 1409  BP: (!) 110/55  Pulse: 64    Fetal Status:     Movement: Present     General:  Alert, oriented and cooperative. Patient is in no acute distress.  Respiratory: Normal respiratory effort, no problems with respiration noted  Mental Status:  Normal mood and affect. Normal behavior. Normal judgment and thought content.  Rest of physical exam deferred due to type of encounter  Imaging: No results found.  Assessment and Plan:  Pregnancy: G3P1102 at [redacted]w[redacted]d 1. Encounter for supervision of normal pregnancy, antepartum, unspecified gravidity Represcribed Flexeril 5 mg for her to take for pain Is seeing pelvic PT and doing exercises between appointments Taking macrobid for suppression due to pyelo  2. Preterm uterine contractions in third trimester, antepartum Was seen in [facility]U this week - note reviewed Still having contractions that are painful to her Is possible that this is normal however cannot fully rule out preterm labor without physical cervix check - will schedule in the office on Friday morning.  Preterm labor symptoms and general obstetric precautions including but not limited to vaginal bleeding, contractions, leaking of fluid and fetal movement were reviewed in detail with the patient. I discussed the assessment and treatment plan with the patient. The patient was provided an opportunity to ask questions and all were answered. The patient agreed with the plan and demonstrated an understanding of the instructions. The patient was advised to call back or seek an in-person office evaluation/go to [facility] at [facility] for any urgent or concerning symptoms. Please refer to After Visit Summary for other counseling recommendations.   I provided 8 minutes of face-to-face time during this encounter.  Return in 1 day (on 02/04/2021) for IN AM for Cervical exam and then in 2 weeks for ROB.  Future Appointments  Date Time Provider Department Center  02/04/2021 10:10 AM Marylene Land, CNM [facility] None  02/14/2021 10:15 AM Theressa Millard, PT [facility]  02/17/2021 11:15 AM Gerrit Heck, CNM [facility] None  02/21/2021 11:00  AM Theressa Millard, PT [facility]  03/03/2021 10:15 AM Theressa Millard, PT [facility]   03/10/2021 11:00 AM Theressa Millard, PT [facility]    Currie Paris, NP [facility] [employer], [facility]

## 2021-02-03 NOTE — Progress Notes (Signed)
Pt states she feels that she is still having ctx, taking Procardia.   Pt does not have any Flexeril refills, would like to know if she can refill.

## 2021-02-04 ENCOUNTER — Ambulatory Visit (INDEPENDENT_AMBULATORY_CARE_PROVIDER_SITE_OTHER): Payer: Medicaid Other | Admitting: Student

## 2021-02-04 ENCOUNTER — Other Ambulatory Visit: Payer: Self-pay

## 2021-02-04 VITALS — BP 102/61 | HR 67 | Wt 136.0 lb

## 2021-02-04 DIAGNOSIS — Z3483 Encounter for supervision of other normal pregnancy, third trimester: Secondary | ICD-10-CM

## 2021-02-04 DIAGNOSIS — Z3A30 30 weeks gestation of pregnancy: Secondary | ICD-10-CM

## 2021-02-04 MED ORDER — CYCLOBENZAPRINE HCL 5 MG PO TABS: 5.0000 mg | ORAL_TABLET | Freq: Three times a day (TID) | ORAL | 0 refills | Status: DC | PRN

## 2021-02-04 NOTE — Progress Notes (Signed)
Pt states increase in pelvic pain and leg pain today.

## 2021-02-04 NOTE — Progress Notes (Signed)
Danielle Carter, female   DOB: 01-25-1996, 25 y.o.   MRN: [id number]   PRENATAL VISIT NOTE  Subjective:  Danielle Carter is a 25 y.o. G3P1102 at [redacted]w[redacted]d being seen today for ongoing prenatal care.  She is currently monitored for the following issues for this low-risk pregnancy and has Low grade squamous intraepith lesion on cytologic smear cervix (lgsil); Gastric ulcer; Major depression; PTSD (post-traumatic stress disorder); History of preterm delivery; Supervision of normal pregnancy, antepartum; Candidal skin infection; Pelvic pain affecting pregnancy in second trimester, antepartum; Pyelonephritis affecting pregnancy, antepartum; and [redacted] weeks gestation of pregnancy on their problem list.  Danielle reports no complaints. She is here for cervical exam due to the fact that she reported contractions on her MyChart visit yesterday. \ Contractions: Irregular. Vag. Bleeding: None.  Movement: Present. Denies leaking of fluid.   The following portions of the Danielle's history were reviewed and updated as appropriate: allergies, current medications, past family history, past medical history, past social history, past surgical history and problem list.   Objective:   Vitals:   02/04/21 1015  BP: 102/61  Pulse: 67  Weight: 136 lb (61.7 kg)    Fetal Status: Fetal Heart Rate (bpm): 140 Fundal Height: 30 cm Movement: Present     General:  Alert, oriented and cooperative. Danielle is in no acute distress.  Skin: Skin is warm and dry. No rash noted.   Cardiovascular: Normal heart rate noted  Respiratory: Normal respiratory effort, no problems with respiration noted  Abdomen: Soft, gravid, appropriate for gestational age.  Pain/Pressure: Present     Pelvic: Cervical exam performed in the presence of a chaperone Dilation: Closed Effacement (%): Thick    Extremities: Normal range of motion.     Mental Status: Normal mood and affect. Normal behavior. Normal judgment and thought content.    Assessment and Plan:  Pregnancy: G3P1102 at [redacted]w[redacted]d 1. [redacted] weeks gestation of pregnancy   -Danielle doing well -reassured Danielle that cervix is thick and closed; no signs of labor. Reviewed when to come to [facility]U, 5-1-1 rule reviewed.  -keep next appt as schedueld   Preterm labor symptoms and general obstetric precautions including but not limited to vaginal bleeding, contractions, leaking of fluid and fetal movement were reviewed in detail with the Danielle. Please refer to After Visit Summary for other counseling recommendations.   No follow-ups on file.  Future Appointments  Date Time Provider Department Center  02/14/2021 10:15 AM Theressa Millard, PT [facility]  02/17/2021 11:15 AM Gerrit Heck, CNM [facility] None  02/21/2021 11:00 AM Theressa Millard, PT [facility]  03/03/2021 10:15 AM Theressa Millard, PT [facility]  03/10/2021 11:00 AM Theressa Millard, PT [facility]    Marylene Land, [place]

## 2021-02-14 ENCOUNTER — Ambulatory Visit: Payer: Medicaid Other | Attending: Advanced Practice Midwife | Admitting: Physical Therapy

## 2021-02-14 DIAGNOSIS — M545 Low back pain, unspecified: Secondary | ICD-10-CM | POA: Insufficient documentation

## 2021-02-14 DIAGNOSIS — M6281 Muscle weakness (generalized): Secondary | ICD-10-CM | POA: Insufficient documentation

## 2021-02-14 DIAGNOSIS — R102 Pelvic and perineal pain: Secondary | ICD-10-CM | POA: Insufficient documentation

## 2021-02-15 ENCOUNTER — Other Ambulatory Visit: Payer: Self-pay

## 2021-02-15 ENCOUNTER — Encounter: Payer: Medicaid Other | Attending: Advanced Practice Midwife | Admitting: Physical Therapy

## 2021-02-15 ENCOUNTER — Encounter: Payer: Self-pay | Admitting: Physical Therapy

## 2021-02-15 DIAGNOSIS — M545 Low back pain, unspecified: Secondary | ICD-10-CM | POA: Diagnosis not present

## 2021-02-15 DIAGNOSIS — M6281 Muscle weakness (generalized): Secondary | ICD-10-CM | POA: Insufficient documentation

## 2021-02-15 DIAGNOSIS — R102 Pelvic and perineal pain: Secondary | ICD-10-CM | POA: Diagnosis not present

## 2021-02-15 NOTE — Therapy (Signed)
[facility] [address], [address] [place], [place], [address] Phone: [phone]   Fax:  [phone]  Physical Therapy Treatment  Patient Details  Name: Danielle Carter MRN: [id number] Date of Birth: [birth year] Referring Provider (PT): Arbie Cookey   Encounter Date: 02/15/2021   PT End of Session - 02/15/21 0954    Visit Number 7    Date for PT Re-Evaluation 03/28/21    Authorization Type [employer]    Authorization Time Period 4/4-6/27    Authorization - Visit Number 7    Authorization - Number of Visits 24    PT Start Time 0945   cmae late   PT Stop Time 1030    PT Time Calculation (min) 45 min    Activity Tolerance Patient tolerated treatment well;Patient limited by pain;No increased pain    Behavior During Therapy WFL for tasks assessed/performed           Past Medical History:  Diagnosis Date  . Anemia   . Anxiety   . Depression    going well  . Headache    'occular'  . Infection    UTI  . PTSD (post-traumatic stress disorder)   . Vaginal Pap smear, abnormal    ok since    Past Surgical History:  Procedure Laterality Date  . DRUG INDUCED ENDOSCOPY      There were no vitals filed for this visit.   Subjective Assessment - 02/15/21 0948    Subjective I got my maternity belt. When I put it on but did not help. It helped my back muscles and the back is feeling better. I have a pusating feeling in the pubic region. She feels the muscle spasms.  On friday will be 32 weeks.    Limitations Standing;Walking;Sitting    Patient Stated Goals reduce pain and function    Currently in Pain? Yes    Pain Score 4     Pain Location Abdomen    Pain Orientation Right;Left    Pain Descriptors / Indicators Discomfort;Tightness    Pain Type Acute pain    Pain Onset More than a month ago    Pain Frequency Constant    Aggravating Factors  the baby moving; getting up out of bed    Pain Relieving Factors adjust     Multiple Pain Sites Yes    Pain Score 7    Pain Location Hip    Pain Descriptors / Indicators Dull    Pain Type Acute pain    Pain Onset More than a month ago    Pain Frequency Constant    Aggravating Factors  sitting,    Pain Relieving Factors laying down relieves one side    Pain Score 2    Pain Location Vagina    Pain Orientation Mid    Pain Descriptors / Indicators Radiating;Spasm    Pain Type Acute pain    Pain Onset More than a month ago    Pain Frequency Constant    Aggravating Factors  not sure    Pain Relieving Factors kegels, sitting in a warm bath                          Pelvic Floor Special Questions - 02/15/21 0001    External Palpation ishiocavernosus, perineal body and levator ani are tender    Pelvic Floor Internal Exam Patient confirmed identification and approved PT to assess pelvic floor and treatment  Exam Type Vaginal    Palpation tenderness located on the puborectalis, iliococcygeus, decreased movement of the coccyx bone,             [facility] Adult PT Treatment/Exercise - 02/15/21 0001      Manual Therapy   Manual Therapy Internal Pelvic Floor    Internal Pelvic Floor gentle distraction of the coccyx, manual mobilization of the puborectlis, ilococcygues, along the pubic bone and the ischial tuberosity while monitoring for pain level in right sidely                    PT Short Term Goals - 01/13/21 0930      PT SHORT TERM GOAL #1   Title independent with initial HEP for pain relief and stretches    Time 4    Period Weeks    Status Achieved             PT Long Term Goals - 02/15/21 1255      PT LONG TERM GOAL #1   Title independent with advanced HEP    Baseline continues to be educated    Time 12    Period Weeks    Status On-going      PT LONG TERM GOAL #2   Title able to move in bed and get in and out with pain level </= 4/5 using correct body mechanics    Baseline pain level 7-8/10    Time 12    Period  Weeks    Status On-going      PT LONG TERM GOAL #3   Title able to stand erect with hip extension at 0 degrees instead of -20 degrees due to increased spinal mobility    Baseline stands with 20 degress of hip flexion; able to stand upright better.    Time 12    Period Weeks    Status Achieved      PT LONG TERM GOAL #4   Title understand ways to give birth to reduce the strain on the lumbar and pelvic region    Time 3    Period Weeks    Status On-going                 Plan - 02/15/21 1251    Clinical Impression Statement Patient had many trigger points in her pelvic floor muslces. She had decreased movement of her coccyx. After the manual therapy she had less pain and able to walk with greater ease. Patient is having pain with her bowel movements. We went over diaphragmatic breathing today to relax her pelvic floor and help with bowel movements. Patient is using her SI belt but is not having significant pain relief with it. Patient will benefit from skilled therapy to to improve mobility and reduce pain as her pregnancy progresses.    Personal Factors and Comorbidities Fitness;Past/Current Experience;Profession;Comorbidity 1    Comorbidities presently pregnant    Examination-Activity Limitations Bed Mobility;Bend;Lift;Stand;Squat;Stairs;Sit;Caring for Others;Carry;Locomotion Level;Transfers    Examination-Participation Restrictions Meal Prep;Cleaning;Occupation;Driving;Shop;Laundry    Stability/Clinical Decision Making Evolving/Moderate complexity    Rehab Potential Good    PT Frequency 2x / week    PT Duration 12 weeks    PT Treatment/Interventions ADLs/Self Care Home Management;Cryotherapy;Moist Heat;Neuromuscular re-education;Therapeutic exercise;Therapeutic activities;Functional mobility training;Patient/family education;Manual techniques;Passive range of motion;Joint Manipulations;Spinal Manipulations;Taping    PT Next Visit Plan internal work to reduce trigger points,  toileting technique, correct breathing without contracting the pelvic floor to prepare for birth    PT Home Exercise Plan  Access Code: [email or web address]    Consulted and Agree with Plan of Care Patient           Patient will benefit from skilled therapeutic intervention in order to improve the following deficits and impairments:  Decreased coordination,Decreased range of motion,Increased fascial restricitons,Decreased endurance,Increased muscle spasms,Decreased activity tolerance,Pain,Improper body mechanics,Decreased strength,Decreased mobility  Visit Diagnosis: Muscle weakness (generalized)  Acute bilateral low back pain without sciatica  Pelvic pain     Problem List Patient Active Problem List   Diagnosis Date Noted  . [redacted] weeks gestation of pregnancy 01/20/2021  . Pyelonephritis affecting pregnancy, antepartum 01/04/2021  . Candidal skin infection 11/23/2020  . Pelvic pain affecting pregnancy in second trimester, antepartum 11/23/2020  . Supervision of normal pregnancy, antepartum 09/29/2020  . History of preterm delivery 09/15/2019  . Low grade squamous intraepith lesion on cytologic smear cervix (lgsil) 02/26/2019  . Gastric ulcer 10/15/2013  . Major depression 10/15/2013  . PTSD (post-traumatic stress disorder) 10/15/2013    Eulis Foster, PT 02/15/21 12:57 PM   [facility] [address] [place], [place], [address] Phone: [phone]   Fax:  [phone]  Name: MARYJO RAGON MRN: [id number] Date of Birth: [birth year]

## 2021-02-17 ENCOUNTER — Other Ambulatory Visit: Payer: Self-pay

## 2021-02-17 ENCOUNTER — Ambulatory Visit (INDEPENDENT_AMBULATORY_CARE_PROVIDER_SITE_OTHER): Payer: Medicaid Other

## 2021-02-17 VITALS — BP 111/68 | HR 96 | Wt 140.0 lb

## 2021-02-17 DIAGNOSIS — Z3490 Encounter for supervision of normal pregnancy, unspecified, unspecified trimester: Secondary | ICD-10-CM

## 2021-02-17 DIAGNOSIS — Z3A31 31 weeks gestation of pregnancy: Secondary | ICD-10-CM

## 2021-02-17 DIAGNOSIS — O368131 Decreased fetal movements, third trimester, fetus 1: Secondary | ICD-10-CM | POA: Diagnosis not present

## 2021-02-17 DIAGNOSIS — O368120 Decreased fetal movements, second trimester, not applicable or unspecified: Secondary | ICD-10-CM

## 2021-02-17 NOTE — Progress Notes (Signed)
LOW-RISK PREGNANCY OFFICE VISIT  Patient name: Danielle Carter MRN [id number]  Date of birth: 04-Oct-1995 Chief Complaint:   Routine Prenatal Visit  Subjective:   Danielle Carter is a 25 y.o. G36P1102 female at [redacted]w[redacted]d with an Estimated Date of Delivery: 04/15/21 being seen today for ongoing management of a low-risk pregnancy aeb has Low grade squamous intraepith lesion on cytologic smear cervix (lgsil); Gastric ulcer; Major depression; PTSD (post-traumatic stress disorder); History of preterm delivery; Supervision of normal pregnancy, antepartum; Candidal skin infection; Pelvic pain affecting pregnancy in second trimester, antepartum; Pyelonephritis affecting pregnancy, antepartum; and [redacted] weeks gestation of pregnancy on their problem list.  Patient presents today with decreased fetal movement and RUQ pain.  Patient states fetal movement today has been decreased.  She also reports onset of RUQ pain for the past 2 weeks that is intermittent and lasts ~2 minutes.  She states it is a sharp shooting pain and resolves without intervention.  Patient reports occasional abdominal cramping and contractions that is managed with procardia usage.  Patient denies vaginal concerns including abnormal discharge, leaking of fluid, and bleeding.  Contractions: Not present. Vag. Bleeding: None.  Movement: (!) Decreased.  Reviewed past medical,surgical, social, obstetrical and family history as well as problem list, medications and allergies.  Objective   Vitals:   02/17/21 1120  BP: 111/68  Pulse: 96  Weight: 140 lb (63.5 kg)  Body mass index is 24.03 kg/m.  Total Weight Gain:30 lb (13.6 kg)         Physical Examination:   General appearance: Well appearing, and in no distress  Mental status: Alert, oriented to person, place, and time  Skin: Warm & dry  Cardiovascular: Normal heart rate noted  Respiratory: Normal respiratory effort, no distress  Abdomen: Soft, gravid, nontender, AGA with Fundal Height: 31  cm  Pelvic: Cervical exam deferred           Extremities:    Fetal Status: Fetal Heart Rate (bpm): NST  Movement: (!) Decreased   No results found for this or any previous visit (from the past 24 hour(s)).  Assessment & Plan:  Low-risk pregnancy of a 25 y.o., [id number] at [redacted]w[redacted]d with an Estimated Date of Delivery: 04/15/21   1. Encounter for supervision of normal pregnancy, antepartum, unspecified gravidity -Anticipatory guidance for upcoming appts. -Patient to next appt in 2 weeks for an in-person visit.   2. [redacted] weeks gestation of pregnancy -Complaints addressed. -Informed that infant is sitting in RUQ as revealed during [place] assessment. *Discussed how fetal position and maternal movement will cause some discomfort in RUQ. *Instructed to monitor and report worsening of symptoms.   3. Decreased fetal movements in second trimester, single or unspecified fetus -NST performed and reactive despite occasional variable.  -Discussed fetal movement during pregnancy.  Informed that movement and strength of fetal movement may decrease, but timing should remain consistent, I.e. always moves in am after breakfast, etc. -Patient verbalized understanding and endorses movement, but states it is not as strong. -Instructed to continue procardia dosing and can increase to twice daily if needed. -Encouraged to notify office of any concerns and not wait.    Meds: No orders of the defined types were placed in this encounter.  Labs/procedures today:  Lab Orders  No laboratory test(s) ordered today     Reviewed: Preterm labor symptoms and general obstetric precautions including but not limited to vaginal bleeding, contractions, leaking of fluid and fetal movement were reviewed in detail with the patient.  All  questions were answered.  Follow-up: Return in about 2 weeks (around 03/03/2021) for LROB.  Orders Placed This Encounter  Procedures  . Fetal nonstress test   Cherre Robins MSN,  CNM 02/17/2021

## 2021-02-17 NOTE — Patient Instructions (Signed)
Fetal Movement Counts Patient Name: ________________________________________________ Patient Due Date: ____________________  What is a fetal movement count? A fetal movement count is the number of times that you feel your baby move during a certain amount of time. This may also be called a fetal kick count. A fetal movement count is recommended for every pregnant woman. You may be asked to start counting fetal movements as early as week 28 of your pregnancy. Pay attention to when your baby is most active. You may notice your baby's sleep and wake cycles. You may also notice things that make your baby move more. You should do a fetal movement count:  When your baby is normally most active.  At the same time each day. A good time to count movements is while you are resting, after having something to eat and drink. How do I count fetal movements? 1. Find a quiet, comfortable area. Sit, or lie down on your side. 2. Write down the date, the start time and stop time, and the number of movements that you felt between those two times. Take this information with you to your health care visits. 3. Write down your start time when you feel the first movement. 4. Count kicks, flutters, swishes, rolls, and jabs. You should feel at least 10 movements. 5. You may stop counting after you have felt 10 movements, or if you have been counting for 2 hours. Write down the stop time. 6. If you do not feel 10 movements in 2 hours, contact your health care provider for further instructions. Your health care provider may want to do additional tests to assess your baby's well-being. Contact a health care provider if:  You feel fewer than 10 movements in 2 hours.  Your baby is not moving like he or she usually does. Date: ____________ Start time: ____________ Stop time: ____________ Movements: ____________ Date: ____________ Start time: ____________ Stop time: ____________ Movements: ____________ Date: ____________  Start time: ____________ Stop time: ____________ Movements: ____________ Date: ____________ Start time: ____________ Stop time: ____________ Movements: ____________ Date: ____________ Start time: ____________ Stop time: ____________ Movements: ____________ Date: ____________ Start time: ____________ Stop time: ____________ Movements: ____________ Date: ____________ Start time: ____________ Stop time: ____________ Movements: ____________ Date: ____________ Start time: ____________ Stop time: ____________ Movements: ____________ Date: ____________ Start time: ____________ Stop time: ____________ Movements: ____________ This information is not intended to replace advice given to you by your health care provider. Make sure you discuss any questions you have with your health care provider. Document Revised: 05/08/2019 Document Reviewed: 05/08/2019 Elsevier Patient Education  2021 [employer]  

## 2021-02-17 NOTE — Progress Notes (Signed)
Pt states that she is having some sharp pain in upper right abd.  Discuss fetal movement, pt states decrease today.

## 2021-02-21 ENCOUNTER — Encounter: Payer: Self-pay | Admitting: Physical Therapy

## 2021-02-21 ENCOUNTER — Other Ambulatory Visit: Payer: Self-pay

## 2021-02-21 ENCOUNTER — Ambulatory Visit: Payer: Medicaid Other | Admitting: Physical Therapy

## 2021-02-21 DIAGNOSIS — R102 Pelvic and perineal pain: Secondary | ICD-10-CM | POA: Diagnosis not present

## 2021-02-21 DIAGNOSIS — M6281 Muscle weakness (generalized): Secondary | ICD-10-CM | POA: Diagnosis not present

## 2021-02-21 DIAGNOSIS — M545 Low back pain, unspecified: Secondary | ICD-10-CM | POA: Diagnosis not present

## 2021-02-21 NOTE — Patient Instructions (Signed)
Toileting Techniques for Bowel Movements    An Evacuation/Defecation Plan   Here are the 4 basic points:  1. Lean forward enough for your elbows to rest on your knees 2. Support your feet on the floor or use a low stool if your feet don't touch the floor  3. Push out your belly as if you have swallowed a beach ball--you should feel a widening of your waist. "Belly Big, Belly Hard" 4. Open and relax your pelvic floor muscles, rather than tightening around the anus  While you are sitting on the toilet pay attention to the following areas: . Jaw and mouth position- relaxed not clenched . Angle of your hips - leaning slightly forward . Whether your feet touch the ground or not - should be flat and supported . Arm placement - rest against your thighs . Spine position - flat back . Waist . Breathing - exhale as you push (like blowing up a balloon or try using other sounds such as ahhhh, shhhhh, ohhhh or grrrrrrr) . Belly - hard and tight as you push . Anus (opening of the anal canal) - relaxed and open as you push . Anus - Tighten and lift pulling the muscle back in after you are done or if taking a break  If you are not successful after 10-15 minutes, try again later.  Avoid negative self-talk about your toileting experience.   Read this for more details and ask your PT if you need suggestions for adjustments or limitations:  1) Sitting on the toilet  a) Make sure your feet are supported - flat on the floor or step stool b) Many people find it effective to lean forward or raise their knees.  Propping your feet on a step stool (Squatty Potty is a brand name) can help the muscles around the anus to relax  c) When you lean forward, place your forearms on your thighs for support  2) Relaxing a) Breathe deeply and slowly in through your nose and out through your mouth. b) To become aware of how to relax your muscles, contracting and releasing muscles can be helpful.  Pull your pelvic floor  muscles in tightly by using the image of holding back gas, or closing around the anus (visualize making a circle smaller) and lifting the anus up and in.  Then release the muscles and your anus should drop down and feel open. Repeat 5 times ending with the feeling of relaxation. c) Keep your pelvic floor muscles relaxed; let your belly bulge out. d) The digestive tract starts at the mouth and ends at the anal opening, so be sure to relax both ends of the tube.  Place your tongue on the roof of your mouth with your teeth separated.  This helps relax your mouth and will help to relax the anus at the same time.  3) Emptying (defecation) a) Keep your pelvic floor and sphincter relaxed, then bulge your anal muscles.  Make the anal opening wide.  b) Stick your belly out as if you have swallowed a beach ball. c) Make your belly Peeples hard using your belly muscles while continuing to breathe. Doing this makes it easier to open your anus. d) Breath out and give a grunt (or try using other sounds such as ahhhh, shhhhh, ohhhh or grrrrrrr). e)  Can also try to act as if you are blowing up a balloon as you push  4) Finishing a) As you finish your bowel movement, pull the pelvic floor muscles   up and in.  This will leave your anus in the proper place rather than remaining pushed out and down. If you leave your anus pushed out and down, it will start to feel as though that is normal and give you incorrect signals about needing to have a bowel movement. [facility] [address], [address] [place], [place] [address] Phone # [phone] Fax [phone]  

## 2021-02-21 NOTE — Therapy (Signed)
[facility] [address], [address] [place], [place], [address] Phone: [phone]   Fax:  [phone]  Physical Therapy Treatment  Patient Details  Name: Danielle Carter MRN: [id number] Date of Birth: 05/26/1996 Referring Provider (PT): Arbie Cookey   Encounter Date: 02/21/2021   PT End of Session - 02/21/21 1154    Visit Number 8    Date for PT Re-Evaluation 03/28/21    Authorization Type [employer]    Authorization Time Period 4/4-6/27    Authorization - Visit Number 8    Authorization - Number of Visits 24    PT Start Time 1107    PT Stop Time 1145    PT Time Calculation (min) 38 min    Activity Tolerance Patient tolerated treatment well;Patient limited by pain;No increased pain    Behavior During Therapy WFL for tasks assessed/performed           Past Medical History:  Diagnosis Date  . Anemia   . Anxiety   . Depression    going well  . Headache    'occular'  . Infection    UTI  . PTSD (post-traumatic stress disorder)   . Vaginal Pap smear, abnormal    ok since    Past Surgical History:  Procedure Laterality Date  . DRUG INDUCED ENDOSCOPY      There were no vitals filed for this visit.   Subjective Assessment - 02/21/21 1112    Subjective My legs are giving out. I have had more pain in the legs. The baby is pushing more down into the pelvis. I am stumbling more now. I have trouble putting weight onto my legs. I have to lean on the walls. The last session how helped. I have to take 10 minutes to have a bowel movement due to all the pressure in my pelvis.    Limitations Standing;Walking;Sitting    Patient Stated Goals reduce pain and function    Currently in Pain? Yes    Pain Score 6     Pain Location Other (Comment)   vaginal, hips   Pain Orientation Right;Left    Pain Descriptors / Indicators Discomfort;Tightness    Pain Type Acute pain    Pain Onset More than a month ago    Pain Frequency  Constant    Aggravating Factors  pressure with the baby dowm in pelvis, sitting to have a bowel mobement    Pain Relieving Factors adjust    Multiple Pain Sites No                             [facility] Adult PT Treatment/Exercise - 02/21/21 0001      Self-Care   Self-Care Other Self-Care Comments    Other Self-Care Comments  using ice during the day to the perineal area to reduce pain and after giving birth      Therapeutic Activites    Therapeutic Activities Other Therapeutic Activities    Other Therapeutic Activities instructed patient on toileting to breath out while relaxing the pelvic floor and knees above hips      Neuro Re-ed    Neuro Re-ed Details  instructed patient on how to breath and relax her pelvic floor with bowel movements and during birth      Manual Therapy   Manual Therapy Soft tissue mobilization    Soft tissue mobilization the left gluteal, hip rotators, along the pubic rami, levator ani  PT Education - 02/21/21 1152    Education Details instructed patient on correct toileting techniques; using ice for perineal pain relief    Person(s) Educated Patient    Methods Explanation;Demonstration    Comprehension Verbalized understanding;Returned demonstration            PT Short Term Goals - 01/13/21 0930      PT SHORT TERM GOAL #1   Title independent with initial HEP for pain relief and stretches    Time 4    Period Weeks    Status Achieved             PT Long Term Goals - 02/21/21 1159      PT LONG TERM GOAL #1   Title independent with advanced HEP    Baseline continues to be educated    Time 12    Period Weeks    Status On-going      PT LONG TERM GOAL #2   Title able to move in bed and get in and out with pain level </= 4/5 using correct body mechanics    Baseline pain level 6/10    Time 12    Period Weeks    Status On-going      PT LONG TERM GOAL #3   Title able to stand erect with hip extension  at 0 degrees instead of -20 degrees due to increased spinal mobility    Time 12    Period Weeks    Status Achieved      PT LONG TERM GOAL #4   Title understand ways to give birth to reduce the strain on the lumbar and pelvic region    Baseline not educated yet    Time 12    Period Weeks    Status On-going                 Plan - 02/21/21 1155    Clinical Impression Statement Patient is in increased pain due to the baby is head first and has dropped and increasing pressure onto the pelvic floor. She reports her legs are giving way due to the increased pressure. Patient understands how to breathe while toileting and to use the same breath with birth so she is keeping the pelvic floor relaxed. Patient understands on using ice to reduce her pelvic pain and after she give birth. Patient reports therapy gives her some relief and helps her to get through her pregnancy.    Personal Factors and Comorbidities Fitness;Past/Current Experience;Profession;Comorbidity 1    Comorbidities presently pregnant    Examination-Activity Limitations Bed Mobility;Bend;Lift;Stand;Squat;Stairs;Sit;Caring for Others;Carry;Locomotion Level;Transfers    Examination-Participation Restrictions Meal Prep;Cleaning;Occupation;Driving;Shop;Laundry    Stability/Clinical Decision Making Evolving/Moderate complexity    Rehab Potential Good    PT Frequency 2x / week    PT Duration 12 weeks    PT Treatment/Interventions ADLs/Self Care Home Management;Cryotherapy;Moist Heat;Neuromuscular re-education;Therapeutic exercise;Therapeutic activities;Functional mobility training;Patient/family education;Manual techniques;Passive range of motion;Joint Manipulations;Spinal Manipulations;Taping    PT Next Visit Plan internal work to reduce trigger points, different birthing positions    PT Home Exercise Plan Access Code: [email or web address]    Consulted and Agree with Plan of Care Patient           Patient will benefit from skilled  therapeutic intervention in order to improve the following deficits and impairments:  Decreased coordination,Decreased range of motion,Increased fascial restricitons,Decreased endurance,Increased muscle spasms,Decreased activity tolerance,Pain,Improper body mechanics,Decreased strength,Decreased mobility  Visit Diagnosis: Muscle weakness (generalized)  Acute bilateral low back pain without sciatica  Pelvic  pain     Problem List Patient Active Problem List   Diagnosis Date Noted  . [redacted] weeks gestation of pregnancy 01/20/2021  . Pyelonephritis affecting pregnancy, antepartum 01/04/2021  . Candidal skin infection 11/23/2020  . Pelvic pain affecting pregnancy in second trimester, antepartum 11/23/2020  . Supervision of normal pregnancy, antepartum 09/29/2020  . History of preterm delivery 09/15/2019  . Low grade squamous intraepith lesion on cytologic smear cervix (lgsil) 02/26/2019  . Gastric ulcer 10/15/2013  . Major depression 10/15/2013  . PTSD (post-traumatic stress disorder) 10/15/2013    Eulis Foster, PT 02/21/21 12:01 PM   [facility] [address], [address] [place], [place], [address] Phone: [phone]   Fax:  [phone]  Name: RASCHELLE WISENBAKER MRN: [id number] Date of Birth: 08/15/1996

## 2021-03-03 ENCOUNTER — Encounter (HOSPITAL_COMMUNITY): Payer: Self-pay | Admitting: Obstetrics & Gynecology

## 2021-03-03 ENCOUNTER — Ambulatory Visit (INDEPENDENT_AMBULATORY_CARE_PROVIDER_SITE_OTHER): Payer: Medicaid Other | Admitting: Advanced Practice Midwife

## 2021-03-03 ENCOUNTER — Other Ambulatory Visit: Payer: Self-pay

## 2021-03-03 ENCOUNTER — Ambulatory Visit: Payer: Medicaid Other | Attending: Advanced Practice Midwife | Admitting: Physical Therapy

## 2021-03-03 ENCOUNTER — Encounter: Payer: Self-pay | Admitting: Physical Therapy

## 2021-03-03 ENCOUNTER — Inpatient Hospital Stay (HOSPITAL_COMMUNITY)
Admission: AD | Admit: 2021-03-03 | Discharge: 2021-03-04 | Disposition: A | Discharge status: Home / Self Care | Payer: Medicaid Other | Attending: Obstetrics & Gynecology | Admitting: Obstetrics & Gynecology

## 2021-03-03 VITALS — BP 132/79 | HR 102 | Wt 145.6 lb

## 2021-03-03 DIAGNOSIS — F17210 Nicotine dependence, cigarettes, uncomplicated: Secondary | ICD-10-CM | POA: Insufficient documentation

## 2021-03-03 DIAGNOSIS — O0990 Supervision of high risk pregnancy, unspecified, unspecified trimester: Secondary | ICD-10-CM

## 2021-03-03 DIAGNOSIS — O23593 Infection of other part of genital tract in pregnancy, third trimester: Secondary | ICD-10-CM

## 2021-03-03 DIAGNOSIS — B9689 Other specified bacterial agents as the cause of diseases classified elsewhere: Secondary | ICD-10-CM

## 2021-03-03 DIAGNOSIS — M545 Low back pain, unspecified: Secondary | ICD-10-CM | POA: Insufficient documentation

## 2021-03-03 DIAGNOSIS — O99333 Smoking (tobacco) complicating pregnancy, third trimester: Secondary | ICD-10-CM | POA: Insufficient documentation

## 2021-03-03 DIAGNOSIS — R102 Pelvic and perineal pain: Secondary | ICD-10-CM

## 2021-03-03 DIAGNOSIS — Z3A33 33 weeks gestation of pregnancy: Secondary | ICD-10-CM

## 2021-03-03 DIAGNOSIS — M6281 Muscle weakness (generalized): Secondary | ICD-10-CM | POA: Diagnosis not present

## 2021-03-03 DIAGNOSIS — O4703 False labor before 37 completed weeks of gestation, third trimester: Secondary | ICD-10-CM

## 2021-03-03 DIAGNOSIS — N760 Acute vaginitis: Secondary | ICD-10-CM | POA: Diagnosis not present

## 2021-03-03 DIAGNOSIS — Z881 Allergy status to other antibiotic agents status: Secondary | ICD-10-CM | POA: Diagnosis not present

## 2021-03-03 DIAGNOSIS — O26893 Other specified pregnancy related conditions, third trimester: Secondary | ICD-10-CM

## 2021-03-03 DIAGNOSIS — Z3689 Encounter for other specified antenatal screening: Secondary | ICD-10-CM

## 2021-03-03 DIAGNOSIS — O2302 Infections of kidney in pregnancy, second trimester: Secondary | ICD-10-CM

## 2021-03-03 LAB — URINALYSIS, ROUTINE W REFLEX MICROSCOPIC
Bilirubin Urine: NEGATIVE
Glucose, UA: NEGATIVE mg/dL
Hgb urine dipstick: NEGATIVE
Ketones, ur: NEGATIVE mg/dL
Nitrite: NEGATIVE
Protein, ur: 30 mg/dL — AB
Specific Gravity, Urine: 1.019 (ref 1.005–1.030)
WBC, UA: >50 WBC/hpf — ABNORMAL HIGH (ref 0–5)
pH: 7 (ref 5.0–8.0)

## 2021-03-03 LAB — WET PREP, GENITAL
Sperm: NONE SEEN
Trich, Wet Prep: NONE SEEN
Yeast Wet Prep HPF POC: NONE SEEN

## 2021-03-03 MED ORDER — TERBUTALINE SULFATE 1 MG/ML IJ SOLN
0.2500 mg | Freq: Once | INTRAMUSCULAR | Status: AC
  Administered 2021-03-04: 0.25 mg via SUBCUTANEOUS
  Filled 2021-03-03: qty 1

## 2021-03-03 MED ORDER — LACTATED RINGERS IV BOLUS
1000.0000 mL | Freq: Once | INTRAVENOUS | Status: AC
  Administered 2021-03-03: 1000 mL via INTRAVENOUS

## 2021-03-03 MED ORDER — FENTANYL CITRATE (PF) 100 MCG/2ML IJ SOLN
50.0000 ug | Freq: Once | INTRAMUSCULAR | Status: AC
  Administered 2021-03-03: 50 ug via INTRAVENOUS
  Filled 2021-03-03: qty 2

## 2021-03-03 NOTE — Therapy (Signed)
[facility] [address] [place], [place], [address] Phone: [phone]   Fax:  [phone]  Physical Therapy Treatment  Patient Details  Name: Danielle Carter MRN: [id number] Date of Birth: Dec 27, 1995 Referring Provider (PT): Arbie Cookey   Encounter Date: 03/03/2021   PT End of Session - 03/03/21 1101    Visit Number 9    Date for PT Re-Evaluation 03/28/21    Authorization Type [employer]    Authorization Time Period 4/4-6/27    Authorization - Visit Number 9    Authorization - Number of Visits 24    PT Start Time 1015    PT Stop Time 1055    PT Time Calculation (min) 40 min    Activity Tolerance Patient tolerated treatment well;Patient limited by pain;No increased pain    Behavior During Therapy WFL for tasks assessed/performed           Past Medical History:  Diagnosis Date  . Anemia   . Anxiety   . Depression    going well  . Headache    'occular'  . Infection    UTI  . PTSD (post-traumatic stress disorder)   . Vaginal Pap smear, abnormal    ok since    Past Surgical History:  Procedure Laterality Date  . DRUG INDUCED ENDOSCOPY      There were no vitals filed for this visit.   Subjective Assessment - 03/03/21 1020    Subjective My legs started to give out more and fall asleep. My left hip has given out 2 times yesterday. I have a constant pull in my left leg and dull pain in the right leg. I see the doctor today. I am 34 weeks tomorrow. I am talking medication for the contractions. Reduction of vaginal pain just feel pressure from the pergnancy.    Limitations Standing;Walking;Sitting    Patient Stated Goals reduce pain and function    Currently in Pain? Yes    Pain Score 6     Pain Location Hip    Pain Orientation Right;Left    Pain Descriptors / Indicators Constant    Pain Type Acute pain    Pain Onset More than a month ago    Pain Frequency Constant    Aggravating Factors   lifting legs up, sitting for too long. laying on one side, sit to stand    Pain Relieving Factors change position    Multiple Pain Sites No                             [facility] Adult PT Treatment/Exercise - 03/03/21 0001      Therapeutic Activites    Therapeutic Activities Other Therapeutic Activities    Other Therapeutic Activities educated patient on correct breathing techniques to relax the pelvic floor as she is pushing the baby out, how to move the baby from the stage 1 to stage 3 with different hip movement, laying on her side with a peanut ball to move the baby through the stages      Lumbar Exercises: Standing   Other Standing Lumbar Exercises standing leaning on high mat while therpist has a she around the hips and distraction, squeezinf the ilium and try squeezing the greater trochanter      Lumbar Exercises: Seated   Other Seated Lumbar Exercises sitting on ball with pelvic sway, pelvic circles, and pelvic tilt to reduc epain  Manual Therapy   Manual Therapy Soft tissue mobilization;Joint mobilization;Muscle Energy Technique    Joint Mobilization side glide to L1-L5 in prone with laying on two bolsters; sacral springing to improve mobility; PA mobiliztion to T5-T10, lower rib cage mobilization to improve mobility    Soft tissue mobilization to bilateral quadratus    Muscle Energy Technique to correct left ilium                  PT Education - 03/03/21 1059    Education Details education on different birthing positions, breathing while pushing    Person(s) Educated Patient    Methods Explanation;Demonstration    Comprehension Verbalized understanding;Returned demonstration            PT Short Term Goals - 03/03/21 1025      PT SHORT TERM GOAL #1   Title independent with initial HEP for pain relief and stretches    Time 4    Period Weeks    Status Achieved             PT Long Term Goals - 03/03/21 1026      PT LONG TERM GOAL #1    Title independent with advanced HEP    Time 12    Period Weeks    Status On-going      PT LONG TERM GOAL #2   Title able to move in bed and get in and out with pain level </= 4/10 using correct body mechanics    Baseline pain level 9/10    Time 12    Period Weeks    Status On-going      PT LONG TERM GOAL #3   Title able to stand erect with hip extension at 0 degrees instead of -20 degrees due to increased spinal mobility    Time 12    Period Weeks    Status Achieved      PT LONG TERM GOAL #4   Title understand ways to give birth to reduce the strain on the lumbar and pelvic region    Baseline educated today    Time 12    Period Weeks    Status On-going                 Plan - 03/03/21 1143    Clinical Impression Statement Patient is [redacted] weeks pregnant with increased in bilateral hip and back pain that is making it difficult to move around in her pregnancy. Patient left ililum was rotated and corrected in therapy. She had decreased lumbar and thoracic mobility that was improved after therapy. Patient is not having as much pelvic pain but pressure. Patient reports her legs have given out 2 times. She was educated on different birhting positions, how to breath correctly and how to progress the baby through the different stages. Patient will benefit from skilled therapy to give her pain relief as her pregnancy progresses.    Personal Factors and Comorbidities Fitness;Past/Current Experience;Profession;Comorbidity 1    Comorbidities presently pregnant    Examination-Activity Limitations Bed Mobility;Bend;Lift;Stand;Squat;Stairs;Sit;Caring for Others;Carry;Locomotion Level;Transfers    Examination-Participation Restrictions Meal Prep;Cleaning;Occupation;Driving;Shop;Laundry    Stability/Clinical Decision Making Evolving/Moderate complexity    Rehab Potential Good    PT Frequency 2x / week    PT Duration 12 weeks    PT Treatment/Interventions ADLs/Self Care Home  Management;Cryotherapy;Moist Heat;Neuromuscular re-education;Therapeutic exercise;Therapeutic activities;Functional mobility training;Patient/family education;Manual techniques;Passive range of motion;Joint Manipulations;Spinal Manipulations;Taping    PT Next Visit Plan work on pelvis and spine, see if ready for  discharge    PT Home Exercise Plan Access Code: [email or web address]    Consulted and Agree with Plan of Care Patient           Patient will benefit from skilled therapeutic intervention in order to improve the following deficits and impairments:  Decreased coordination,Decreased range of motion,Increased fascial restricitons,Decreased endurance,Increased muscle spasms,Decreased activity tolerance,Pain,Improper body mechanics,Decreased strength,Decreased mobility  Visit Diagnosis: Muscle weakness (generalized)  Acute bilateral low back pain without sciatica  Pelvic pain     Problem List Patient Active Problem List   Diagnosis Date Noted  . [redacted] weeks gestation of pregnancy 01/20/2021  . Pyelonephritis affecting pregnancy, antepartum 01/04/2021  . Candidal skin infection 11/23/2020  . Pelvic pain affecting pregnancy in second trimester, antepartum 11/23/2020  . Supervision of normal pregnancy, antepartum 09/29/2020  . History of preterm delivery 09/15/2019  . Low grade squamous intraepith lesion on cytologic smear cervix (lgsil) 02/26/2019  . Gastric ulcer 10/15/2013  . Major depression 10/15/2013  . PTSD (post-traumatic stress disorder) 10/15/2013    Eulis Foster, PT 03/03/21 11:48 AM   [facility] [address] [place], [place], [address] Phone: [phone]   Fax:  [phone]  Name: Danielle Carter MRN: [id number] Date of Birth: Sep 23, 1996

## 2021-03-03 NOTE — Progress Notes (Signed)
PRENATAL VISIT NOTE  Subjective:  Danielle Carter is a 25 y.o. [id number] at [redacted]w[redacted]d being seen today for ongoing prenatal care.  She is currently monitored for the following issues for this low-risk pregnancy and has Low grade squamous intraepith lesion on cytologic smear cervix (lgsil); Gastric ulcer; Major depression; PTSD (post-traumatic stress disorder); History of preterm delivery; Supervision of high risk pregnancy, antepartum; Candidal skin infection; Pelvic pain affecting pregnancy in second trimester, antepartum; Pyelonephritis affecting pregnancy, antepartum; and [redacted] weeks gestation of pregnancy on their problem list.  Patient reports painful regular contractions every 6 minutes since 2:30 pm today.  Contractions: Irregular. Vag. Bleeding: None.  Movement: Present. Denies leaking of fluid.   The following portions of the patient's history were reviewed and updated as appropriate: allergies, current medications, past family history, past medical history, past social history, past surgical history and problem list.   Objective:   Vitals:   03/03/21 1601  BP: 132/79  Pulse: (!) 102  Weight: 145 lb 9.6 oz (66 kg)    Fetal Status: Fetal Heart Rate (bpm): 153 Fundal Height: 33 cm Movement: Present  Presentation: Vertex  General:  Alert, oriented and cooperative. Patient is in no acute distress.  Skin: Skin is warm and dry. No rash noted.   Cardiovascular: Normal heart rate noted  Respiratory: Normal respiratory effort, no problems with respiration noted  Abdomen: Soft, gravid, appropriate for gestational age.  Pain/Pressure: Present     Pelvic: Cervical exam performed in the presence of a chaperone Dilation: 1.5 Effacement (%): 40 Station: -2  Extremities: Normal range of motion.     Mental Status: Normal mood and affect. Normal behavior. Normal judgment and thought content.   Assessment and Plan:  Pregnancy: G3P1102 at [redacted]w[redacted]d 1. Supervision of high risk pregnancy,  antepartum --Anticipatory guidance about next visits/weeks of pregnancy given. --Next visit in 2 weeks  2. [redacted] weeks gestation of pregnancy   3. Pyelonephritis complicating pregnancy, antepartum, second trimester --On Macrobid for suppression, no UTI s/sx  4. Pelvic pain affecting pregnancy in third trimester, antepartum --Pt reports significant pelvic pain, PT helped initially but not helping now. She is out of work and reports pain in her hips and radiating down both legs that is debilitating.  --Ice, pregnancy support, continue Flexeril PRN. --Elective 39 week IOL is reasonable given pt significant discomfort if labor does not begin before 39 weeks --Contraceptive counseling and pt interested in BTL vs IUD, BTL consent done in office today.  5. Preterm uterine contractions in third trimester, antepartum --Pt reports contractions every 5-6 minutes and is breathing through some of them in the office today. Contractions are mild to palpation. --She tried rest, increased PO fluids but contractions continue. --Pt has Rx for Procardia but has not tried this today --Pt had intercourse within 24 hours so FFN not collected --Cervix 1/40/-2, today, no evidence of active PTL --Pt to go home, take Procardia dose, rest, drink more fluids.  If contractions 5-6 times per hour or more persist, pt to go to [facility] for PTL evaluation.  Preterm labor symptoms and general obstetric precautions including but not limited to vaginal bleeding, contractions, leaking of fluid and fetal movement were reviewed in detail with the patient. Please refer to After Visit Summary for other counseling recommendations.   Return in about 2 weeks (around 03/17/2021).  Future Appointments  Date Time Provider Department Center  03/10/2021 11:00 AM Theressa Millard, [place] [facility]  03/17/2021  3:50 PM [place], Arlana Pouch, MD [facility]  None    Sharen Counter, CNM

## 2021-03-03 NOTE — Patient Instructions (Signed)

## 2021-03-03 NOTE — MAU Provider Note (Signed)
History     CSN: [id number]  Arrival date and time: 03/03/21 2149   Event Date/Time   First Provider Initiated Contact with Patient 03/03/21 2244      Chief Complaint  Patient presents with  . Contractions   Danielle Carter is a 25 y.o. G3P1102 at [redacted]w[redacted]d who receives care at [facility].  She presents today for Contractions.  She states she started having contractions this afternoon around 1420 and was evaluated in the office.  She reports the contractions have continued despite Procardia dosing at 1745. She endorses fetal movement and denies vaginal discharge of concern.  She states that she noted some vaginal bleeding upon arrival, with wiping.  She reports sexual activity yesterday.    OB History    Gravida  3   Para  2   Term  1   Preterm  1   AB  0   Living  2     SAB      IAB      Ectopic      Multiple  0   Live Births  2           Past Medical History:  Diagnosis Date  . Anemia   . Anxiety   . Depression    going well  . Headache    'occular'  . Infection    UTI  . PTSD (post-traumatic stress disorder)   . Vaginal Pap smear, abnormal    ok since    Past Surgical History:  Procedure Laterality Date  . DRUG INDUCED ENDOSCOPY      Family History  Problem Relation Age of Onset  . Fibromyalgia Mother   . Seizures Mother   . Pancreatitis Father   . Graves' disease Father   . Depression Father     Social History   Tobacco Use  . Smoking status: Current Every Day Smoker    Packs/day: 0.50    Types: Cigarettes    Last attempt to quit: 12/10/2018    Years since quitting: 2.2  . Smokeless tobacco: Never Used  Vaping Use  . Vaping Use: Never used  Substance Use Topics  . Alcohol use: Never  . Drug use: Never    Allergies:  Allergies  Allergen Reactions  . Abilify [Aripiprazole] Other (See Comments)    Massive mood swings  . Nickel Other (See Comments)    Turns green  . Pristiq [Desvenlafaxine] Other (See Comments)    Massive  headaches  . Rocephin [Ceftriaxone Sodium In Dextrose] Hives  . Latex Rash    Facility-Administered Medications Prior to Admission  Medication Dose Route Frequency Provider Last Rate Last Admin  . cyclobenzaprine (FLEXERIL) tablet 5 mg  5 mg Oral TID PRN Burleson, Terri L, NP       Medications Prior to Admission  Medication Sig Dispense Refill Last Dose  . acetaminophen (TYLENOL) 500 MG tablet Take 1,000 mg by mouth every 6 (six) hours as needed for mild pain or headache.   Past Week at Unknown time  . NIFEdipine (PROCARDIA-XL/NIFEDICAL-XL) 30 MG 24 hr tablet Take 1 tablet (30 mg total) by mouth daily. Can increase to twice a day as needed for symptomatic contractions 30 tablet 2 03/03/2021 at Unknown time  . nitrofurantoin, macrocrystal-monohydrate, (MACROBID) 100 MG capsule Take 1 capsule (100 mg total) by mouth at bedtime. To start in 2 wks- after finishing Bactrim DS 30 capsule 3 03/03/2021 at Unknown time  . Prenatal Vit-Fe Fumarate-FA (PRENATAL MULTIVITAMIN) TABS tablet Take 1   tablet by mouth daily at 12 noon.   03/03/2021 at Unknown time  . Blood Pressure Monitor KIT 1 each by Does not apply route daily. 1 each 0   . clotrimazole-betamethasone (LOTRISONE) cream Apply 1 application topically 2 (two) times daily. 30 g 0   . cyclobenzaprine (FLEXERIL) 5 MG tablet Take 1 tablet (5 mg total) by mouth 3 (three) times daily as needed for muscle spasms. 30 tablet 0 More than a month at Unknown time    Review of Systems  Gastrointestinal: Positive for abdominal pain (Contractions).  Genitourinary: Positive for difficulty urinating and vaginal bleeding. Negative for dysuria and vaginal discharge.  Neurological: Negative for dizziness, light-headedness and headaches.   Physical Exam   Blood pressure 110/62, pulse 82, temperature 98.1 F (36.7 C), resp. rate 18, height 5' 4" (1.626 m), weight 64.9 kg, last menstrual period 07/09/2020, unknown if currently breastfeeding.  Physical  Exam Constitutional:      Appearance: Normal appearance.  HENT:     Head: Normocephalic and atraumatic.  Eyes:     Conjunctiva/sclera: Conjunctivae normal.  Cardiovascular:     Rate and Rhythm: Normal rate.  Pulmonary:     Effort: Pulmonary effort is normal.  Abdominal:     Palpations: Abdomen is soft.     Tenderness: There is no abdominal tenderness.     Comments: Gravid, Appears AGA Ctx palpate mild   Musculoskeletal:        General: Normal range of motion.     Cervical back: Normal range of motion.  Skin:    General: Skin is warm and dry.  Neurological:     Mental Status: She is alert and oriented to person, place, and time.  Psychiatric:        Mood and Affect: Mood normal.        Behavior: Behavior normal.        Thought Content: Thought content normal.     Fetal Assessment 135 bpm, Mod Var, -Decels, +Accels Toco: Q1-3min  MAU Course   Results for orders placed or performed during the hospital encounter of 03/03/21 (from the past 24 hour(s))  Urinalysis, Routine w reflex microscopic Urine, Clean Catch     Status: Abnormal   Collection Time: 03/03/21 10:05 PM  Result Value Ref Range   Color, Urine YELLOW YELLOW   APPearance CLOUDY (A) CLEAR   Specific Gravity, Urine 1.019 1.005 - 1.030   pH 7.0 5.0 - 8.0   Glucose, UA NEGATIVE NEGATIVE mg/dL   Hgb urine dipstick NEGATIVE NEGATIVE   Bilirubin Urine NEGATIVE NEGATIVE   Ketones, ur NEGATIVE NEGATIVE mg/dL   Protein, ur 30 (A) NEGATIVE mg/dL   Nitrite NEGATIVE NEGATIVE   Leukocytes,Ua MODERATE (A) NEGATIVE   RBC / HPF 0-5 0 - 5 RBC/hpf   WBC, UA >50 (H) 0 - 5 WBC/hpf   Bacteria, UA RARE (A) NONE SEEN   Squamous Epithelial / LPF 6-10 0 - 5   Mucus PRESENT    Triple Phosphate Crystal PRESENT    Non Squamous Epithelial 0-5 (A) NONE SEEN  Wet prep, genital     Status: Abnormal   Collection Time: 03/03/21 10:42 PM  Result Value Ref Range   Yeast Wet Prep HPF POC NONE SEEN NONE SEEN   Trich, Wet Prep NONE SEEN  NONE SEEN   Clue Cells Wet Prep HPF POC PRESENT (A) NONE SEEN   WBC, Wet Prep HPF POC MANY (A) NONE SEEN   Sperm NONE SEEN    No results   found.  MDM PE Labs: Wet Prep, GC/CT EFM IV Narcotic Start IV, LR Bolus Assessment and Plan  25 year old G3P1102  SIUP at 33.6weeks Cat I FT Preterm Contractions  -POC Reviewed -Exam performed and findings discussed. -Cultures collected and pending.  -Informed that contractions can be related to recent sexual activity or other contributors. -Reassured that friable cervix is not uncommon during pregnancy.   -Due to low blood pressure can not give procardia. -Reviewed usage of terbutaline if needed. R/B reviewed.  -Patient offered and accepts pain medication. -Will give Fentanyl 50mcg -Start IV and give fluids -Will continue to monitor and reassess.  Jessica L Emly MSN, CNM 03/03/2021, 10:44 PM   Reassessment (11:38 PM) -Fluids complete -Patient reports no improvement in pain with fentanyl dosing. -States contractions feel stronger. -Will give terbutaline now and reassess.   Reassessment (1:02 AM)  -Patient reports improvement in contraction intensity. -VE remains unchanged -Discussed clue cells noted and treatment for BV. -Informed that contractions may not subside. -Instructed to implement pelvic rest for next 2 weeks. -Discussed how labor will not be stopped at >36 weeks. -Instructed to follow up as scheduled and return to MAU for worsening of symptoms. -Okay to continue procardia as needed. -Discharged to home in stable condition.  Jessica L Emly MSN, CNM Advanced Practice Provider, [facility]    

## 2021-03-03 NOTE — Progress Notes (Signed)
Pt reports fetal movement and states that she has been having contractions every 2-3 minutes for the last hour rating 7/10. Pt denies leaking fluid or vaginal bleeding. Pt states that she has not taken her Procardia today, last time was 2 days ago.

## 2021-03-03 NOTE — MAU Note (Signed)
Pt reports she went to [facility] appointment and was told she was 1.5 cm dilated. Has been having preterm ctx today. Took her procardia 5:45pm. And took a nap. When she woke up still feeling ctx  q 5 min and soreness. Denies any vag bleeding or discharge.

## 2021-03-04 ENCOUNTER — Other Ambulatory Visit: Payer: Self-pay | Admitting: Advanced Practice Midwife

## 2021-03-04 LAB — GC/CHLAMYDIA PROBE AMP (~~LOC~~) NOT AT ARMC
Chlamydia: NEGATIVE
Comment: NEGATIVE
Comment: NORMAL
Neisseria Gonorrhea: NEGATIVE

## 2021-03-04 MED ORDER — BREAST PUMP MISC: 1.0000 | Freq: Every day | 0 refills | Status: DC

## 2021-03-04 MED ORDER — METRONIDAZOLE 0.75 % VA GEL: 1.0000 | Freq: Every day | VAGINAL | 0 refills | Status: DC

## 2021-03-04 NOTE — Discharge Instructions (Signed)
Bacterial Vaginosis  Bacterial vaginosis is an infection that occurs when the normal balance of bacteria in the vagina changes. This change is caused by an overgrowth of certain bacteria in the vagina. Bacterial vaginosis is the most common vaginal infection among females aged 23 to 44 years. This condition increases the risk of sexually transmitted infections (STIs). Treatment can help reduce this risk. Treatment is very important for pregnant women because this condition can cause babies to be born early (prematurely) or at a low birth weight. What are the causes? This condition is caused by an increase in harmful bacteria that are normally present in small amounts in the vagina. However, the exact reason this condition develops is not known. You cannot get bacterial vaginosis from toilet seats, bedding, swimming pools, or contact with objects around you. What increases the risk? The following factors may make you more likely to develop this condition:  Having a new sexual partner or multiple sexual partners, or having unprotected sex.  Douching.  Having an intrauterine device (IUD).  Smoking.  Abusing drugs and alcohol. This may lead to riskier sexual behavior.  Taking certain antibiotic medicines.  Being pregnant. What are the signs or symptoms? Some women with this condition have no symptoms. Symptoms may include:  Gray or white vaginal discharge. The discharge can be watery or foamy.  A fish-like odor with discharge, especially after sex or during menstruation.  Itching in and around the vagina.  Burning or pain with urination. How is this diagnosed? This condition is diagnosed based on:  Your medical history.  A physical exam of the vagina.  Checking a sample of vaginal fluid for harmful bacteria or abnormal cells. How is this treated? This condition is treated with antibiotic medicines. These may be given as a pill, a vaginal cream, or a medicine that is put into the  vagina (suppository). If the condition comes back after treatment, a second round of antibiotics may be needed. Follow these instructions at home: Medicines  Take or apply over-the-counter and prescription medicines only as told by your health care provider.  Take or apply your antibiotic medicine as told by your health care provider. Do not stop using the antibiotic even if you start to feel better. General instructions  If you have a female sexual partner, tell her that you have a vaginal infection. She should follow up with her health care provider. If you have a female sexual partner, he does not need treatment.  Avoid sexual activity until you finish treatment.  Drink enough fluid to keep your urine pale yellow.  Keep the area around your vagina and rectum clean. ? Wash the area daily with warm water. ? Wipe yourself from front to back after using the toilet.  If you are breastfeeding, talk to your health care provider about continuing breastfeeding during treatment.  Keep all follow-up visits. This is important. How is this prevented? Self-care  Do not douche.  Wash the outside of your vagina with warm water only.  Wear cotton or cotton-lined underwear.  Avoid wearing tight pants and pantyhose, especially during the summer. Safe sex  Use protection when having sex. This includes: ? Using condoms. ? Using dental dams. This is a thin layer of a material made of latex or polyurethane that protects the mouth during oral sex.  Limit the number of sexual partners. To help prevent bacterial vaginosis, it is best to have sex with just one partner (monogamous relationship).  Make sure you and your sexual partner   are tested for STIs. Drugs and alcohol  Do not use any products that contain nicotine or tobacco. These products include cigarettes, chewing tobacco, and vaping devices, such as e-cigarettes. If you need help quitting, ask your health care provider.  Do not use  drugs.  Do not drink alcohol if: ? Your health care provider tells you not to do this. ? You are pregnant, may be pregnant, or are planning to become pregnant.  If you drink alcohol: ? Limit how much you have to 0-1 drink a day. ? Be aware of how much alcohol is in your drink. In the [place] one drink equals one 12 oz bottle of beer (355 mL), one 5 oz glass of wine (148 mL), or one 1 oz glass of hard liquor (44 mL). Where to find more information  Centers for Disease Control and Prevention: www.cdc.gov  American Sexual Health Association (ASHA): www.ashastd.org  [place] Department of Health and Human Services, Office on Women's Health: www.womenshealth.gov Contact a health care provider if:  Your symptoms do not improve, even after treatment.  You have more discharge or pain when urinating.  You have a fever or chills.  You have pain in your abdomen or pelvis.  You have pain during sex.  You have vaginal bleeding between menstrual periods. Summary  Bacterial vaginosis is a vaginal infection that occurs when the normal balance of bacteria in the vagina changes. It results from an overgrowth of certain bacteria.  This condition increases the risk of sexually transmitted infections (STIs). Getting treated can help reduce this risk.  Treatment is very important for pregnant women because this condition can cause babies to be born early (prematurely) or at low birth weight.  This condition is treated with antibiotic medicines. These may be given as a pill, a vaginal cream, or a medicine that is put into the vagina (suppository). This information is not intended to replace advice given to you by your health care provider. Make sure you discuss any questions you have with your health care provider. Document Revised: 03/18/2020 Document Reviewed: 03/18/2020 Elsevier Patient Education  2021 Elsevier Inc.  

## 2021-03-06 LAB — URINE CULTURE: Culture: >=100000 — AB

## 2021-03-07 ENCOUNTER — Other Ambulatory Visit: Payer: Self-pay

## 2021-03-07 ENCOUNTER — Encounter (HOSPITAL_COMMUNITY): Payer: Self-pay | Admitting: Family Medicine

## 2021-03-07 ENCOUNTER — Inpatient Hospital Stay (HOSPITAL_COMMUNITY)
Admission: AD | Admit: 2021-03-07 | Discharge: 2021-03-08 | Disposition: A | Discharge status: Home / Self Care | Payer: Medicaid Other | Attending: Family Medicine | Admitting: Family Medicine

## 2021-03-07 ENCOUNTER — Telehealth: Payer: Self-pay | Admitting: Student

## 2021-03-07 ENCOUNTER — Other Ambulatory Visit: Payer: Self-pay | Admitting: Student

## 2021-03-07 DIAGNOSIS — O0990 Supervision of high risk pregnancy, unspecified, unspecified trimester: Secondary | ICD-10-CM

## 2021-03-07 DIAGNOSIS — Z3A34 34 weeks gestation of pregnancy: Secondary | ICD-10-CM | POA: Insufficient documentation

## 2021-03-07 DIAGNOSIS — Z3689 Encounter for other specified antenatal screening: Secondary | ICD-10-CM | POA: Insufficient documentation

## 2021-03-07 DIAGNOSIS — Z3493 Encounter for supervision of normal pregnancy, unspecified, third trimester: Secondary | ICD-10-CM | POA: Insufficient documentation

## 2021-03-07 DIAGNOSIS — Z79899 Other long term (current) drug therapy: Secondary | ICD-10-CM | POA: Insufficient documentation

## 2021-03-07 NOTE — Telephone Encounter (Signed)
Called patient and confirmed name and DOB. Discussed her new symptoms (sharp pain after urination) and new urine results, showing a new bacteria was growing in her urine. Patient reports that she is not consistently taking her Macrodbid daily. Informed patient that I had consulted with attending, who recommended taking 5 day course of macrobid BID, and then resume nightly prophylaxis. Encouraged patient to be diligent about taking her medicines, reviewed importance of adherence. Patient states that she has plenty of pills at home and she will take the Macrobid BID for 5 days and does not need a refill right now. Recommended that she talk to her provider at next visit about possibly switching medicines at 37 weeks. Patient verbalized understanding.   Luna Kitchens

## 2021-03-07 NOTE — MAU Note (Signed)
PT SAYS AT 2PM- WENT TO RESTROOM- FLUID CAME OUT- DOESN'T THINK ITS URINE AT 630- FLUID CONTINUED - SAME NOW. NURSE CALLED PT TODAY - TOLD HER UTI ( HAS HX OF UTI- TAKING  ANTIBX )  [facility]  WAS HERE ON 6-2- FOR UC'S - VE WAS 1-2 CM.  FEELS MILD UC'S - HAS PRESSURE IN PELVIS

## 2021-03-08 DIAGNOSIS — Z3A34 34 weeks gestation of pregnancy: Secondary | ICD-10-CM | POA: Diagnosis not present

## 2021-03-08 DIAGNOSIS — Z3689 Encounter for other specified antenatal screening: Secondary | ICD-10-CM | POA: Diagnosis not present

## 2021-03-08 DIAGNOSIS — Z3493 Encounter for supervision of normal pregnancy, unspecified, third trimester: Secondary | ICD-10-CM | POA: Diagnosis not present

## 2021-03-08 DIAGNOSIS — Z0371 Encounter for suspected problem with amniotic cavity and membrane ruled out: Secondary | ICD-10-CM | POA: Diagnosis not present

## 2021-03-08 DIAGNOSIS — Z79899 Other long term (current) drug therapy: Secondary | ICD-10-CM | POA: Diagnosis not present

## 2021-03-08 LAB — POCT FERN TEST: POCT Fern Test: NEGATIVE

## 2021-03-08 MED ORDER — NITROFURANTOIN MONOHYD MACRO 100 MG PO CAPS: 200.0000 mg | ORAL_CAPSULE | Freq: Every day | ORAL | Status: DC

## 2021-03-08 NOTE — MAU Provider Note (Signed)
Event Date/Time   First Provider Initiated Contact with Patient 03/07/21 2354     S: [name] is a 25 y.o. [id number] at [redacted]w[redacted]d  who presents to MAU today complaining of leaking of fluid since 2pm. She denies vaginal bleeding. She endorses mild contractions. She reports normal fetal movement.    O: BP 104/68 (BP Location: Right Arm)   Pulse (!) 103   Temp 98.2 F (36.8 C) (Oral)   Resp 20   Ht $R'5\' 4"'DJ$  (1.626 m)   Wt 147 lb (66.7 kg)   LMP 07/09/2020   BMI 25.23 kg/m  GENERAL: Well-developed, well-nourished female in no acute distress.  HEAD: Normocephalic, atraumatic.  CHEST: Normal effort of breathing, regular heart rate ABDOMEN: Soft, nontender, gravid PELVIC: Normal external female genitalia. Vagina is pink and rugated. Cervix with normal contour, no lesions. Normal discharge.  No pooling.   Cervical exam (unchanged from last exam):  Dilation: 1.5 Effacement (%): 50 Station: -2 Presentation: Vertex Exam by:: J.Vahan Wadsworth,CNM  Fetal Monitoring: reactive Baseline: 135 Variability: moderate Accelerations: 15x15 Decelerations: none Contractions: UI  Fern test negative x2  A: SIUP at [redacted]w[redacted]d  Membranes intact  P: Discharge home in stable condition with preterm labor precautions  Follow up as scheduled at [facility]  Allergies as of 03/08/2021      Reactions   Abilify [aripiprazole] Other (See Comments)   Massive mood swings   Nickel Other (See Comments)   Turns green   Pristiq [desvenlafaxine] Other (See Comments)   Massive headaches   Rocephin [ceftriaxone Sodium In Dextrose] Hives   Latex Rash      Medication List    TAKE these medications   acetaminophen 500 MG tablet Commonly known as: TYLENOL Take 1,000 mg by mouth every 6 (six) hours as needed for mild pain or headache.   Blood Pressure Monitor Kit 1 each by Does not apply route daily.   Breast Pump Misc 1 Device by Does not apply route daily.   clotrimazole-betamethasone cream Commonly known  as: Lotrisone Apply 1 application topically 2 (two) times daily.   cyclobenzaprine 5 MG tablet Commonly known as: FLEXERIL Take 1 tablet (5 mg total) by mouth 3 (three) times daily as needed for muscle spasms.   metroNIDAZOLE 0.75 % vaginal gel Commonly known as: METROGEL VAGINAL Place 1 Applicatorful vaginally at bedtime. Insert one applicator, at bedtime, for 5 nights.   NIFEdipine 30 MG 24 hr tablet Commonly known as: PROCARDIA-XL/NIFEDICAL-XL Take 1 tablet (30 mg total) by mouth daily. Can increase to twice a day as needed for symptomatic contractions   nitrofurantoin (macrocrystal-monohydrate) 100 MG capsule Commonly known as: Macrobid Take 2 capsules (200 mg total) by mouth at bedtime. To start in 2 wks- after finishing Bactrim DS   prenatal multivitamin Tabs tablet Take 1 tablet by mouth daily at 12 noon.      Danielle Carter, CNM 03/08/2021 1:08 AM

## 2021-03-10 ENCOUNTER — Ambulatory Visit: Payer: Medicaid Other | Admitting: Physical Therapy

## 2021-03-10 ENCOUNTER — Other Ambulatory Visit: Payer: Self-pay

## 2021-03-10 ENCOUNTER — Encounter: Payer: Self-pay | Admitting: Physical Therapy

## 2021-03-10 DIAGNOSIS — M6281 Muscle weakness (generalized): Secondary | ICD-10-CM | POA: Diagnosis not present

## 2021-03-10 DIAGNOSIS — M545 Low back pain, unspecified: Secondary | ICD-10-CM | POA: Diagnosis not present

## 2021-03-10 DIAGNOSIS — R102 Pelvic and perineal pain: Secondary | ICD-10-CM | POA: Diagnosis not present

## 2021-03-10 NOTE — Therapy (Signed)
[facility] [address] [place], [place], [address] Phone: [phone]   Fax:  [phone]  Physical Therapy Treatment  Patient Details  Name: Danielle Carter MRN: [id number] Date of Birth: 11-Oct-1995 Referring Provider (PT): Blanch Media   Encounter Date: 03/10/2021   PT End of Session - 03/10/21 1138     Visit Number 10    Date for PT Re-Evaluation 03/28/21    Authorization Type [employer]    Authorization Time Period 4/4-6/27    Authorization - Visit Number 10    Authorization - Number of Visits 24    PT Start Time 1100    PT Stop Time 3154    PT Time Calculation (min) 38 min    Activity Tolerance Patient tolerated treatment well;Patient limited by pain;No increased pain    Behavior During Therapy [facility] for tasks assessed/performed             Past Medical History:  Diagnosis Date   Anemia    Anxiety    Depression    going well   Headache    'occular'   Infection    UTI   PTSD (post-traumatic stress disorder)    Vaginal Pap smear, abnormal    ok since    Past Surgical History:  Procedure Laterality Date   DRUG INDUCED ENDOSCOPY      There were no vitals filed for this visit.   Subjective Assessment - 03/10/21 1105     Subjective I went into labor after last visit. I have dilated. I am having reatic contractions.    Patient Stated Goals reduce pain and function    Currently in Pain? Yes    Pain Score 5     Pain Location Pelvis   pelvic pressure   Pain Orientation Lower    Pain Descriptors / Indicators Pressure    Pain Type Acute pain    Pain Onset More than a month ago    Pain Frequency Constant    Aggravating Factors  lifting legs up, sitting for too long; laying on one side    Pain Relieving Factors change position    Multiple Pain Sites No                [facility] PT Assessment - 03/10/21 0001       Assessment   Medical Diagnosis O26.892, R10.2 Pelvic pain affecting  pregnancy in second trimester, antepartum    Referring Provider (PT) Blanch Media    Prior Therapy none      Precautions   Precautions Other (comment)    Precaution Comments presently pregnant      Restrictions   Weight Bearing Restrictions No      Home Environment   Living Environment Private residence      Prior Function   Level of Independence Independent    Vocation Full time employment    Vocation Requirements sitting, works in call center      Cognition   Overall Cognitive Status Within Functional Limits for tasks assessed      Posture/Postural Control   Posture/Postural Control Postural limitations    Posture Comments pregnant posture, stand with hip at -20 degress lacking extension      ROM / Strength   AROM / PROM / Strength AROM;PROM;Strength      AROM   Right Hip Extension 5    Left Hip Extension 5    Lumbar Flexion correct alignment  Pelvic Floor Special Questions - 03/10/21 0001     Number of Vaginal Deliveries 2    Episiotomy Performed No    Currently Sexually Active Yes    Is this Painful No    Urinary Leakage No               [facility] Adult PT Treatment/Exercise - 03/10/21 0001       Self-Care   Self-Care Other Self-Care Comments    Other Self-Care Comments  reviewed information  for pre and post partum, giving birth, and perineal care for afterwards      Manual Therapy   Manual Therapy Muscle Energy Technique;Soft tissue mobilization    Manual therapy comments leg pull on the left to lengthen the muscles    Soft tissue mobilization bilateral hip adductor, gluteal, hip flexor and rotators, and quadriceps    Muscle Energy Technique to correct left ilium and showed her how to perform on herself.                      PT Short Term Goals - 03/10/21 1108       PT SHORT TERM GOAL #1   Title independent with initial HEP for pain relief and stretches    Time 4    Period Weeks    Status  Achieved               PT Long Term Goals - 03/10/21 1108       PT LONG TERM GOAL #1   Title independent with advanced HEP    Time 12    Period Weeks    Status Achieved      PT LONG TERM GOAL #2   Title able to move in bed and get in and out with pain level </= 4/10 using correct body mechanics    Baseline pain level 9/10    Time 12    Period Weeks    Status Not Met      PT LONG TERM GOAL #3   Title able to stand erect with hip extension at 0 degrees instead of -20 degrees due to increased spinal mobility    Time 12    Period Weeks    Status Achieved      PT LONG TERM GOAL #4   Title understand ways to give birth to reduce the strain on the lumbar and pelvic region    Time 19    Period Weeks    Status Achieved                   Plan - 03/10/21 1139     Clinical Impression Statement Patient understands different ways to give birth, how to breath correctly during birth, how to take care of the perineum after birth. Patient pelvis in correct alignment after manual therapy. Her left hip would get stuck wiht movement and give out. Patient has been having contractions and is dilated. She will be safe to have the baby after today. Patient is independent with her HEP and ready for discharge.    Personal Factors and Comorbidities Fitness;Past/Current Experience;Profession;Comorbidity 1    Comorbidities presently pregnant    Examination-Activity Limitations Bed Mobility;Bend;Lift;Stand;Squat;Stairs;Sit;Caring for Others;Carry;Locomotion Level;Transfers    Examination-Participation Restrictions Meal Prep;Cleaning;Occupation;Driving;Shop;Laundry    Stability/Clinical Decision Making Evolving/Moderate complexity    Rehab Potential Good    PT Treatment/Interventions ADLs/Self Care Home Management;Cryotherapy;Moist Heat;Neuromuscular re-education;Therapeutic exercise;Therapeutic activities;Functional mobility training;Patient/family education;Manual techniques;Passive range  of motion;Joint Manipulations;Spinal Manipulations;Taping  PT Next Visit Plan Discharge to HEP    PT Home Exercise Plan Access Code: [email or web address]    Consulted and Agree with Plan of Care Patient             Patient will benefit from skilled therapeutic intervention in order to improve the following deficits and impairments:  Decreased coordination, Decreased range of motion, Increased fascial restricitons, Decreased endurance, Increased muscle spasms, Decreased activity tolerance, Pain, Improper body mechanics, Decreased strength, Decreased mobility  Visit Diagnosis: Muscle weakness (generalized)  Acute bilateral low back pain without sciatica  Pelvic pain     Problem List Patient Active Problem List   Diagnosis Date Noted   [redacted] weeks gestation of pregnancy 01/20/2021   Pyelonephritis affecting pregnancy, antepartum 01/04/2021   Candidal skin infection 11/23/2020   Pelvic pain affecting pregnancy in second trimester, antepartum 11/23/2020   Supervision of high risk pregnancy, antepartum 09/29/2020   History of preterm delivery 09/15/2019   Low grade squamous intraepith lesion on cytologic smear cervix (lgsil) 02/26/2019   Gastric ulcer 10/15/2013   Major depression 10/15/2013   PTSD (post-traumatic stress disorder) 10/15/2013    Earlie Counts, PT 03/10/21 11:43 AM  [facility] [address] [place], [place], [address] Phone: [phone]   Fax:  [phone]  Name: Danielle Carter MRN: [id number] Date of Birth: October 09, 1995  PHYSICAL THERAPY DISCHARGE SUMMARY  Visits from Start of Care: 10  Current functional level related to goals / functional outcomes: See above.    Remaining deficits: See above.    Education / Equipment: HEP  Plan: Patient agrees to discharge.  Patient goals were partially met. Patient is being discharged due to meeting the stated rehab goals.  Thank you for the referral. Earlie Counts,  PT 03/10/21 11:44 AM

## 2021-03-15 ENCOUNTER — Other Ambulatory Visit: Payer: Self-pay

## 2021-03-15 ENCOUNTER — Inpatient Hospital Stay (HOSPITAL_COMMUNITY)
Admission: AD | Admit: 2021-03-15 | Discharge: 2021-03-15 | Disposition: A | Discharge status: Home / Self Care | Payer: Medicaid Other | Attending: Obstetrics and Gynecology | Admitting: Obstetrics and Gynecology

## 2021-03-15 ENCOUNTER — Encounter (HOSPITAL_COMMUNITY): Payer: Self-pay | Admitting: Obstetrics and Gynecology

## 2021-03-15 DIAGNOSIS — O4703 False labor before 37 completed weeks of gestation, third trimester: Secondary | ICD-10-CM

## 2021-03-15 DIAGNOSIS — Z87891 Personal history of nicotine dependence: Secondary | ICD-10-CM | POA: Diagnosis not present

## 2021-03-15 DIAGNOSIS — Z3A35 35 weeks gestation of pregnancy: Secondary | ICD-10-CM | POA: Insufficient documentation

## 2021-03-15 DIAGNOSIS — O0990 Supervision of high risk pregnancy, unspecified, unspecified trimester: Secondary | ICD-10-CM

## 2021-03-15 DIAGNOSIS — O479 False labor, unspecified: Secondary | ICD-10-CM

## 2021-03-15 LAB — URINALYSIS, ROUTINE W REFLEX MICROSCOPIC
Bilirubin Urine: NEGATIVE
Glucose, UA: NEGATIVE mg/dL
Ketones, ur: NEGATIVE mg/dL
Nitrite: NEGATIVE
Protein, ur: 30 mg/dL — AB
Specific Gravity, Urine: 1.015 (ref 1.005–1.030)
WBC, UA: >50 WBC/hpf — ABNORMAL HIGH (ref 0–5)
pH: 7 (ref 5.0–8.0)

## 2021-03-15 MED ORDER — LACTATED RINGERS IV BOLUS
1000.0000 mL | Freq: Once | INTRAVENOUS | Status: AC
  Administered 2021-03-15: 1000 mL via INTRAVENOUS

## 2021-03-15 MED ORDER — BETAMETHASONE SOD PHOS & ACET 6 (3-3) MG/ML IJ SUSP
12.0000 mg | INTRAMUSCULAR | Status: DC
  Administered 2021-03-15: 12 mg via INTRAMUSCULAR
  Filled 2021-03-15 (×2): qty 5

## 2021-03-15 MED ORDER — CYCLOBENZAPRINE HCL 5 MG PO TABS
10.0000 mg | ORAL_TABLET | Freq: Once | ORAL | Status: AC
  Administered 2021-03-15: 10 mg via ORAL
  Filled 2021-03-15: qty 2

## 2021-03-15 MED ORDER — CYCLOBENZAPRINE HCL 5 MG PO TABS: 5.0000 mg | ORAL_TABLET | Freq: Three times a day (TID) | ORAL | 2 refills | Status: DC | PRN

## 2021-03-15 MED ORDER — ACETAMINOPHEN 500 MG PO TABS
1000.0000 mg | ORAL_TABLET | Freq: Once | ORAL | Status: AC
  Administered 2021-03-15: 1000 mg via ORAL
  Filled 2021-03-15: qty 2

## 2021-03-15 NOTE — MAU Note (Signed)
Presents  with c/o ctxs 5-6 minutes apart that began @ 0700 this morning.  Denies VB or LOF.  Endorses +FM.  States has had PD @ 36 weeks.

## 2021-03-15 NOTE — MAU Provider Note (Signed)
History     CSN: [id number]  Arrival date and time: 03/15/21 1227   Event Date/Time   First Provider Initiated Contact with Patient 03/15/21 1317      Chief Complaint  Patient presents with   Contractions   HPI: Danielle Carter is a 25 y.o. [id number] at [redacted]w[redacted]d who presents to the MAU today with contractions every 5-7 minutes that started at 7 am. Patient notes her pain starts as an intense epigastric cramp that radiates down her abdomen. During the course of a contraction her pain level is at a 10/10 when contracting and an 8/10 in during the time between contractions. She previously managed this complaint with Procardia and Flexeril but has finished her original prescriptions. Pain improves with defecating.  Patient endorses high sex drive with multiple episodes of intercourse in the past 4-5 days. She also reports "just wanting to get to over with" and frequently walks for prolonged periods of time.   She denies vaginal bleeding, leaking of fluid, decreased fetal movement, fever, falls, or recent illness.   Patient receives care with [facility] and her next appointment is 03/17/2021.  OB History     Gravida  3   Para  2   Term  1   Preterm  1   AB  0   Living  2      SAB      IAB      Ectopic      Multiple  0   Live Births  2           Past Medical History:  Diagnosis Date   Anemia    Anxiety    Depression    going well   Headache    'occular'   Infection    UTI   PTSD (post-traumatic stress disorder)    Vaginal Pap smear, abnormal    ok since    Past Surgical History:  Procedure Laterality Date   DRUG INDUCED ENDOSCOPY      Family History  Problem Relation Age of Onset   Fibromyalgia Mother    Seizures Mother    Pancreatitis Father    Graves' disease Father    Depression Father     Social History   Tobacco Use   Smoking status: Former    Packs/day: 0.50    Pack years: 0.00    Types: Cigarettes    Quit date: 12/10/2018    Years  since quitting: 2.2   Smokeless tobacco: Never  Vaping Use   Vaping Use: Never used  Substance Use Topics   Alcohol use: Never   Drug use: Never    Allergies:  Allergies  Allergen Reactions   Abilify [Aripiprazole] Other (See Comments)    Massive mood swings   Nickel Other (See Comments)    Turns green   Pristiq [Desvenlafaxine] Other (See Comments)    Massive headaches   Rocephin [Ceftriaxone Sodium In Dextrose] Hives   Latex Rash    No medications prior to admission.    Review of Systems  Gastrointestinal:  Positive for abdominal pain (epigastric pain), nausea and vomiting.  Genitourinary:  Negative for dysuria and vaginal bleeding.  Neurological:  Positive for dizziness, light-headedness and headaches.  Physical Exam   Blood pressure 109/72, pulse 77, temperature 97.8 F (36.6 C), temperature source Oral, height 5\' 4"  (1.626 m), weight 67.6 kg, last menstrual period 07/09/2020, SpO2 98 %, unknown if currently breastfeeding.  Physical Exam Vitals and nursing note reviewed. Exam conducted with a  chaperone present.  Constitutional:      General: She is not in acute distress. HENT:     Head: Normocephalic and atraumatic.  Eyes:     Pupils: Pupils are equal, round, and reactive to light.  Cardiovascular:     Rate and Rhythm: Normal rate and regular rhythm.  Pulmonary:     Effort: Pulmonary effort is normal.  Abdominal:     Tenderness: There is no abdominal tenderness.  Musculoskeletal:     Right lower leg: No edema.     Left lower leg: No edema.  Skin:    General: Skin is warm and dry.  Neurological:     Mental Status: She is oriented to person, place, and time.  Psychiatric:        Mood and Affect: Mood normal.        Behavior: Behavior normal.   Cervical Exam: Dilation: 1-1.5 Effacement (%): 50% Station: -3 Presentation: cephalic Exam by: S. Najae Filsaime, CNM  MAU Course/MDM  Procedures  --Reactive tracing: baseline 135, mod var, + accels, no  decels --Toco: irregular ctx q 3-11 min, palpate mild --Cervix remains 1-1.5/50/-3 3 hours after original assessment. Hx of preterm birth, not on 17-P. --Discussed Betamethasone, not strictly indicated given lack of cervical change but justifiable if patient requests.  Patient Vitals for the past 24 hrs:  BP Temp Temp src Pulse SpO2 Height Weight  03/15/21 1313 109/72 -- -- 77 -- -- --  03/15/21 1253 107/68 97.8 F (36.6 C) Oral 81 98 % -- --  03/15/21 1247 -- -- -- -- -- 5\' 4"  (1.626 m) 67.6 kg   Orders Placed This Encounter  Procedures   Culture, OB Urine   Urinalysis, Routine w reflex microscopic Urine, Clean Catch   Insert peripheral IV   Discharge patient   Results for orders placed or performed during the hospital encounter of 03/15/21 (from the past 24 hour(s))  Urinalysis, Routine w reflex microscopic Urine, Clean Catch     Status: Abnormal   Collection Time: 03/15/21 12:55 PM  Result Value Ref Range   Color, Urine YELLOW YELLOW   APPearance CLOUDY (A) CLEAR   Specific Gravity, Urine 1.015 1.005 - 1.030   pH 7.0 5.0 - 8.0   Glucose, UA NEGATIVE NEGATIVE mg/dL   Hgb urine dipstick SMALL (A) NEGATIVE   Bilirubin Urine NEGATIVE NEGATIVE   Ketones, ur NEGATIVE NEGATIVE mg/dL   Protein, ur 30 (A) NEGATIVE mg/dL   Nitrite NEGATIVE NEGATIVE   Leukocytes,Ua MODERATE (A) NEGATIVE   RBC / HPF 21-50 0 - 5 RBC/hpf   WBC, UA >50 (H) 0 - 5 WBC/hpf   Bacteria, UA RARE (A) NONE SEEN   Squamous Epithelial / LPF 0-5 0 - 5   Mucus PRESENT    Amorphous Crystal PRESENT    Meds ordered this encounter  Medications   lactated ringers bolus 1,000 mL   cyclobenzaprine (FLEXERIL) tablet 10 mg   acetaminophen (TYLENOL) tablet 1,000 mg   betamethasone acetate-betamethasone sodium phosphate (CELESTONE) injection 12 mg   cyclobenzaprine (FLEXERIL) 5 MG tablet    Sig: Take 1 tablet (5 mg total) by mouth 3 (three) times daily as needed for muscle spasms.    Dispense:  30 tablet    Refill:  2     Order Specific Question:   Supervising Provider    Answer:   03/17/21 Montgomery Bing   Assessment and Plan  --25 y.o. 30  [redacted]w[redacted]d  --Braxton Hicks contractions --Reactive tracing --Cervix unchanged at 1.5 --Hx  preterm birth, not on 17-P --BMZ 1 of two given per patient request --Urine culture in work --Discharge home in stable condition  F/U: --Return to MAU in 24 hours for BMZ 2 of 2 --Next appointment [facility] is 03/17/2021  Clayton Bibles, MSN, CNM Certified Nurse Midwife, [facility] for [employer], [facility] 03/15/21 6:04 PM

## 2021-03-15 NOTE — MAU Provider Note (Addendum)
History     CSN: [id number]  Arrival date and time: 03/15/21 1227   Event Date/Time   First Provider Initiated Contact with Patient 03/15/21 1317      Chief Complaint  Patient presents with   Contractions   HPI: Danielle Carter is a 25 y.o. [id number] at [redacted]w[redacted]d who presents to the MAU today with contractions every 5-7 minutes that started at 7 am. Patient notes her pain starts as an intense epigastric cramp that radiates down her abdomen. During the course of a contraction her pain level is at a 10/10 when contracting and an 8/10 in during the time between contractions. She notes having the urge to defecate that provides some relief of her pain. She reports that she has been having intercourse multiple times over the past couple of days. Patient notes that she has had a headache, dizziness and lightheadedness starting this morning. She notes having nausea today with one episode of vomiting at 10 am. She reports having fetal movements that have decreased in the past couple of days, citing "I feel him shifting around". Denies vaginal bleeding, loss of fluid, vaginal discharge.   OB History     Gravida  3   Para  2   Term  1   Preterm  1   AB  0   Living  2      SAB      IAB      Ectopic      Multiple  0   Live Births  2           Past Medical History:  Diagnosis Date   Anemia    Anxiety    Depression    going well   Headache    'occular'   Infection    UTI   PTSD (post-traumatic stress disorder)    Vaginal Pap smear, abnormal    ok since    Past Surgical History:  Procedure Laterality Date   DRUG INDUCED ENDOSCOPY      Family History  Problem Relation Age of Onset   Fibromyalgia Mother    Seizures Mother    Pancreatitis Father    Graves' disease Father    Depression Father     Social History   Tobacco Use   Smoking status: Former    Packs/day: 0.50    Pack years: 0.00    Types: Cigarettes    Quit date: 12/10/2018    Years since quitting: 2.2    Smokeless tobacco: Never  Vaping Use   Vaping Use: Never used  Substance Use Topics   Alcohol use: Never   Drug use: Never    Allergies:  Allergies  Allergen Reactions   Abilify [Aripiprazole] Other (See Comments)    Massive mood swings   Nickel Other (See Comments)    Turns green   Pristiq [Desvenlafaxine] Other (See Comments)    Massive headaches   Rocephin [Ceftriaxone Sodium In Dextrose] Hives   Latex Rash    Facility-Administered Medications Prior to Admission  Medication Dose Route Frequency Provider Last Rate Last Admin   cyclobenzaprine (FLEXERIL) tablet 5 mg  5 mg Oral TID PRN Burleson, Terri L, NP       Medications Prior to Admission  Medication Sig Dispense Refill Last Dose   clotrimazole-betamethasone (LOTRISONE) cream Apply 1 application topically 2 (two) times daily. 30 g 0 Past Month   NIFEdipine (PROCARDIA-XL/NIFEDICAL-XL) 30 MG 24 hr tablet Take 1 tablet (30 mg total) by mouth daily. Can increase to  twice a day as needed for symptomatic contractions 30 tablet 2 Past Week   nitrofurantoin, macrocrystal-monohydrate, (MACROBID) 100 MG capsule Take 2 capsules (200 mg total) by mouth at bedtime. To start in 2 wks- after finishing Bactrim DS   03/14/2021   Prenatal Vit-Fe Fumarate-FA (PRENATAL MULTIVITAMIN) TABS tablet Take 1 tablet by mouth daily at 12 noon.   03/14/2021   acetaminophen (TYLENOL) 500 MG tablet Take 1,000 mg by mouth every 6 (six) hours as needed for mild pain or headache.      Blood Pressure Monitor KIT 1 each by Does not apply route daily. 1 each 0    cyclobenzaprine (FLEXERIL) 5 MG tablet Take 1 tablet (5 mg total) by mouth 3 (three) times daily as needed for muscle spasms. 30 tablet 0    metroNIDAZOLE (METROGEL VAGINAL) 0.75 % vaginal gel Place 1 Applicatorful vaginally at bedtime. Insert one applicator, at bedtime, for 5 nights. 70 g 0    Misc. Devices (BREAST PUMP) MISC 1 Device by Does not apply route daily. 1 each 0     Review of Systems   Gastrointestinal:  Positive for abdominal pain (epigastric pain), nausea and vomiting.  Genitourinary:  Negative for dysuria and vaginal bleeding.  Neurological:  Positive for dizziness, light-headedness and headaches.  Physical Exam   Blood pressure 109/72, pulse 77, temperature 97.8 F (36.6 C), temperature source Oral, height $RemoveBefo'5\' 4"'crAewThnSVQ$  (1.626 m), weight 67.6 kg, last menstrual period 07/09/2020, SpO2 98 %, unknown if currently breastfeeding.  Physical Exam Vitals and nursing note reviewed. Exam conducted with a chaperone present.  Constitutional:      General: She is not in acute distress. HENT:     Head: Normocephalic and atraumatic.  Eyes:     Pupils: Pupils are equal, round, and reactive to light.  Cardiovascular:     Rate and Rhythm: Normal rate and regular rhythm.  Pulmonary:     Effort: Pulmonary effort is normal.  Abdominal:     Tenderness: There is no abdominal tenderness.  Musculoskeletal:     Right lower leg: No edema.     Left lower leg: No edema.  Skin:    General: Skin is warm and dry.  Neurological:     Mental Status: She is oriented to person, place, and time.  Psychiatric:        Mood and Affect: Mood normal.        Behavior: Behavior normal.   Cervical Exam: Dilation: 1-1.5 Effacement (%): 50% Station: -3 Presentation: cephalic Exam by: S. Jeronimo Greaves, CNM   MAU Course  Procedures  MDM PE performed Labs: UA Start IV fluids, LR bolus, flexeril, tylenol  Assessment and Plan  Danielle Carter is a 25 y.o. [id number] -- SIUP at [redacted]w[redacted]d -- Category 1 fetal tracing. Reassuring tracing over the course of 3 hoursBaseline 135, mod var, + 15 x15 accels, no decels --Toco: irregular contractions q 3-9 contractions, resolving with fluid bolus -- Discussed preterm contractions. Advised patient that her contractions could be in relation to her recent sexual activity. --Cervix rechecked at 3 hours from initial, unchanged -- Exam was performed and findings discussed. --  UA collected. Results demonstrate moderate leukocytes, >50 WBCs, rare bacteria. Results could be due patient's recent sexual activity. Ordered urine culture, will call patient with the results.  -- Patient was offered and accepted pain medication with one dose of tylenol 1000 mg at 1404 and one dose of Flexiril $RemoveBef'10mg'WuGDpOnNyy$  at 1405 in the MAU.  -- Started IV fluids at 1400, stopped  at 1510.  -- Betamethasone acetate 12 mg IM injection today given at 1657. Patient was advised to return in 24 hours to receive 2nd injection. -- Prescribed Flexiril 5 mg tablet to be taken three times daily as needed for muscle spasms.  -- Instructed to follow-up with OB and return to the MAU for any worsening symptoms.   Danielle Carter 03/15/2021, 5:17 PM   Attestation of Supervision of Student:  I confirm that I have verified the information documented in the physician assistant student's note and that I have also personally reperformed the history, physical exam and all medical decision making activities.  I have verified that all services and findings are accurately documented in this student's note; and I agree with management and plan as outlined in the documentation. I have also made any necessary editorial changes.  --Student note, see separate CNM note  Darlina Rumpf, [place] for [employer], [employer] 03/15/2021 5:50 PM

## 2021-03-16 ENCOUNTER — Inpatient Hospital Stay (HOSPITAL_COMMUNITY)
Admission: AD | Admit: 2021-03-16 | Discharge: 2021-03-16 | Disposition: A | Discharge status: Home / Self Care | Payer: Medicaid Other | Attending: Obstetrics & Gynecology | Admitting: Obstetrics & Gynecology

## 2021-03-16 DIAGNOSIS — Z3A35 35 weeks gestation of pregnancy: Secondary | ICD-10-CM | POA: Insufficient documentation

## 2021-03-16 DIAGNOSIS — O26893 Other specified pregnancy related conditions, third trimester: Secondary | ICD-10-CM | POA: Diagnosis not present

## 2021-03-16 DIAGNOSIS — Z3689 Encounter for other specified antenatal screening: Secondary | ICD-10-CM

## 2021-03-16 MED ORDER — BETAMETHASONE SOD PHOS & ACET 6 (3-3) MG/ML IJ SUSP
12.0000 mg | Freq: Once | INTRAMUSCULAR | Status: AC
  Administered 2021-03-16: 12 mg via INTRAMUSCULAR

## 2021-03-16 NOTE — MAU Note (Signed)
Danielle Carter is a 25 y.o. at [redacted]w[redacted]d here in MAU reporting: here for 2nd BMZ. States she is having irregular contractions but they are not as bad as they previously have been, her "normal contractions". No bleeding or LOF. +FM  Onset of complaint: ongoing  Pain score: 0/10  Vitals:   03/16/21 1731  BP: 110/60  Pulse: 78  Resp: 16  Temp: 98 F (36.7 C)  SpO2: 98%     FHT:135  Lab orders placed from triage: none

## 2021-03-16 NOTE — MAU Provider Note (Signed)
Event Date/Time   First Provider Initiated Contact with Patient 03/16/21 1736      S Ms. Janelys Glassner Harlacher is a 25 y.o. [id number] patient who presents to MAU today for her second Betamethasone injection. Patient denies complaints today.  O BP 110/60 (BP Location: Right Arm)   Pulse 78   Temp 98 F (36.7 C) (Oral)   Resp 16   LMP 07/09/2020   SpO2 98% Comment: room air   Physical Exam Vitals and nursing note reviewed. Exam conducted with a chaperone present.  Constitutional:      Appearance: Normal appearance.  Cardiovascular:     Rate and Rhythm: Normal rate.  Pulmonary:     Effort: Pulmonary effort is normal.  Skin:    Capillary Refill: Capillary refill takes less than 2 seconds.  Neurological:     Mental Status: She is alert and oriented to person, place, and time.    A Medical screening exam complete Betamethasone course complete FHT 135 by Doppler  P Discharge from MAU in stable condition Patient may return to MAU as needed   F/U Next appointment at [facility] is 03/17/21  Calvert Cantor, CNM 03/16/2021 8:15 PM

## 2021-03-17 ENCOUNTER — Other Ambulatory Visit (HOSPITAL_COMMUNITY)
Admission: RE | Admit: 2021-03-17 | Discharge: 2021-03-17 | Disposition: A | Discharge status: Home / Self Care | Payer: Medicaid Other | Source: Ambulatory Visit | Attending: Obstetrics and Gynecology | Admitting: Obstetrics and Gynecology

## 2021-03-17 ENCOUNTER — Other Ambulatory Visit: Payer: Self-pay

## 2021-03-17 ENCOUNTER — Ambulatory Visit (INDEPENDENT_AMBULATORY_CARE_PROVIDER_SITE_OTHER): Payer: Medicaid Other | Admitting: Obstetrics and Gynecology

## 2021-03-17 VITALS — BP 119/64 | HR 64 | Wt 153.2 lb

## 2021-03-17 DIAGNOSIS — O0990 Supervision of high risk pregnancy, unspecified, unspecified trimester: Secondary | ICD-10-CM

## 2021-03-17 DIAGNOSIS — N3000 Acute cystitis without hematuria: Secondary | ICD-10-CM

## 2021-03-17 DIAGNOSIS — Z8751 Personal history of pre-term labor: Secondary | ICD-10-CM

## 2021-03-17 DIAGNOSIS — Z3A35 35 weeks gestation of pregnancy: Secondary | ICD-10-CM

## 2021-03-17 LAB — CULTURE, OB URINE: Culture: >=100000 — AB

## 2021-03-17 MED ORDER — NITROFURANTOIN MONOHYD MACRO 100 MG PO CAPS: 100.0000 mg | ORAL_CAPSULE | Freq: Two times a day (BID) | ORAL | 1 refills | Status: DC

## 2021-03-17 NOTE — Progress Notes (Signed)
Patient reports fetal movement but states that it has been decreased over the last few day, but patient reports that she was recently at the hospital and states that everything was fine.

## 2021-03-17 NOTE — Progress Notes (Signed)
   PRENATAL VISIT NOTE  Subjective:  Danielle Carter is a 25 y.o. [id number] at [redacted]w[redacted]d being seen today for ongoing prenatal care.  She is currently monitored for the following issues for this low-risk pregnancy and has Low grade squamous intraepith lesion on cytologic smear cervix (lgsil); Gastric ulcer; Major depression; PTSD (post-traumatic stress disorder); History of preterm delivery; Supervision of high risk pregnancy, antepartum; Candidal skin infection; Pelvic pain affecting pregnancy in second trimester, antepartum; Pyelonephritis affecting pregnancy, antepartum; and [redacted] weeks gestation of pregnancy on their problem list.  Patient reports very uncomfortable/frustrated in general. Everything uncomfortable. Having contractions and cramping irregularly.  Contractions: Irregular. Vag. Bleeding: None.  Movement: Present. Denies leaking of fluid.   Feeling irregular contractions and cramping fairly frequently. Has been seen in MAU several times.   The following portions of the patient's history were reviewed and updated as appropriate: allergies, current medications, past family history, past medical history, past social history, past surgical history and problem list.   Objective:   Vitals:   03/17/21 1605  BP: 119/64  Pulse: 64  Weight: 153 lb 3.2 oz (69.5 kg)    Fetal Status: Fetal Heart Rate (bpm): 147   Movement: Present     General:  Alert, oriented and cooperative. Patient is in no acute distress.  Skin: Skin is warm and dry. No rash noted.   Cardiovascular: Normal heart rate noted  Respiratory: Normal respiratory effort, no problems with respiration noted  Abdomen: Soft, gravid, appropriate for gestational age.  Pain/Pressure: Present     Pelvic: Cervical exam deferred        Extremities: Normal range of motion.  Edema: None  Mental Status: Normal mood and affect. Normal behavior. Normal judgment and thought content.   Assessment and Plan:  Pregnancy: G3P1102 at [redacted]w[redacted]d 1.  Supervision of high risk pregnancy, antepartum As patient leaving she felt like she had a gush of fluid. Brought back into room and performed sterile spec exam with negative pooling, neg ferning exam. Patient reassured. Counseled extensively on labor precautions.  2. [redacted] weeks gestation of pregnancy Swabs collected  3. History of preterm delivery BMZ complete, discussed signs/symptoms of labor   4. Acute cystitis without hematuria -culture growing staph saprophyticus again, sensitive to macrobid. Script sent. Will need retest in a few weeks.   Preterm labor symptoms and general obstetric precautions including but not limited to vaginal bleeding, contractions, leaking of fluid and fetal movement were reviewed in detail with the patient. Please refer to After Visit Summary for other counseling recommendations.   Return in about 1 week (around 03/24/2021) for OB.  Future Appointments  Date Time Provider Department Center  03/24/2021  1:45 PM Brock Bad, MD [facility] None    Gita Kudo, MD

## 2021-03-21 LAB — CULTURE, BETA STREP (GROUP B ONLY): Strep Gp B Culture: NEGATIVE

## 2021-03-22 LAB — CERVICOVAGINAL ANCILLARY ONLY
Bacterial Vaginitis (gardnerella): NEGATIVE
Candida Glabrata: NEGATIVE
Candida Vaginitis: POSITIVE — AB
Chlamydia: NEGATIVE
Comment: NEGATIVE
Comment: NEGATIVE
Comment: NEGATIVE
Comment: NEGATIVE
Comment: NEGATIVE
Comment: NORMAL
Neisseria Gonorrhea: NEGATIVE
Trichomonas: NEGATIVE

## 2021-03-23 ENCOUNTER — Other Ambulatory Visit: Payer: Self-pay

## 2021-03-23 ENCOUNTER — Encounter (HOSPITAL_COMMUNITY): Payer: Self-pay | Admitting: Family Medicine

## 2021-03-23 ENCOUNTER — Inpatient Hospital Stay (HOSPITAL_COMMUNITY)
Admission: AD | Admit: 2021-03-23 | Discharge: 2021-03-24 | Disposition: A | Discharge status: Home / Self Care | Payer: Medicaid Other | Attending: Family Medicine | Admitting: Family Medicine

## 2021-03-23 DIAGNOSIS — O4703 False labor before 37 completed weeks of gestation, third trimester: Secondary | ICD-10-CM | POA: Insufficient documentation

## 2021-03-23 DIAGNOSIS — Z0371 Encounter for suspected problem with amniotic cavity and membrane ruled out: Secondary | ICD-10-CM | POA: Insufficient documentation

## 2021-03-23 DIAGNOSIS — Z3A36 36 weeks gestation of pregnancy: Secondary | ICD-10-CM | POA: Insufficient documentation

## 2021-03-23 DIAGNOSIS — O479 False labor, unspecified: Secondary | ICD-10-CM

## 2021-03-23 MED ORDER — ACETAMINOPHEN 500 MG PO TABS
1000.0000 mg | ORAL_TABLET | Freq: Once | ORAL | Status: AC
  Administered 2021-03-23: 1000 mg via ORAL
  Filled 2021-03-23: qty 2

## 2021-03-23 NOTE — MAU Note (Signed)
..  Danielle Carter is a 25 y.o. at [redacted]w[redacted]d here in MAU reporting: abdominal cramping that comes and goes. Denies vaginal bleeding or leaking of fluid.   Pain score: 9/10

## 2021-03-24 ENCOUNTER — Ambulatory Visit (INDEPENDENT_AMBULATORY_CARE_PROVIDER_SITE_OTHER): Payer: Medicaid Other | Admitting: Obstetrics

## 2021-03-24 VITALS — BP 118/75 | HR 103 | Wt 151.4 lb

## 2021-03-24 DIAGNOSIS — O4703 False labor before 37 completed weeks of gestation, third trimester: Secondary | ICD-10-CM | POA: Diagnosis not present

## 2021-03-24 DIAGNOSIS — Z0371 Encounter for suspected problem with amniotic cavity and membrane ruled out: Secondary | ICD-10-CM | POA: Diagnosis not present

## 2021-03-24 DIAGNOSIS — Z3A36 36 weeks gestation of pregnancy: Secondary | ICD-10-CM | POA: Diagnosis not present

## 2021-03-24 DIAGNOSIS — O0990 Supervision of high risk pregnancy, unspecified, unspecified trimester: Secondary | ICD-10-CM

## 2021-03-24 DIAGNOSIS — Z8751 Personal history of pre-term labor: Secondary | ICD-10-CM

## 2021-03-24 LAB — AMNISURE RUPTURE OF MEMBRANE (ROM) NOT AT ARMC: Amnisure ROM: NEGATIVE

## 2021-03-24 NOTE — Progress Notes (Signed)
Pt reports that she is having irregular contractions, was evaluated at the hospital last night.

## 2021-03-24 NOTE — Progress Notes (Signed)
S: Danielle Carter is a 25 y.o. [phone] at [redacted]w[redacted]d  who presents to MAU today for labor evaluation.     Cervical exam by RN:  Dilation: 2 Effacement (%): 50 Cervical Position: Posterior Station: -2 Presentation: Vertex Exam by:: San Jetty, RN  Fetal Monitoring: Baseline: 130bpm Variability: moderate Accelerations: present Decelerations: absent Contractions: intermittent  MDM Discussed patient with RN. NST reviewed.   A: SIUP at [redacted]w[redacted]d  False labor  P: Discharge home Labor precautions and kick counts included in AVS Patient to follow-up with [facility] as scheduled  Patient may return to MAU as needed or when in labor   Alric Seton, MD 03/24/2021 3:27 AM

## 2021-03-25 NOTE — Progress Notes (Signed)
Subjective:  Danielle Carter is a 24 y.o. [id number] at [redacted]w[redacted]d being seen today for ongoing prenatal care.  She is currently monitored for the following issues for this high-risk pregnancy and has Low grade squamous intraepith lesion on cytologic smear cervix (lgsil); Gastric ulcer; Major depression; PTSD (post-traumatic stress disorder); History of preterm delivery; Supervision of high risk pregnancy, antepartum; Candidal skin infection; Pelvic pain affecting pregnancy in second trimester, antepartum; Pyelonephritis affecting pregnancy, antepartum; and [redacted] weeks gestation of pregnancy on their problem list.  Patient reports occasional contractions.  Contractions: Irregular.  .  Movement: Present. Denies leaking of fluid.   The following portions of the patient's history were reviewed and updated as appropriate: allergies, current medications, past family history, past medical history, past social history, past surgical history and problem list. Problem list updated.  Objective:   Vitals:   03/24/21 1358  BP: 118/75  Pulse: (!) 103  Weight: 151 lb 6.4 oz (68.7 kg)    Fetal Status:     Movement: Present     General:  Alert, oriented and cooperative. Patient is in no acute distress.  Skin: Skin is warm and dry. No rash noted.   Cardiovascular: Normal heart rate noted  Respiratory: Normal respiratory effort, no problems with respiration noted  Abdomen: Soft, gravid, appropriate for gestational age. Pain/Pressure: Present     Pelvic:  Cervical exam deferred  Extremities: Normal range of motion.  Edema: None  Mental Status: Normal mood and affect. Normal behavior. Normal judgment and thought content.   Urinalysis:      Assessment and Plan:  Pregnancy: [id number] at [redacted]w[redacted]d  There are no diagnoses linked to this encounter. Term labor symptoms and general obstetric precautions including but not limited to vaginal bleeding, contractions, leaking of fluid and fetal movement were reviewed in detail with  the patient. Please refer to After Visit Summary for other counseling recommendations.   Return in about 1 week (around 03/31/2021) for ROB.   Brock Bad, MD  6/23-2022

## 2021-03-27 ENCOUNTER — Encounter (HOSPITAL_COMMUNITY): Payer: Self-pay | Admitting: Obstetrics & Gynecology

## 2021-03-27 ENCOUNTER — Inpatient Hospital Stay (HOSPITAL_COMMUNITY)
Admission: AD | Admit: 2021-03-27 | Discharge: 2021-03-27 | Disposition: A | Discharge status: Home / Self Care | Payer: Medicaid Other | Attending: Obstetrics & Gynecology | Admitting: Obstetrics & Gynecology

## 2021-03-27 ENCOUNTER — Other Ambulatory Visit: Payer: Self-pay

## 2021-03-27 DIAGNOSIS — O479 False labor, unspecified: Secondary | ICD-10-CM

## 2021-03-27 DIAGNOSIS — Z3A37 37 weeks gestation of pregnancy: Secondary | ICD-10-CM | POA: Insufficient documentation

## 2021-03-27 DIAGNOSIS — O471 False labor at or after 37 completed weeks of gestation: Secondary | ICD-10-CM | POA: Diagnosis not present

## 2021-03-27 DIAGNOSIS — Z0371 Encounter for suspected problem with amniotic cavity and membrane ruled out: Secondary | ICD-10-CM | POA: Insufficient documentation

## 2021-03-27 LAB — AMNISURE RUPTURE OF MEMBRANE (ROM) NOT AT ARMC: Amnisure ROM: NEGATIVE

## 2021-03-27 NOTE — MAU Note (Signed)
Pt c/o of irreg UC since 11pm and leaking of fluid since 1130 last night. UC now every 3-43mins apart. Positive FM.

## 2021-03-27 NOTE — MAU Provider Note (Signed)
S: [name] is a 25 y.o. [phone] at [redacted]w[redacted]d  who presents to MAU today for labor evaluation.     Cervical exam by RN:  Dilation: 3.5 Effacement (%): 60 Cervical Position: Posterior Station: -2 Presentation: Vertex Exam by:: Marvel Plan RN  Fetal Monitoring: Baseline: 120 Variability: moderate Accelerations: multiple Decelerations: none Contractions: every 3-5 min  MDM Discussed patient with RN. NST reviewed and reactive.  A: SIUP at [redacted]w[redacted]d  False labor  P: Discharge home Labor precautions and kick counts included in AVS Patient to follow-up with Dr. Crissie Reese on 03/30/21 as scheduled  Patient may return to MAU as needed or when in labor   Sheila Oats, MD 03/27/2021 8:10 AM

## 2021-03-29 ENCOUNTER — Telehealth: Payer: Self-pay

## 2021-03-29 ENCOUNTER — Other Ambulatory Visit: Payer: Self-pay

## 2021-03-29 ENCOUNTER — Encounter (HOSPITAL_COMMUNITY): Payer: Self-pay | Admitting: Obstetrics & Gynecology

## 2021-03-29 ENCOUNTER — Inpatient Hospital Stay (HOSPITAL_COMMUNITY)
Admission: AD | Admit: 2021-03-29 | Discharge: 2021-03-29 | Disposition: A | Discharge status: Home / Self Care | Payer: Medicaid Other | Attending: Obstetrics & Gynecology | Admitting: Obstetrics & Gynecology

## 2021-03-29 ENCOUNTER — Inpatient Hospital Stay (HOSPITAL_BASED_OUTPATIENT_CLINIC_OR_DEPARTMENT_OTHER): Payer: Medicaid Other

## 2021-03-29 DIAGNOSIS — Z3A37 37 weeks gestation of pregnancy: Secondary | ICD-10-CM

## 2021-03-29 DIAGNOSIS — O471 False labor at or after 37 completed weeks of gestation: Secondary | ICD-10-CM | POA: Insufficient documentation

## 2021-03-29 DIAGNOSIS — O0990 Supervision of high risk pregnancy, unspecified, unspecified trimester: Secondary | ICD-10-CM

## 2021-03-29 DIAGNOSIS — O4290 Premature rupture of membranes, unspecified as to length of time between rupture and onset of labor, unspecified weeks of gestation: Secondary | ICD-10-CM

## 2021-03-29 DIAGNOSIS — Z0371 Encounter for suspected problem with amniotic cavity and membrane ruled out: Secondary | ICD-10-CM | POA: Diagnosis not present

## 2021-03-29 DIAGNOSIS — O42913 Preterm premature rupture of membranes, unspecified as to length of time between rupture and onset of labor, third trimester: Secondary | ICD-10-CM | POA: Diagnosis not present

## 2021-03-29 DIAGNOSIS — Z3689 Encounter for other specified antenatal screening: Secondary | ICD-10-CM

## 2021-03-29 LAB — POCT FERN TEST: POCT Fern Test: NEGATIVE

## 2021-03-29 NOTE — Telephone Encounter (Signed)
Received call from patient, she reports having some gushing of fluids that started earlier this am with some contractions every 20-30 minutes. Baby is moving well. Advised patient to go MAU asap for follow up care. Patient voices understanding at this time.

## 2021-03-29 NOTE — MAU Provider Note (Signed)
S: Danielle Carter is a 25 y.o. [phone] at [redacted]w[redacted]d  who presents to MAU today complaining of leaking of fluid since 0700. She denies vaginal bleeding. She endorses contractions. She reports normal fetal movement.    O: BP 124/69 (BP Location: Right Arm)   Pulse 84   Temp 97.9 F (36.6 C)   Resp 16   LMP 07/09/2020   SpO2 99% Comment: room air GENERAL: Well-developed, well-nourished female in no acute distress.  HEAD: Normocephalic, atraumatic.  CHEST: Normal effort of breathing, regular heart rate ABDOMEN: Soft, nontender, gravid PELVIC: Normal external female genitalia. Vagina is pink and rugated. Cervix with normal contour, no lesions. Normal discharge.  Negative pooling. Negative Valsalva. Fern Collected.  Cervical exam:  Dilation: 3.5 Effacement (%): 50 Cervical Position: Posterior Station: -3 Presentation: Vertex Exam by:: Erle Crocker, RN   Fetal Monitoring: FHT: 140 bpm, Mod Var, -Decels, +Accels Toco: Q1-54min  No results found for this or any previous visit (from the past 24 hour(s)).  [place] MFM FETAL BPP WO NON STRESS  Result Date: 03/29/2021 ----------------------------------------------------------------------  OBSTETRICS REPORT                       (Signed Final 03/29/2021 03:14 pm) ---------------------------------------------------------------------- Patient Info  ID #:       [id number]                          D.O.B.:  09/06/1996 (25 yrs)  Name:       Danielle Carter                  Visit Date: 03/29/2021 01:59 pm ---------------------------------------------------------------------- Performed By  Attending:        Noralee Space MD        Ref. Address:     Faculty Practice  Performed By:     Carollee Leitz,          Secondary Phy.:   [facility] MAU/Triage                    RDMS  Referred By:      Gerrit Heck           [facility]  ---------------------------------------------------------------------- Orders  #  Description                           Code        Ordered By  1  [place] MFM FETAL BPP WO NON               76819.01    Jearldine Cassady     STRESS ----------------------------------------------------------------------  #  Order #                     Accession #                Episode #  1  [id number]                   [id number]  [id number] ---------------------------------------------------------------------- Indications  Leakage of amniotic fluid                      O42.90  [redacted] weeks gestation of pregnancy                Z3A.37 ---------------------------------------------------------------------- Fetal Evaluation  Num Of Fetuses:         1  Fetal Heart Rate(bpm):  153  Cardiac Activity:       Observed  Presentation:           Cephalic  Placenta:               Anterior  Amniotic Fluid  AFI FV:      Within normal limits  AFI Sum(cm)     %Tile       Largest Pocket(cm)  14.6            55          5.2  RUQ(cm)       RLQ(cm)       LUQ(cm)        LLQ(cm)  5.2           4.9           2.3            2.2 ---------------------------------------------------------------------- Biophysical Evaluation  Amniotic F.V:   Within normal limits       F. Tone:        Observed  F. Movement:    Observed                   Score:          8/8  F. Breathing:   Observed ---------------------------------------------------------------------- OB History  Gravidity:    3         Term:   1        Prem:   1        SAB:   0  TOP:          0       Ectopic:  0        Living: 2 ---------------------------------------------------------------------- Gestational Age  LMP:           37w 4d        Date:  07/09/20                 EDD:   04/15/21  Best:          37w 4d     Det. By:  LMP  (07/09/20)          EDD:   04/15/21 ---------------------------------------------------------------------- Anatomy  Stomach:               Appears normal, left   Bladder:                 Appears normal                         sided  Kidneys:               Appear normal ---------------------------------------------------------------------- Impression  Patient was evaluated in the MAU for complaints of leakage  of amniotic fluid.  Amniotic fluid is normal and good fetal activity is seen  .Antenatal testing is reassuring. BPP 8/8.  Cephalic  presentation. ----------------------------------------------------------------------                  Noralee Space, MD Electronically Signed  Final Report   03/29/2021 03:14 pm ----------------------------------------------------------------------    A: SIUP at [redacted]w[redacted]d  Membranes intact Contractions  P: -Informed that vaginal exam is not highly suspicious for ROM. -Will review Fern and if negative will send for BPP. -NST Reactive.  Gerrit Heck, CNM 03/29/2021 11:45 AM  Reassessment (2:36 PM)  -BPP reviewed and returns 8/8. -Provider to bedside to discuss results. -Informed of infant well being. -Labor Precautions Given. -Patient instructed to keep appt as scheduled for tomorrow. -Encouraged to call or return to MAU if symptoms worsen or with the onset of new symptoms. -Discharged to home in stable condition.  Cherre Robins MSN, CNM Advanced Practice Provider, Center for [employer]

## 2021-03-29 NOTE — MAU Note (Signed)
Danielle Carter is a 25 y.o. at [redacted]w[redacted]d here in MAU reporting: at 0600 this AM was having irregular contractions and then noticed some LOF. Has continued to leak. Fluid is clear/yellow. Contractions got more regular after the leaking. +FM. Last IC was last night.   Onset of complaint: today  Pain score: 10/10  Vitals:   03/29/21 1023  BP: 124/69  Pulse: 84  Resp: 16  Temp: 97.9 F (36.6 C)  SpO2: 99%     FHT:142  Lab orders placed from triage: none

## 2021-03-30 ENCOUNTER — Other Ambulatory Visit: Payer: Self-pay | Admitting: Advanced Practice Midwife

## 2021-03-30 ENCOUNTER — Encounter: Payer: Self-pay | Admitting: Family Medicine

## 2021-03-30 ENCOUNTER — Ambulatory Visit (INDEPENDENT_AMBULATORY_CARE_PROVIDER_SITE_OTHER): Payer: Medicaid Other | Admitting: Family Medicine

## 2021-03-30 VITALS — BP 124/82 | HR 98 | Wt 152.6 lb

## 2021-03-30 DIAGNOSIS — Z8751 Personal history of pre-term labor: Secondary | ICD-10-CM

## 2021-03-30 DIAGNOSIS — Z3009 Encounter for other general counseling and advice on contraception: Secondary | ICD-10-CM | POA: Insufficient documentation

## 2021-03-30 DIAGNOSIS — O0990 Supervision of high risk pregnancy, unspecified, unspecified trimester: Secondary | ICD-10-CM

## 2021-03-30 NOTE — Patient Instructions (Signed)

## 2021-03-30 NOTE — Progress Notes (Signed)
   Subjective:  Danielle Carter is a 25 y.o. [id number] at [redacted]w[redacted]d being seen today for ongoing prenatal care.  She is currently monitored for the following issues for this high-risk pregnancy and has Low grade squamous intraepith lesion on cytologic smear cervix (lgsil); Gastric ulcer; Major depression; PTSD (post-traumatic stress disorder); History of preterm delivery; Supervision of high risk pregnancy, antepartum; Candidal skin infection; Pelvic pain affecting pregnancy in second trimester, antepartum; Pyelonephritis affecting pregnancy, antepartum; and [redacted] weeks gestation of pregnancy on their problem list.  Patient reports  ongoing contractions, multiple recent visits to MAU for labor checks .  Contractions: Irregular. Vag. Bleeding: None.  Movement: Present. Denies leaking of fluid.   The following portions of the patient's history were reviewed and updated as appropriate: allergies, current medications, past family history, past medical history, past social history, past surgical history and problem list. Problem list updated.  Objective:   Vitals:   03/30/21 1458  BP: 124/82  Pulse: 98  Weight: 152 lb 9.6 oz (69.2 kg)    Fetal Status: Fetal Heart Rate (bpm): 156   Movement: Present  Presentation: Vertex  General:  Alert, oriented and cooperative. Patient is in no acute distress.  Skin: Skin is warm and dry. No rash noted.   Cardiovascular: Normal heart rate noted  Respiratory: Normal respiratory effort, no problems with respiration noted  Abdomen: Soft, gravid, appropriate for gestational age. Pain/Pressure: Present     Pelvic: Vag. Bleeding: None     Cervical exam performed Dilation: 3.5 Effacement (%): 60 Station: -2  Extremities: Normal range of motion.  Edema: None  Mental Status: Normal mood and affect. Normal behavior. Normal judgment and thought content.   Urinalysis:      Assessment and Plan:  Pregnancy: G3P1102 at [redacted]w[redacted]d  1. Supervision of high risk pregnancy,  antepartum BP and FHR normal Cervix examined, unchanged from MAU yesterday, baby vertex and membranes intact Patient requests 39wk IOL, after examining schedule with scheduled for 04/09/2021 at [redacted]w[redacted]d IOL form completed, orders placed  2. History of preterm delivery At 36 wks, followed by term delivery  Term labor symptoms and general obstetric precautions including but not limited to vaginal bleeding, contractions, leaking of fluid and fetal movement were reviewed in detail with the patient. Please refer to After Visit Summary for other counseling recommendations.  Return in 1 week (on 04/06/2021) for ob visit.   Venora Maples, MD

## 2021-03-30 NOTE — Progress Notes (Signed)
ROB 37.5  Reports UC's q 5, getting stronger since this AM, breathing with UCs, back pain with UCs  Several visits to MAU for R/O labor and ROM.

## 2021-04-01 ENCOUNTER — Encounter (HOSPITAL_COMMUNITY): Payer: Self-pay | Admitting: *Deleted

## 2021-04-01 ENCOUNTER — Telehealth (HOSPITAL_COMMUNITY): Payer: Self-pay | Admitting: *Deleted

## 2021-04-01 NOTE — Telephone Encounter (Signed)
Preadmission screen  

## 2021-04-07 ENCOUNTER — Other Ambulatory Visit (HOSPITAL_COMMUNITY)
Admission: RE | Admit: 2021-04-07 | Discharge: 2021-04-07 | Disposition: A | Discharge status: Home / Self Care | Payer: Medicaid Other | Source: Ambulatory Visit | Attending: Obstetrics and Gynecology | Admitting: Obstetrics and Gynecology

## 2021-04-07 DIAGNOSIS — Z01812 Encounter for preprocedural laboratory examination: Secondary | ICD-10-CM | POA: Insufficient documentation

## 2021-04-07 DIAGNOSIS — Z20822 Contact with and (suspected) exposure to covid-19: Secondary | ICD-10-CM | POA: Diagnosis not present

## 2021-04-07 LAB — SARS CORONAVIRUS 2 (TAT 6-24 HRS): SARS Coronavirus 2: NEGATIVE

## 2021-04-08 ENCOUNTER — Ambulatory Visit (INDEPENDENT_AMBULATORY_CARE_PROVIDER_SITE_OTHER): Payer: Medicaid Other | Admitting: Nurse Practitioner

## 2021-04-08 ENCOUNTER — Other Ambulatory Visit: Payer: Self-pay

## 2021-04-08 VITALS — BP 123/83 | HR 74 | Wt 159.0 lb

## 2021-04-08 DIAGNOSIS — O0990 Supervision of high risk pregnancy, unspecified, unspecified trimester: Secondary | ICD-10-CM

## 2021-04-08 DIAGNOSIS — Z3A39 39 weeks gestation of pregnancy: Secondary | ICD-10-CM

## 2021-04-08 NOTE — Progress Notes (Signed)
39 wks ROB Request SVE

## 2021-04-08 NOTE — Progress Notes (Signed)
    Subjective:  Danielle Carter is a 25 y.o. [id number] at [redacted]w[redacted]d being seen today for ongoing prenatal care.  She is currently monitored for the following issues for this high-risk pregnancy and has Low grade squamous intraepith lesion on cytologic smear cervix (lgsil); Gastric ulcer; Major depression; PTSD (post-traumatic stress disorder); History of preterm delivery; Supervision of high risk pregnancy, antepartum; Candidal skin infection; Pelvic pain affecting pregnancy in second trimester, antepartum; Pyelonephritis affecting pregnancy, antepartum; [redacted] weeks gestation of pregnancy; and Unwanted fertility on their problem list.  Patient reports occasional contractions.  Contractions: Irritability. Vag. Bleeding: None.  Movement: Present. Denies leaking of fluid.   The following portions of the patient's history were reviewed and updated as appropriate: allergies, current medications, past family history, past medical history, past social history, past surgical history and problem list. Problem list updated.  Objective:   Vitals:   04/08/21 1127  BP: 123/83  Pulse: 74  Weight: 159 lb (72.1 kg)    Fetal Status: Fetal Heart Rate (bpm): 143   Movement: Present  Presentation: Vertex  General:  Alert, oriented and cooperative. Patient is in no acute distress.  Skin: Skin is warm and dry. No rash noted.   Cardiovascular: Normal heart rate noted  Respiratory: Normal respiratory effort, no problems with respiration noted  Abdomen: Soft, gravid, appropriate for gestational age. Pain/Pressure: Present     Pelvic:  Cervical exam performed Dilation: 3.5 Effacement (%): 80 Station: -2  Extremities: Normal range of motion.  Edema: None  Mental Status: Normal mood and affect. Normal behavior. Normal judgment and thought content.   Urinalysis:      Assessment and Plan:  Pregnancy: G3P1102 at [redacted]w[redacted]d  1. Supervision of high risk pregnancy, antepartum For induction tomorrow if does not start labor  tonight Will schedule for postpartum visit  2. [redacted] weeks gestation of pregnancy   Term labor symptoms and general obstetric precautions including but not limited to vaginal bleeding, contractions, leaking of fluid and fetal movement were reviewed in detail with the patient. Please refer to After Visit Summary for other counseling recommendations.  Return in about 5 weeks (around 05/13/2021) for postpartum visit.  Nolene Bernheim, RN, MSN, NP-BC Nurse Practitioner, [facility] for [employer], [facility] 04/08/2021 11:49 AM

## 2021-04-09 ENCOUNTER — Inpatient Hospital Stay (HOSPITAL_COMMUNITY): Payer: Medicaid Other

## 2021-04-09 ENCOUNTER — Inpatient Hospital Stay (HOSPITAL_COMMUNITY): Payer: Medicaid Other | Admitting: Anesthesiology

## 2021-04-09 ENCOUNTER — Inpatient Hospital Stay (HOSPITAL_COMMUNITY)
Admission: RE | Admit: 2021-04-09 | Discharge: 2021-04-10 | DRG: 807 | Disposition: A | Discharge status: Home / Self Care | Payer: Medicaid Other | Attending: Obstetrics & Gynecology | Admitting: Obstetrics & Gynecology

## 2021-04-09 ENCOUNTER — Encounter (HOSPITAL_COMMUNITY): Payer: Self-pay | Admitting: Family Medicine

## 2021-04-09 ENCOUNTER — Other Ambulatory Visit: Payer: Self-pay

## 2021-04-09 DIAGNOSIS — Z3A39 39 weeks gestation of pregnancy: Secondary | ICD-10-CM | POA: Diagnosis not present

## 2021-04-09 DIAGNOSIS — Z87891 Personal history of nicotine dependence: Secondary | ICD-10-CM | POA: Diagnosis not present

## 2021-04-09 DIAGNOSIS — O26893 Other specified pregnancy related conditions, third trimester: Secondary | ICD-10-CM | POA: Diagnosis not present

## 2021-04-09 DIAGNOSIS — Z3A00 Weeks of gestation of pregnancy not specified: Secondary | ICD-10-CM | POA: Diagnosis not present

## 2021-04-09 DIAGNOSIS — Z8759 Personal history of other complications of pregnancy, childbirth and the puerperium: Secondary | ICD-10-CM | POA: Insufficient documentation

## 2021-04-09 LAB — CBC
HCT: 34.3 % — ABNORMAL LOW (ref 36.0–46.0)
Hemoglobin: 10.9 g/dL — ABNORMAL LOW (ref 12.0–15.0)
MCH: 27 pg (ref 26.0–34.0)
MCHC: 31.8 g/dL (ref 30.0–36.0)
MCV: 85.1 fL (ref 80.0–100.0)
Platelets: 191 10*3/uL (ref 150–400)
RBC: 4.03 MIL/uL (ref 3.87–5.11)
RDW: 14.1 % (ref 11.5–15.5)
WBC: 8.3 10*3/uL (ref 4.0–10.5)
nRBC: 0 % (ref 0.0–0.2)

## 2021-04-09 LAB — RPR: RPR Ser Ql: NONREACTIVE

## 2021-04-09 LAB — TYPE AND SCREEN
ABO/RH(D): O POS
Antibody Screen: NEGATIVE

## 2021-04-09 MED ORDER — FENTANYL CITRATE (PF) 100 MCG/2ML IJ SOLN: 50.0000 ug | INTRAMUSCULAR | Status: DC | PRN

## 2021-04-09 MED ORDER — EPHEDRINE 5 MG/ML INJ: 10.0000 mg | INTRAVENOUS | Status: DC | PRN

## 2021-04-09 MED ORDER — LIDOCAINE HCL (PF) 1 % IJ SOLN
INTRAMUSCULAR | Status: DC | PRN
  Administered 2021-04-09: 5 mL via EPIDURAL

## 2021-04-09 MED ORDER — ONDANSETRON HCL 4 MG/2ML IJ SOLN: 4.0000 mg | INTRAMUSCULAR | Status: DC | PRN

## 2021-04-09 MED ORDER — PHENYLEPHRINE 40 MCG/ML (10ML) SYRINGE FOR IV PUSH (FOR BLOOD PRESSURE SUPPORT): 80.0000 ug | PREFILLED_SYRINGE | INTRAVENOUS | Status: DC | PRN

## 2021-04-09 MED ORDER — ONDANSETRON HCL 4 MG/2ML IJ SOLN: 4.0000 mg | Freq: Four times a day (QID) | INTRAMUSCULAR | Status: DC | PRN

## 2021-04-09 MED ORDER — ACETAMINOPHEN 325 MG PO TABS
650.0000 mg | ORAL_TABLET | ORAL | Status: DC | PRN
  Administered 2021-04-09: 650 mg via ORAL
  Filled 2021-04-09: qty 2

## 2021-04-09 MED ORDER — LACTATED RINGERS IV SOLN: 500.0000 mL | Freq: Once | INTRAVENOUS | Status: DC

## 2021-04-09 MED ORDER — TETANUS-DIPHTH-ACELL PERTUSSIS 5-2.5-18.5 LF-MCG/0.5 IM SUSY: 0.5000 mL | PREFILLED_SYRINGE | Freq: Once | INTRAMUSCULAR | Status: DC

## 2021-04-09 MED ORDER — LACTATED RINGERS IV SOLN
500.0000 mL | INTRAVENOUS | Status: DC | PRN
Start: 2021-04-09 — End: 2021-04-09

## 2021-04-09 MED ORDER — DIPHENHYDRAMINE HCL 50 MG/ML IJ SOLN
12.5000 mg | INTRAMUSCULAR | Status: DC | PRN
  Administered 2021-04-09 (×2): 12.5 mg via INTRAVENOUS
  Filled 2021-04-09: qty 1

## 2021-04-09 MED ORDER — COCONUT OIL OIL: 1.0000 "application " | TOPICAL_OIL | Status: DC | PRN

## 2021-04-09 MED ORDER — DIBUCAINE (PERIANAL) 1 % EX OINT: 1.0000 "application " | TOPICAL_OINTMENT | CUTANEOUS | Status: DC | PRN

## 2021-04-09 MED ORDER — ACETAMINOPHEN 325 MG PO TABS
650.0000 mg | ORAL_TABLET | ORAL | Status: DC | PRN
  Administered 2021-04-10: 650 mg via ORAL
  Filled 2021-04-09: qty 2

## 2021-04-09 MED ORDER — ONDANSETRON HCL 4 MG PO TABS: 4.0000 mg | ORAL_TABLET | ORAL | Status: DC | PRN

## 2021-04-09 MED ORDER — SOD CITRATE-CITRIC ACID 500-334 MG/5ML PO SOLN: 30.0000 mL | ORAL | Status: DC | PRN

## 2021-04-09 MED ORDER — FENTANYL-BUPIVACAINE-NACL 0.5-0.125-0.9 MG/250ML-% EP SOLN
12.0000 mL/h | EPIDURAL | Status: DC | PRN
  Filled 2021-04-09: qty 250

## 2021-04-09 MED ORDER — WITCH HAZEL-GLYCERIN EX PADS: 1.0000 "application " | MEDICATED_PAD | CUTANEOUS | Status: DC | PRN

## 2021-04-09 MED ORDER — LACTATED RINGERS IV SOLN: INTRAVENOUS | Status: DC

## 2021-04-09 MED ORDER — FENTANYL-BUPIVACAINE-NACL 0.5-0.125-0.9 MG/250ML-% EP SOLN
EPIDURAL | Status: DC | PRN
  Administered 2021-04-09: 12 mL/h via EPIDURAL

## 2021-04-09 MED ORDER — LIDOCAINE HCL (PF) 1 % IJ SOLN: 30.0000 mL | INTRAMUSCULAR | Status: DC | PRN

## 2021-04-09 MED ORDER — PRENATAL MULTIVITAMIN CH
1.0000 | ORAL_TABLET | Freq: Every day | ORAL | Status: DC
  Administered 2021-04-10: 1 via ORAL
  Filled 2021-04-09: qty 1

## 2021-04-09 MED ORDER — TERBUTALINE SULFATE 1 MG/ML IJ SOLN: 0.2500 mg | Freq: Once | INTRAMUSCULAR | Status: DC | PRN

## 2021-04-09 MED ORDER — MEDROXYPROGESTERONE ACETATE 150 MG/ML IM SUSP
150.0000 mg | INTRAMUSCULAR | Status: AC | PRN
  Administered 2021-04-10: 150 mg via INTRAMUSCULAR
  Filled 2021-04-09: qty 1

## 2021-04-09 MED ORDER — IBUPROFEN 600 MG PO TABS
600.0000 mg | ORAL_TABLET | Freq: Four times a day (QID) | ORAL | Status: DC
  Administered 2021-04-09 – 2021-04-10 (×4): 600 mg via ORAL
  Filled 2021-04-09 (×4): qty 1

## 2021-04-09 MED ORDER — SIMETHICONE 80 MG PO CHEW: 80.0000 mg | CHEWABLE_TABLET | ORAL | Status: DC | PRN

## 2021-04-09 MED ORDER — DIPHENHYDRAMINE HCL 25 MG PO CAPS: 25.0000 mg | ORAL_CAPSULE | Freq: Four times a day (QID) | ORAL | Status: DC | PRN

## 2021-04-09 MED ORDER — OXYTOCIN-SODIUM CHLORIDE 30-0.9 UT/500ML-% IV SOLN
1.0000 m[IU]/min | INTRAVENOUS | Status: DC
  Administered 2021-04-09: 2 m[IU]/min via INTRAVENOUS
  Filled 2021-04-09: qty 500

## 2021-04-09 MED ORDER — OXYTOCIN-SODIUM CHLORIDE 30-0.9 UT/500ML-% IV SOLN: 2.5000 [IU]/h | INTRAVENOUS | Status: DC

## 2021-04-09 MED ORDER — OXYTOCIN BOLUS FROM INFUSION
333.0000 mL | Freq: Once | INTRAVENOUS | Status: AC
  Administered 2021-04-09: 333 mL via INTRAVENOUS

## 2021-04-09 MED ORDER — MEASLES, MUMPS & RUBELLA VAC IJ SOLR: 0.5000 mL | Freq: Once | INTRAMUSCULAR | Status: DC

## 2021-04-09 MED ORDER — SENNOSIDES-DOCUSATE SODIUM 8.6-50 MG PO TABS
2.0000 | ORAL_TABLET | ORAL | Status: DC
  Administered 2021-04-09: 2 via ORAL
  Filled 2021-04-09: qty 2

## 2021-04-09 MED ORDER — BENZOCAINE-MENTHOL 20-0.5 % EX AERO
1.0000 "application " | INHALATION_SPRAY | CUTANEOUS | Status: DC | PRN
  Administered 2021-04-09: 1 via TOPICAL
  Filled 2021-04-09: qty 56

## 2021-04-09 NOTE — Anesthesia Preprocedure Evaluation (Signed)
Anesthesia Evaluation  Patient identified by MRN, date of birth, ID band Patient awake    Reviewed: Allergy & Precautions, NPO status , Patient's Chart, lab work & pertinent test results  Airway Mallampati: II  TM Distance: >3 FB Neck ROM: Full    Dental no notable dental hx. (+) Teeth Intact, Dental Advisory Given   Pulmonary former smoker,    Pulmonary exam normal breath sounds clear to auscultation       Cardiovascular Exercise Tolerance: Good Normal cardiovascular exam Rhythm:Regular Rate:Normal     Neuro/Psych    GI/Hepatic   Endo/Other  negative endocrine ROS  Renal/GU      Musculoskeletal   Abdominal   Peds  Hematology Lab Results      Component                Value               Date                      WBC                      8.3                 04/09/2021                HGB                      10.9 (L)            04/09/2021                HCT                      34.3 (L)            04/09/2021                MCV                      85.1                04/09/2021                PLT                      191                 04/09/2021              Anesthesia Other Findings   Reproductive/Obstetrics                             Anesthesia Physical Anesthesia Plan  ASA: 2  Anesthesia Plan: Epidural   Post-op Pain Management:    Induction:   PONV Risk Score and Plan:   Airway Management Planned:   Additional Equipment:   Intra-op Plan:   Post-operative Plan:   Informed Consent: I have reviewed the patients History and Physical, chart, labs and discussed the procedure including the risks, benefits and alternatives for the proposed anesthesia with the patient or authorized representative who has indicated his/her understanding and acceptance.       Plan Discussed with:   Anesthesia Plan Comments: (G3P1 for LEA)        Anesthesia Quick Evaluation

## 2021-04-09 NOTE — H&P (Signed)
OBSTETRIC ADMISSION HISTORY AND PHYSICAL  Danielle Carter is a 25 y.o. female [phone] with IUP at 64w1dpresenting for elective IOL. She reports +FMs. No LOF, VB, blurry vision, headaches, peripheral edema, or RUQ pain. She plans on breastfeeding. She requests either BTL or POPs for birth control, her husband may have vasectomy.  Dating: By LMP --->  Estimated Date of Delivery: 04/15/21  Sono:    _0 , normal anatomy, anterior placenta   Prenatal History/Complications: - pyelonephritis - hx PTD _1  - LSIL in 2020, f/u NILM  Past Medical History: Past Medical History:  Diagnosis Date   Anemia    Anxiety    Depression    going well   Headache    'occular'   Infection    UTI   PTSD (post-traumatic stress disorder)    Vaginal Pap smear, abnormal    ok since    Past Surgical History: Past Surgical History:  Procedure Laterality Date   DRUG INDUCED ENDOSCOPY      Obstetrical History: OB History     Gravida  3   Para  2   Term  1   Preterm  1   AB  0   Living  2      SAB      IAB      Ectopic      Multiple  0   Live Births  2           Social History: Social History   Socioeconomic History   Marital status: Significant Other    Spouse name: DRickard Rhymes  Number of children: 1   Years of education: Not on file   Highest education level: Not on file  Occupational History   Not on file  Tobacco Use   Smoking status: Former    Packs/day: 0.50    Pack years: 0.00    Types: Cigarettes    Quit date: 12/10/2018    Years since quitting: 2.3   Smokeless tobacco: Never  Vaping Use   Vaping Use: Never used  Substance and Sexual Activity   Alcohol use: Never   Drug use: Never   Sexual activity: Yes    Birth control/protection: None  Other Topics Concern   Not on file  Social History Narrative   Not on file   Social Determinants of Health   Financial Resource Strain: Not on file  Food Insecurity: Not on file  Transportation Needs:  Not on file  Physical Activity: Not on file  Stress: Not on file  Social Connections: Not on file    Family History: Family History  Problem Relation Age of Onset   Fibromyalgia Mother    Seizures Mother    Pancreatitis Father    GBerenice Primas disease Father    Depression Father     Allergies: Allergies  Allergen Reactions   Abilify [Aripiprazole] Other (See Comments)    Massive mood swings   Nickel Other (See Comments)    Turns green   Pristiq [Desvenlafaxine] Other (See Comments)    Massive headaches   Rocephin [Ceftriaxone Sodium In Dextrose] Hives   Latex Rash    Medications Prior to Admission  Medication Sig Dispense Refill Last Dose   acetaminophen (TYLENOL) 500 MG tablet Take 1,000 mg by mouth every 6 (six) hours as needed for mild pain or headache.      Blood Pressure Monitor KIT 1 each by Does not apply route daily. 1 each 0    cyclobenzaprine (FLEXERIL) 5 MG  tablet Take 1 tablet (5 mg total) by mouth 3 (three) times daily as needed for muscle spasms. 30 tablet 2    Misc. Devices (BREAST PUMP) MISC 1 Device by Does not apply route daily. 1 each 0    NIFEdipine (PROCARDIA-XL/NIFEDICAL-XL) 30 MG 24 hr tablet Take 1 tablet (30 mg total) by mouth daily. Can increase to twice a day as needed for symptomatic contractions (Patient not taking: Reported on 03/30/2021) 30 tablet 2    Prenatal Vit-Fe Fumarate-FA (PRENATAL MULTIVITAMIN) TABS tablet Take 1 tablet by mouth daily at 12 noon.      Review of Systems:  All systems reviewed and negative except as stated in HPI  PE: Last menstrual period 07/09/2020, unknown if currently breastfeeding. General appearance: alert, cooperative, and no distress Lungs: regular rate and effort Heart: regular rate  Abdomen: soft, non-tender Extremities: Homans sign is negative, no sign of DVT Presentation: cephalic EFM: 811 bpm, mod variability, + accels, no decels Toco: 7-10 SVE: 5/80/-2, vtx; membranes swept w/consent  Prenatal  labs: ABO, Rh: --/--/O POS (04/05 1953) Antibody: NEG (04/05 1953) Rubella: 1.91 (01/25 1556) RPR: Non Reactive (04/21 1042)  HBsAg: Negative (01/25 1556)  HIV: Non Reactive (04/21 1042)  GBS: Negative/-- (06/16 0437)  2 hr GTT normal  Prenatal Transfer Tool  Maternal Diabetes: No Genetic Screening: Normal Maternal Ultrasounds/Referrals: Normal Fetal Ultrasounds or other Referrals:  None Maternal Substance Abuse:  No Significant Maternal Medications:  None Significant Maternal Lab Results: Group B Strep negative  No results found for this or any previous visit (from the past 24 hour(s)).  Patient Active Problem List   Diagnosis Date Noted   Unwanted fertility 03/30/2021   [redacted] weeks gestation of pregnancy 01/20/2021   Pyelonephritis affecting pregnancy, antepartum 01/04/2021   Candidal skin infection 11/23/2020   Pelvic pain affecting pregnancy in second trimester, antepartum 11/23/2020   Supervision of high risk pregnancy, antepartum 09/29/2020   History of preterm delivery 09/15/2019   Low grade squamous intraepith lesion on cytologic smear cervix (lgsil) 02/26/2019   Gastric ulcer 10/15/2013   Major depression 10/15/2013   PTSD (post-traumatic stress disorder) 10/15/2013    Assessment: Danielle Carter is a 25 y.o. G3P1102 at 65w1dhere for eIOL  1. Labor: latent 2. FWB: Cat I 3. GBS: neg   Plan: Admit to LD Pitocin IOL Analgesia/anesthesia prn Anticipate SVD  MJulianne Handler CNM  04/09/2021, 11:17 AM

## 2021-04-09 NOTE — Anesthesia Procedure Notes (Signed)
Epidural Patient location during procedure: OB Start time: 04/09/2021 1:10 PM End time: 04/09/2021 1:22 PM  Staffing Anesthesiologist: Trevor Iha, MD Performed: anesthesiologist   Preanesthetic Checklist Completed: patient identified, IV checked, site marked, risks and benefits discussed, surgical consent, monitors and equipment checked, pre-op evaluation and timeout performed  Epidural Patient position: sitting Prep: DuraPrep and site prepped and draped Patient monitoring: continuous pulse ox and blood pressure Approach: midline Location: L3-L4 Injection technique: LOR air  Needle:  Needle type: Tuohy  Needle gauge: 17 G Needle length: 9 cm and 9 Needle insertion depth: 5 cm cm Catheter type: closed end flexible Catheter size: 19 Gauge Catheter at skin depth: 11 cm Test dose: negative  Assessment Events: blood not aspirated, injection not painful, no injection resistance, no paresthesia and negative IV test  Additional Notes Patient identified. Risks/Benefits/Options discussed with patient including but not limited to bleeding, infection, nerve damage, paralysis, failed block, incomplete pain control, headache, blood pressure changes, nausea, vomiting, reactions to medication both or allergic, itching and postpartum back pain. Confirmed with bedside nurse the patient's most recent platelet count. Confirmed with patient that they are not currently taking any anticoagulation, have any bleeding history or any family history of bleeding disorders. Patient expressed understanding and wished to proceed. All questions were answered. Sterile technique was used throughout the entire procedure. Please see nursing notes for vital signs. Test dose was given through epidural needle and negative prior to continuing to dose epidural or start infusion. Warning signs of high block given to the patient including shortness of breath, tingling/numbness in hands, complete motor block, or any  concerning symptoms with instructions to call for help. Patient was given instructions on fall risk and not to get out of bed. All questions and concerns addressed with instructions to call with any issues.  1 Attempt (S) . Patient tolerated procedure well.

## 2021-04-09 NOTE — Lactation Note (Signed)
This note was copied from a baby's chart. Lactation Consultation Note  Patient Name: Danielle Carter [id number] Date: 04/09/2021 Reason for consult: L&D Initial assessment;Term Age:23 hours  Mom states she BF her first but her milk supply went away after 1.5 months.  She desires to try to breastfeed and states when her baby latched early, it wasn't completely comfortable.   Mom is aware lactation will follow up on MBU.    Maternal Data Does the patient have breastfeeding experience prior to this delivery?: Yes How long did the patient breastfeed?: 1.5 months with her almost 2 yr. old son  Feeding Mother's Current Feeding Choice: Breast Milk  LATCH Score Latch: Grasps breast easily, tongue down, lips flanged, rhythmical sucking.  Audible Swallowing: Spontaneous and intermittent  Type of Nipple: Everted at rest and after stimulation  Comfort (Breast/Nipple): Soft / non-tender  Hold (Positioning): No assistance needed to correctly position infant at breast.  LATCH Score: 10   Lactation Tools Discussed/Used    Interventions Interventions: Skin to skin  Discharge    Consult Status Consult Status: Follow-up Date: 04/10/21 Follow-up type: In-patient    Maryruth Hancock [facility] 04/09/2021, 6:45 PM

## 2021-04-09 NOTE — Discharge Summary (Signed)
Postpartum Discharge Summary    Patient Name: Danielle Carter DOB: 1996/04/02 MRN: [id number]  Date of admission: 04/09/2021 Delivery date:04/09/2021  Delivering provider: Julianne Handler  Date of discharge: 04/10/2021  Admitting diagnosis: Encounter for elective induction of labor [Z34.90] Intrauterine pregnancy: [redacted]w[redacted]d     Secondary diagnosis:  Active Problems:   SVD (spontaneous vaginal delivery)  Additional problems: hx of SD, hx of pyelonephritis, hx PTD    Discharge diagnosis: Term Pregnancy Delivered                                              Post partum procedures: depo Augmentation: Pitocin Complications: None  Hospital course: Induction of Labor With Vaginal Delivery   25 y.o. yo [id number] at [redacted]w[redacted]d was admitted to the hospital 04/09/2021 for induction of labor.  Indication for induction: Elective.  Patient had an uncomplicated labor course as follows: Membrane Rupture Time/Date: 4:30 PM ,04/09/2021   Delivery Method:Vaginal, Spontaneous  Episiotomy: None  Lacerations:  Labial  Details of delivery can be found in separate delivery note.  Patient had a routine postpartum course. Patient is discharged home 04/10/21.  Newborn Data: Birth date:04/09/2021  Birth time:5:20 PM  Gender:Female  Living status:Living  Apgars:6 ,9  Weight:3609 g   Magnesium Sulfate received: No BMZ received: No Rhophylac:N/A MMR:N/A T-DaP:Given prenatally Flu: Yes Transfusion:No  Physical exam  Vitals:   04/09/21 1925 04/09/21 2047 04/10/21 0035 04/10/21 0508  BP: 128/80 112/64 (!) 117/49 121/74  Pulse: (!) 57 (!) 55 65 60  Resp: $Remo'20 20 18 18  'bnEEy$ Temp: 98.4 F (36.9 C) 97.7 F (36.5 C) 97.9 F (36.6 C) 97.7 F (36.5 C)  TempSrc: Oral Oral Oral Oral  SpO2: 100% 100% 98% 99%   General: alert, cooperative, and no distress Lochia: appropriate Uterine Fundus: firm Incision: N/A DVT Evaluation: No evidence of DVT seen on physical exam. No cords or calf tenderness. No significant calf/ankle  edema. Labs: Lab Results  Component Value Date   WBC 8.3 04/09/2021   HGB 10.9 (L) 04/09/2021   HCT 34.3 (L) 04/09/2021   MCV 85.1 04/09/2021   PLT 191 04/09/2021   CMP Latest Ref Rng & Units 05/21/2020  Glucose 70 - 99 mg/dL 89  BUN 6 - 20 mg/dL 9  Creatinine 0.44 - 1.00 mg/dL 0.75  Sodium 135 - 145 mmol/L 140  Potassium 3.5 - 5.1 mmol/L 3.8  Chloride 98 - 111 mmol/L 107  CO2 22 - 32 mmol/L 26  Calcium 8.9 - 10.3 mg/dL 9.2  Total Protein 6.5 - 8.1 g/dL 7.0  Total Bilirubin 0.3 - 1.2 mg/dL 0.5  Alkaline Phos 38 - 126 U/L 63  AST 15 - 41 U/L 14(L)  ALT 0 - 44 U/L 12   Edinburgh Score: Edinburgh Postnatal Depression Scale Screening Tool 04/10/2021  I have been able to laugh and see the funny side of things. 0  I have looked forward with enjoyment to things. 0  I have blamed myself unnecessarily when things went wrong. 1  I have been anxious or worried for no good reason. 1  I have felt scared or panicky for no good reason. 0  Things have been getting on top of me. 0  I have been so unhappy that I have had difficulty sleeping. 0  I have felt sad or miserable. 0  I have been so unhappy that  I have been crying. 0  The thought of harming myself has occurred to me. 0  Edinburgh Postnatal Depression Scale Total 2     After visit meds:  Allergies as of 04/10/2021       Reactions   Abilify [aripiprazole] Other (See Comments)   Massive mood swings   Nickel Other (See Comments)   Turns green   Pristiq [desvenlafaxine] Other (See Comments)   Massive headaches   Rocephin [ceftriaxone Sodium In Dextrose] Hives   Latex Rash        Medication List     STOP taking these medications    Blood Pressure Monitor Kit   Breast Pump Misc   cyclobenzaprine 5 MG tablet Commonly known as: FLEXERIL   NIFEdipine 30 MG 24 hr tablet Commonly known as: PROCARDIA-XL/NIFEDICAL-XL       TAKE these medications    acetaminophen 500 MG tablet Commonly known as: TYLENOL Take 1,000  mg by mouth every 6 (six) hours as needed for mild pain or headache.   coconut oil Oil Apply 1 application topically as needed (nipple pain).   ibuprofen 600 MG tablet Commonly known as: ADVIL Take 1 tablet (600 mg total) by mouth every 6 (six) hours.   prenatal multivitamin Tabs tablet Take 1 tablet by mouth daily at 12 noon.         Discharge home in stable condition Infant Feeding: Breast Infant Disposition:home with mother Discharge instruction: per After Visit Summary and Postpartum booklet. Activity: Advance as tolerated. Pelvic rest for 6 weeks.  Diet: routine diet Future Appointments:No future appointments. Follow up Visit:  Please schedule this patient for Postpartum visit in: 6 weeks with the following provider: Any provider Virtual For C/S patients schedule nurse incision check in weeks 2 weeks: no Low risk pregnancy complicated by: none Delivery mode:  SVD Anticipated Birth Control:   PP Depo given PP Procedures needed: none  Schedule Integrated BH visit: no  Randa Ngo, MD OB Fellow, Faculty [facility] 04/10/2021 9:39 AM

## 2021-04-09 NOTE — Lactation Note (Signed)
This note was copied from a baby's chart. Lactation Consultation Note  Patient Name: Danielle Carter Date: 04/09/2021 Reason for consult: L&D Initial assessment;Initial assessment;Term Age:23 hours  Mom states she feels at 1.5 months into breastfeeding she lost her supply.  She is able to hand express and colostrum comes to the nipple.  She really  desires to breastfeed successfully longer than with the last child per mom.  Breast changes during pregnancy areola darkened, increased in size, and became more sensitive.  Infant has strong suck, high palate, and  gumline felt on finger during oral assessment.  Infant latched in laid back poistion with LC assistance for 15 minutes.  Mom felt slight discomfort and adjustments were made to bring infant in closer to the breast and widen gape.  A few swallows were heard.  Infant fell asleep and was swaddled.    Mom goes to [place] and was encouraged to see their LC or call [facility] to schedule with [facility] for follow up as mom wants to make sure she is getting the support needed .  Mom has the phone number for [facility], our phone line, and is aware of BFSG.    Maternal Data Has patient been taught Hand Expression?: Yes Does the patient have breastfeeding experience prior to this delivery?: Yes (1.5 months with almost 23 year old and she pumped 1 month with 39 year old NICU baby) How long did the patient breastfeed?: 1.5 months with her almost 2 yr. old son  Feeding Mother's Current Feeding Choice: Breast Milk  LATCH Score Latch: Grasps breast easily, tongue down, lips flanged, rhythmical sucking.  Audible Swallowing: A few with stimulation  Type of Nipple: Everted at rest and after stimulation  Comfort (Breast/Nipple): Filling, red/small blisters or bruises, mild/mod discomfort (moms feels slight pinching)  Hold (Positioning): Assistance needed to correctly position infant at breast and maintain latch.  LATCH Score:  7   Lactation Tools Discussed/Used    Interventions Interventions: Skin to skin  Discharge Pump: Personal (pump in style medela) WIC Program: Yes ([place])  Consult Status Consult Status: Follow-up Date: 04/10/21 Follow-up type: In-patient    Maryruth Hancock [facility] 04/09/2021, 8:31 PM

## 2021-04-09 NOTE — Progress Notes (Signed)
Labor Progress Note Danielle Carter is a 25 y.o. [id number] at [redacted]w[redacted]d presented for eIOL  S:  Comfortable with epidural. Having some itching, just had Benadryl.  O:  BP 111/69   Pulse (!) 58   Resp 18   LMP 07/09/2020   Breastfeeding Unknown  EFM: baseline 125 bpm/ mod variability/ + accels/ no decels  Toco/IUPC: 2-5 SVE: deferred Pitocin: 4 mu/min  A/P: 25 y.o. Q4B2010 [redacted]w[redacted]d  1. Labor: latent 2. FWB: Cat I 3. Pain: epidural   Continue Pitocin. Anticipate labor progress and SVD.  Donette Larry, CNM 3:17 PM

## 2021-04-10 ENCOUNTER — Encounter (HOSPITAL_COMMUNITY): Payer: Self-pay | Admitting: Family Medicine

## 2021-04-10 MED ORDER — COCONUT OIL OIL: 1.0000 "application " | TOPICAL_OIL | 0 refills | Status: DC | PRN

## 2021-04-10 MED ORDER — IBUPROFEN 600 MG PO TABS
600.0000 mg | ORAL_TABLET | Freq: Four times a day (QID) | ORAL | 0 refills | Status: DC
Start: 2021-04-10 — End: 2021-09-19

## 2021-04-10 NOTE — Anesthesia Postprocedure Evaluation (Signed)
Anesthesia Post Note  Patient: Danielle Carter  Procedure(s) Performed: AN AD HOC LABOR EPIDURAL     Patient location during evaluation: Mother Baby Anesthesia Type: Epidural Level of consciousness: awake Pain management: satisfactory to patient Vital Signs Assessment: post-procedure vital signs reviewed and stable Respiratory status: spontaneous breathing Cardiovascular status: stable Anesthetic complications: no   No notable events documented.  Last Vitals:  Vitals:   04/10/21 0035 04/10/21 0508  BP: (!) 117/49 121/74  Pulse: 65 60  Resp: 18 18  Temp: 36.6 C 36.5 C  SpO2: 98% 99%    Last Pain:  Vitals:   04/10/21 0508  TempSrc: Oral  PainSc: 6    Pain Goal: Patients Stated Pain Goal: 2 (04/10/21 6468)                 Cephus Shelling

## 2021-04-10 NOTE — Lactation Note (Signed)
This note was copied from a baby's chart. Lactation Consultation Note  Patient Name: Danielle Carter [id number] Date: 04/10/2021 Reason for consult: Follow-up assessment;Term;Infant weight loss;Other (Comment) (baby stuffy/ comgested / improved when latched) Age:23 hours Per mom has been breast feeding in the football on the left breast and the cradle on the right. Baby seems like he is chomping when latching.  Mom needed instructions on the the cross cradle position and worked on obtaining depth. Latch score 9 - see below. Stuffiness clearing with 22 mins feeding.  Mom mentioned she is having nipple tenderness. LC assessed both with feedings , and noted both to be pink, no breakdown, areola has some edema,  LC reviewed hand expressing and encouraged mom to use her EBM to her nipples often. See below for breast feeding tools that will help prevent soreness and breakdown. See D/C teaching below for BF.    Maternal Data Has patient been taught Hand Expression?: Yes  Feeding Mother's Current Feeding Choice: Breast Milk  LATCH Score Latch: Grasps breast easily, tongue down, lips flanged, rhythmical sucking.  Audible Swallowing: Spontaneous and intermittent  Type of Nipple: Everted at rest and after stimulation  Comfort (Breast/Nipple): Soft / non-tender  Hold (Positioning): Assistance needed to correctly position infant at breast and maintain latch.  LATCH Score: 9   Lactation Tools Discussed/Used Tools: Shells;Pump;Flanges Flange Size: 24;27 Breast pump type: Manual Pump Education: Milk Storage;Setup, frequency, and cleaning  Interventions Interventions: Breast feeding basics reviewed;Assisted with latch;Skin to skin;Breast massage;Breast compression;Adjust position;Support pillows;Position options;Shells;Hand pump;Comfort gels  Discharge Discharge Education: Engorgement and breast care;Warning signs for feeding baby Pump: Personal;Manual  Consult Status Consult Status:  Complete Date: 04/10/21 Follow-up type: In-patient    Matilde Sprang Cally Nygard 04/10/2021, 2:07 PM

## 2021-04-11 ENCOUNTER — Telehealth: Payer: Self-pay

## 2021-04-11 DIAGNOSIS — Z9189 Other specified personal risk factors, not elsewhere classified: Secondary | ICD-10-CM

## 2021-04-11 NOTE — Telephone Encounter (Signed)
Transition Care Management Follow-up Telephone Call Date of discharge and from where: 04/10/2021-[facility] How have you been since you were released from the hospital? Doing Fine  Any questions or concerns? No  Items Reviewed: Did the pt receive and understand the discharge instructions provided? Yes  Medications obtained and verified? Yes  Other? No  Any new allergies since your discharge? No  Dietary orders reviewed? No Do you have support at home? Yes   Home Care and Equipment/Supplies: Were home health services ordered? not applicable If so, what is the name of the agency? N/A  Has the agency set up a time to come to the patient's home? not applicable Were any new equipment or medical supplies ordered?  No What is the name of the medical supply agency? N/A Were you able to get the supplies/equipment? not applicable Do you have any questions related to the use of the equipment or supplies? No  Functional Questionnaire: (I = Independent and D = Dependent) ADLs: I  Bathing/Dressing- I  Meal Prep- I  Eating- I  Maintaining continence- I  Transferring/Ambulation- I  Managing Meds- I  Follow up appointments reviewed:  PCP Hospital f/u appt confirmed? No  Placing Referral for PCP. Specialist Hospital f/u appt confirmed? Yes  Scheduled to see Dr. Courtney Paris on 05/23/2021 @ 2:10 PM. Are transportation arrangements needed? No  If their condition worsens, is the pt aware to call PCP or go to the Emergency Dept.? Yes Was the patient provided with contact information for the PCP's office or ED? Yes-ED Was to pt encouraged to call back with questions or concerns? Yes

## 2021-04-21 ENCOUNTER — Telehealth (HOSPITAL_COMMUNITY): Payer: Self-pay | Admitting: *Deleted

## 2021-04-21 NOTE — Telephone Encounter (Signed)
Attempted to reach patient for Hospital Discharge Follow-Up Call.  Patient's voice mail box was full and I was unable to leave a message.

## 2021-05-23 ENCOUNTER — Ambulatory Visit: Payer: Medicaid Other | Admitting: Advanced Practice Midwife

## 2021-06-08 ENCOUNTER — Other Ambulatory Visit: Payer: Self-pay

## 2021-06-08 ENCOUNTER — Emergency Department (HOSPITAL_BASED_OUTPATIENT_CLINIC_OR_DEPARTMENT_OTHER): Payer: Medicaid Other

## 2021-06-08 ENCOUNTER — Inpatient Hospital Stay (HOSPITAL_BASED_OUTPATIENT_CLINIC_OR_DEPARTMENT_OTHER)
Admission: EM | Admit: 2021-06-08 | Discharge: 2021-06-11 | DRG: 871 | Disposition: A | Discharge status: Home / Self Care | Payer: Medicaid Other | Attending: Internal Medicine | Admitting: Internal Medicine

## 2021-06-08 ENCOUNTER — Encounter (HOSPITAL_BASED_OUTPATIENT_CLINIC_OR_DEPARTMENT_OTHER): Payer: Self-pay | Admitting: *Deleted

## 2021-06-08 DIAGNOSIS — E872 Acidosis, unspecified: Secondary | ICD-10-CM | POA: Diagnosis present

## 2021-06-08 DIAGNOSIS — A4151 Sepsis due to Escherichia coli [E. coli]: Principal | ICD-10-CM | POA: Diagnosis present

## 2021-06-08 DIAGNOSIS — N390 Urinary tract infection, site not specified: Secondary | ICD-10-CM | POA: Diagnosis not present

## 2021-06-08 DIAGNOSIS — R509 Fever, unspecified: Secondary | ICD-10-CM | POA: Diagnosis not present

## 2021-06-08 DIAGNOSIS — E876 Hypokalemia: Secondary | ICD-10-CM | POA: Diagnosis not present

## 2021-06-08 DIAGNOSIS — F4310 Post-traumatic stress disorder, unspecified: Secondary | ICD-10-CM | POA: Diagnosis not present

## 2021-06-08 DIAGNOSIS — R1110 Vomiting, unspecified: Secondary | ICD-10-CM | POA: Diagnosis not present

## 2021-06-08 DIAGNOSIS — K5900 Constipation, unspecified: Secondary | ICD-10-CM | POA: Diagnosis present

## 2021-06-08 DIAGNOSIS — N12 Tubulo-interstitial nephritis, not specified as acute or chronic: Secondary | ICD-10-CM | POA: Diagnosis present

## 2021-06-08 DIAGNOSIS — Z881 Allergy status to other antibiotic agents status: Secondary | ICD-10-CM

## 2021-06-08 DIAGNOSIS — N179 Acute kidney failure, unspecified: Secondary | ICD-10-CM | POA: Diagnosis not present

## 2021-06-08 DIAGNOSIS — R079 Chest pain, unspecified: Secondary | ICD-10-CM | POA: Diagnosis present

## 2021-06-08 DIAGNOSIS — U071 COVID-19: Secondary | ICD-10-CM | POA: Diagnosis not present

## 2021-06-08 DIAGNOSIS — Z87891 Personal history of nicotine dependence: Secondary | ICD-10-CM | POA: Diagnosis not present

## 2021-06-08 DIAGNOSIS — R6520 Severe sepsis without septic shock: Secondary | ICD-10-CM | POA: Diagnosis not present

## 2021-06-08 DIAGNOSIS — R109 Unspecified abdominal pain: Secondary | ICD-10-CM | POA: Diagnosis not present

## 2021-06-08 DIAGNOSIS — A419 Sepsis, unspecified organism: Secondary | ICD-10-CM | POA: Diagnosis not present

## 2021-06-08 DIAGNOSIS — E869 Volume depletion, unspecified: Secondary | ICD-10-CM | POA: Diagnosis not present

## 2021-06-08 LAB — CBC
HCT: 38 % (ref 36.0–46.0)
Hemoglobin: 12.8 g/dL (ref 12.0–15.0)
MCH: 25.9 pg — ABNORMAL LOW (ref 26.0–34.0)
MCHC: 33.7 g/dL (ref 30.0–36.0)
MCV: 76.8 fL — ABNORMAL LOW (ref 80.0–100.0)
Platelets: 155 10*3/uL (ref 150–400)
RBC: 4.95 MIL/uL (ref 3.87–5.11)
RDW: 15.3 % (ref 11.5–15.5)
WBC: 11.8 10*3/uL — ABNORMAL HIGH (ref 4.0–10.5)
nRBC: 0 % (ref 0.0–0.2)

## 2021-06-08 LAB — COMPREHENSIVE METABOLIC PANEL
ALT: 12 U/L (ref 0–44)
AST: 25 U/L (ref 15–41)
Albumin: 4 g/dL (ref 3.5–5.0)
Alkaline Phosphatase: 56 U/L (ref 38–126)
Anion gap: 19 — ABNORMAL HIGH (ref 5–15)
BUN: 38 mg/dL — ABNORMAL HIGH (ref 6–20)
CO2: 19 mmol/L — ABNORMAL LOW (ref 22–32)
Calcium: 8.4 mg/dL — ABNORMAL LOW (ref 8.9–10.3)
Chloride: 101 mmol/L (ref 98–111)
Creatinine, Ser: 3.29 mg/dL — ABNORMAL HIGH (ref 0.44–1.00)
GFR, Estimated: 19 mL/min — ABNORMAL LOW (ref >60)
Glucose, Bld: 124 mg/dL — ABNORMAL HIGH (ref 70–99)
Potassium: 3.1 mmol/L — ABNORMAL LOW (ref 3.5–5.1)
Sodium: 139 mmol/L (ref 135–145)
Total Bilirubin: 0.6 mg/dL (ref 0.3–1.2)
Total Protein: 7.7 g/dL (ref 6.5–8.1)

## 2021-06-08 LAB — URINALYSIS, ROUTINE W REFLEX MICROSCOPIC
Bilirubin Urine: NEGATIVE
Glucose, UA: NEGATIVE mg/dL
Ketones, ur: NEGATIVE mg/dL
Nitrite: NEGATIVE
Protein, ur: >300 mg/dL — AB
Specific Gravity, Urine: 1.017 (ref 1.005–1.030)
pH: 6 (ref 5.0–8.0)

## 2021-06-08 LAB — PREGNANCY, URINE: Preg Test, Ur: NEGATIVE

## 2021-06-08 LAB — LACTIC ACID, PLASMA
Lactic Acid, Venous: 1.2 mmol/L (ref 0.5–1.9)
Lactic Acid, Venous: 2.8 mmol/L (ref 0.5–1.9)

## 2021-06-08 LAB — RESP PANEL BY RT-PCR (FLU A&B, COVID) ARPGX2
Influenza A by PCR: NEGATIVE
Influenza B by PCR: NEGATIVE
SARS Coronavirus 2 by RT PCR: POSITIVE — AB

## 2021-06-08 LAB — LIPASE, BLOOD: Lipase: 44 U/L (ref 11–51)

## 2021-06-08 LAB — MRSA NEXT GEN BY PCR, NASAL: MRSA by PCR Next Gen: NOT DETECTED

## 2021-06-08 MED ORDER — LACTATED RINGERS IV SOLN: INTRAVENOUS | Status: DC

## 2021-06-08 MED ORDER — ACETAMINOPHEN 325 MG PO TABS
650.0000 mg | ORAL_TABLET | Freq: Once | ORAL | Status: AC
  Administered 2021-06-08: 650 mg via ORAL
  Filled 2021-06-08: qty 2

## 2021-06-08 MED ORDER — FENTANYL CITRATE (PF) 100 MCG/2ML IJ SOLN
50.0000 ug | INTRAMUSCULAR | Status: DC | PRN
Start: 2021-06-08 — End: 2021-06-11
  Administered 2021-06-08 – 2021-06-11 (×5): 50 ug via INTRAVENOUS
  Filled 2021-06-08 (×6): qty 2

## 2021-06-08 MED ORDER — LACTATED RINGERS IV BOLUS (SEPSIS)
1000.0000 mL | Freq: Once | INTRAVENOUS | Status: AC
  Administered 2021-06-08: 1000 mL via INTRAVENOUS

## 2021-06-08 MED ORDER — DEXTROSE 5 % IV BOLUS: 1000.0000 mL | Freq: Once | INTRAVENOUS | Status: DC

## 2021-06-08 MED ORDER — VANCOMYCIN HCL IN DEXTROSE 1-5 GM/200ML-% IV SOLN
1000.0000 mg | Freq: Once | INTRAVENOUS | Status: AC
  Administered 2021-06-08: 1000 mg via INTRAVENOUS
  Filled 2021-06-08: qty 200

## 2021-06-08 MED ORDER — VANCOMYCIN VARIABLE DOSE PER UNSTABLE RENAL FUNCTION (PHARMACIST DOSING)
Status: DC
  Filled 2021-06-08: qty 1

## 2021-06-08 MED ORDER — SODIUM CHLORIDE 0.9 % IV SOLN
2.0000 g | Freq: Once | INTRAVENOUS | Status: AC
  Administered 2021-06-08: 2 g via INTRAVENOUS

## 2021-06-08 MED ORDER — MORPHINE SULFATE (PF) 2 MG/ML IV SOLN
2.0000 mg | Freq: Once | INTRAVENOUS | Status: AC
  Administered 2021-06-08: 2 mg via INTRAVENOUS
  Filled 2021-06-08: qty 1

## 2021-06-08 MED ORDER — LACTATED RINGERS IV BOLUS
1000.0000 mL | Freq: Once | INTRAVENOUS | Status: AC
  Administered 2021-06-08: 1000 mL via INTRAVENOUS

## 2021-06-08 MED ORDER — CHLORHEXIDINE GLUCONATE CLOTH 2 % EX PADS
6.0000 | MEDICATED_PAD | Freq: Every day | CUTANEOUS | Status: DC
  Administered 2021-06-09 – 2021-06-10 (×2): 6 via TOPICAL

## 2021-06-08 MED ORDER — METRONIDAZOLE 500 MG/100ML IV SOLN
500.0000 mg | Freq: Once | INTRAVENOUS | Status: AC
  Administered 2021-06-08: 500 mg via INTRAVENOUS
  Filled 2021-06-08: qty 100

## 2021-06-08 MED ORDER — FENTANYL CITRATE (PF) 100 MCG/2ML IJ SOLN
25.0000 ug | INTRAMUSCULAR | Status: DC | PRN
  Administered 2021-06-09 (×2): 25 ug via INTRAVENOUS
  Filled 2021-06-08 (×2): qty 2

## 2021-06-08 MED ORDER — DEXTROSE 5 % IV BOLUS
1000.0000 mL | Freq: Once | INTRAVENOUS | Status: AC
  Administered 2021-06-08: 1000 mL via INTRAVENOUS

## 2021-06-08 MED ORDER — DIPHENHYDRAMINE HCL 50 MG/ML IJ SOLN
25.0000 mg | Freq: Once | INTRAMUSCULAR | Status: AC
  Administered 2021-06-08: 25 mg via INTRAVENOUS

## 2021-06-08 MED ORDER — ALUM & MAG HYDROXIDE-SIMETH 200-200-20 MG/5ML PO SUSP
30.0000 mL | Freq: Once | ORAL | Status: AC
  Administered 2021-06-08: 30 mL via ORAL
  Filled 2021-06-08: qty 30

## 2021-06-08 MED ORDER — ONDANSETRON HCL 4 MG/2ML IJ SOLN
4.0000 mg | Freq: Once | INTRAMUSCULAR | Status: AC
  Administered 2021-06-08: 4 mg via INTRAVENOUS
  Filled 2021-06-08: qty 2

## 2021-06-08 NOTE — Progress Notes (Signed)
Admitted to room 1228, admission line paged.

## 2021-06-08 NOTE — ED Triage Notes (Signed)
Fever 103, vomiting, abdominal pain and constipation for 4 days.

## 2021-06-08 NOTE — ED Notes (Signed)
Pt resting with eyes closed. Awakens easily. Pt states right upper arm discomfort form medication reaction has resolved. Pt husband at bedside. Pt denies any needs at this time. Call bell in reach.

## 2021-06-08 NOTE — Sepsis Progress Note (Signed)
ELink is tracking the code sepsis. 

## 2021-06-08 NOTE — Progress Notes (Addendum)
Pharmacy Antibiotic Note  Danielle Carter is a 25 y.o. female admitted on 06/08/2021 with possible sepsis vs intra-abdominal infection  Discussed with MD, infection of unknown origin and will treat with broad spectrum antibiotics. Pharmacy has been consulted for vancomycin and aztreonam dosing.   WBC 11.8, Tmax 103F SCr - 3.29 (not her BL)  Plan: Metronidazole dosing per MD Aztreonam 1000 mg IV x1 Vancomycin 1000 mg IV x1  Variable vancomycin dosing based on levels. Goal trough 15-20 mcg/mL Monitor renal function, cultures, clinical status and de-escalate as able Investigate cephalosporin allergy  Height: 5\' 4"  (162.6 cm) Weight: 59 kg (130 lb) IBW/kg (Calculated) : 54.7  Temp (24hrs), Avg:101.4 F (38.6 C), Min:99.5 F (37.5 C), Max:103 F (39.4 C)  Recent Labs  Lab 06/08/21 1558 06/08/21 1639  WBC 11.8*  --   CREATININE 3.29*  --   LATICACIDVEN  --  1.2    Estimated Creatinine Clearance: 23 mL/min (A) (by C-G formula based on SCr of 3.29 mg/dL (H)).    Allergies  Allergen Reactions   Abilify [Aripiprazole] Other (See Comments)    Massive mood swings   Nickel Other (See Comments)    Turns green   Pristiq [Desvenlafaxine] Other (See Comments)    Massive headaches   Rocephin [Ceftriaxone Sodium In Dextrose] Hives   Latex Rash    Antimicrobials this admission: Aztreonam 9/7 >>  Vancomycin 9/7 >>  Metronidazole 9/7 >>  Dose adjustments this admission: 9/7 - Aztreonam 2 gm > 1 gm every 8 hours for CrCl 23 mL/min  Microbiology results: 9/7 BCx: pending 9/7 UCx: pending  9/7 COVID: positive  Thank you for allowing pharmacy to be a part of this patient's care.  11/7, PharmD PGY1 Pharmacy Resident 06/08/2021  5:59 PM  Please check [email or web address] for unit-specific pharmacy phone numbers.

## 2021-06-08 NOTE — ED Provider Notes (Signed)
[facility] EMERGENCY DEPT Provider Note   CSN: [id number] Arrival date & time: 06/08/21  1350     History Chief Complaint  Patient presents with   Fever   Emesis   Abdominal Pain   Constipation    Neville N Rennels is a 25 y.o. female.  HPI 25 yo female g3p3, postpartum 2 months, presents today with fever 4 days, with associated nausea and vomiting, abdominal pain, no diarrhea.  Some nasal congestion and sore throat, feels this is due to vomiting.  Patient has not kept anything down for 4 days and is still breastfeeding but has had difficulty producing enough mild.  Patient had covid last January, no vaccine.  Denies uti symptoms or vaginal discharge, but has some brownish d/c one time with intense vomitine.  No BM for 4 days.  No one else sick in home. One child had some minor uri symptoms. LMP August 24 since delivery      Past Medical History:  Diagnosis Date   Anemia    Anxiety    Depression    going well   Headache    'occular'   Infection    UTI   PTSD (post-traumatic stress disorder)    Vaginal Pap smear, abnormal    ok since    Patient Active Problem List   Diagnosis Date Noted   History of shoulder dystocia in prior pregnancy 04/09/2021   SVD (spontaneous vaginal delivery) 04/09/2021   Unwanted fertility 03/30/2021   Pyelonephritis affecting pregnancy, antepartum 01/04/2021   Candidal skin infection 11/23/2020   Pelvic pain affecting pregnancy in second trimester, antepartum 11/23/2020   Supervision of high risk pregnancy, antepartum 09/29/2020   History of preterm delivery 09/15/2019   Low grade squamous intraepith lesion on cytologic smear cervix (lgsil) 02/26/2019   Gastric ulcer 10/15/2013   Major depression 10/15/2013   PTSD (post-traumatic stress disorder) 10/15/2013    Past Surgical History:  Procedure Laterality Date   DRUG INDUCED ENDOSCOPY       OB History     Gravida  3   Para  2   Term  1   Preterm  1   AB  0    Living  2      SAB      IAB      Ectopic      Multiple  0   Live Births  2           Family History  Problem Relation Age of Onset   Fibromyalgia Mother    Seizures Mother    Pancreatitis Father    Luiz Blare' disease Father    Depression Father     Social History   Tobacco Use   Smoking status: Former    Packs/day: 0.50    Types: Cigarettes    Quit date: 12/10/2018    Years since quitting: 2.4   Smokeless tobacco: Never  Vaping Use   Vaping Use: Never used  Substance Use Topics   Alcohol use: Never   Drug use: Never    Home Medications Prior to Admission medications   Medication Sig Start Date End Date Taking? Authorizing Provider  acetaminophen (TYLENOL) 500 MG tablet Take 1,000 mg by mouth every 6 (six) hours as needed for mild pain or headache.   Yes [provider]  ibuprofen (ADVIL) 600 MG tablet Take 1 tablet (600 mg total) by mouth every 6 (six) hours. 04/10/21  Yes Sheila Oats, MD  coconut oil OIL Apply 1 application  topically as needed (nipple pain). 04/10/21   Sheila OatsGoswick, Anna E, MD    Allergies    Abilify [aripiprazole], Nickel, Pristiq [desvenlafaxine], Rocephin [ceftriaxone sodium in dextrose], and Latex  Review of Systems   Review of Systems  Physical Exam Updated Vital Signs BP (!) 92/57 (BP Location: Left Arm)   Pulse 96   Temp 99.7 F (37.6 C) (Oral)   Resp 18   Ht 1.626 m (5\' 4" )   Wt 59 kg   LMP 05/25/2021   SpO2 100%   BMI 22.31 kg/m   Physical Exam Vitals and nursing note reviewed.  Constitutional:      General: She is not in acute distress.    Appearance: She is well-developed. She is ill-appearing.  HENT:     Head: Normocephalic.     Mouth/Throat:     Pharynx: Oropharynx is clear.  Eyes:     Extraocular Movements: Extraocular movements intact.  Cardiovascular:     Rate and Rhythm: Tachycardia present.     Heart sounds: Normal heart sounds.  Pulmonary:     Effort: Pulmonary effort is normal.     Breath  sounds: Normal breath sounds.  Abdominal:     General: Abdomen is flat. Bowel sounds are decreased.     Palpations: Abdomen is soft.     Tenderness: There is generalized abdominal tenderness. There is right CVA tenderness and left CVA tenderness.     Hernia: No hernia is present.  Skin:    General: Skin is warm and dry.     Capillary Refill: Capillary refill takes less than 2 seconds.  Neurological:     General: No focal deficit present.     Mental Status: She is alert.  Psychiatric:        Mood and Affect: Mood normal.        Behavior: Behavior normal.    ED Results / Procedures / Treatments   Labs (all labs ordered are listed, but only abnormal results are displayed) Labs Reviewed  RESP PANEL BY RT-PCR (FLU A&B, COVID) ARPGX2 - Abnormal; Notable for the following components:      Result Value   SARS Coronavirus 2 by RT PCR POSITIVE (*)    All other components within normal limits  URINALYSIS, ROUTINE W REFLEX MICROSCOPIC - Abnormal; Notable for the following components:   APPearance HAZY (*)    Hgb urine dipstick MODERATE (*)    Protein, ur >300 (*)    Leukocytes,Ua LARGE (*)    Bacteria, UA MANY (*)    All other components within normal limits  CBC - Abnormal; Notable for the following components:   WBC 11.8 (*)    MCV 76.8 (*)    MCH 25.9 (*)    All other components within normal limits  COMPREHENSIVE METABOLIC PANEL - Abnormal; Notable for the following components:   Potassium 3.1 (*)    CO2 19 (*)    Glucose, Bld 124 (*)    BUN 38 (*)    Creatinine, Ser 3.29 (*)    Calcium 8.4 (*)    GFR, Estimated 19 (*)    Anion gap 19 (*)    All other components within normal limits  CULTURE, BLOOD (ROUTINE X 2)  CULTURE, BLOOD (ROUTINE X 2)  URINE CULTURE  PREGNANCY, URINE  LACTIC ACID, PLASMA  LIPASE, BLOOD  LACTIC ACID, PLASMA    EKG None  Radiology CT ABDOMEN PELVIS WO CONTRAST  Result Date: 06/08/2021 CLINICAL DATA:  Fever and abdominal pain. Vomiting. COVID  positive today. EXAM: CT ABDOMEN AND PELVIS WITHOUT CONTRAST TECHNIQUE: Multidetector CT imaging of the abdomen and pelvis was performed following the standard protocol without IV contrast. COMPARISON:  Contrast-enhanced CT 05/22/2020 FINDINGS: Lower chest: The lung bases are clear. Hepatobiliary: Minimal focal fatty infiltration adjacent to the falciform ligament. No evidence of focal liver lesion. Unremarkable gallbladder. No biliary dilatation. Pancreas: No ductal dilatation or inflammation. Spleen: Normal in size without focal abnormality. Adrenals/Urinary Tract: Moderate right perinephric edema with edematous appearance of the right kidney. No hydronephrosis. No renal or ureteral calculi. Unremarkable noncontrast appearance of the left kidney. No left renal stones. Urinary bladder is minimally distended and not well assessed. No bladder stone. No adrenal nodule. Stomach/Bowel: Stomach is decompressed. There is no small bowel obstruction or inflammation. Normal appendix. Small to moderate colonic stool burden. No colonic Simonian thickening or inflammation. Vascular/Lymphatic: Normal caliber abdominal aorta. No portal venous or mesenteric gas. There are prominent retroperitoneal lymph nodes are likely reactive. Reproductive: Unremarkable noncontrast CT appearance of the uterus. Tampon in the vagina. Quiescent ovaries. Other: No free air or free fluid. Musculoskeletal: There are no acute or suspicious osseous abnormalities. IMPRESSION: 1. Moderate right perinephric edema with edematous appearance of the right kidney, suspicious for pyelonephritis. No renal stones or obstructive uropathy. 2. Prominent retroperitoneal lymph nodes are likely reactive. Electronically Signed   By: Narda Rutherford M.D.   On: 06/08/2021 18:04   DG Chest Port 1 View  Result Date: 06/08/2021 CLINICAL DATA:  Sepsis.  Fever.  Abdominal pain and vomiting. EXAM: PORTABLE CHEST 1 VIEW COMPARISON:  None. FINDINGS: The cardiomediastinal contours  are normal. The lungs are clear. Pulmonary vasculature is normal. No consolidation, pleural effusion, or pneumothorax. No acute osseous abnormalities are seen. IMPRESSION: Negative AP view of the chest. Electronically Signed   By: Narda Rutherford M.D.   On: 06/08/2021 18:06    Procedures Procedures   Medications Ordered in ED Medications  lactated ringers bolus 1,000 mL (has no administration in time range)  lactated ringers infusion (has no administration in time range)  aztreonam (AZACTAM) 2 g in sodium chloride 0.9 % 100 mL IVPB (2 g Intravenous New Bag/Given 06/08/21 1806)  metroNIDAZOLE (FLAGYL) IVPB 500 mg (has no administration in time range)  vancomycin (VANCOCIN) IVPB 1000 mg/200 mL premix (has no administration in time range)  lactated ringers bolus 1,000 mL (has no administration in time range)    And  lactated ringers bolus 1,000 mL (1,000 mLs Intravenous New Bag/Given 06/08/21 1820)  acetaminophen (TYLENOL) tablet 650 mg (650 mg Oral Given 06/08/21 1642)  dextrose 5 % bolus 1,000 mL (0 mLs Intravenous Stopped 06/08/21 1819)  morphine 2 MG/ML injection 2 mg (2 mg Intravenous Given 06/08/21 1643)  ondansetron (ZOFRAN) injection 4 mg (4 mg Intravenous Given 06/08/21 1643)  diphenhydrAMINE (BENADRYL) injection 25 mg (25 mg Intravenous Given 06/08/21 1801)    ED Course  I have reviewed the triage vital signs and the nursing notes.  Pertinent labs & imaging results that were available during my care of the patient were reviewed by me and considered in my medical decision making (see chart for details).    MDM Rules/Calculators/A&P                           25 year old female G3, P3 postpartum 2 months, breast-feeding, previously healthy, presents today complaining of fever, abdominal pain, nausea and vomiting and inability to tolerate fluids for 4 days. 1- Covid positive 2- AKI-  creatinine 3.29 3- sepsis- first lactic normal, repeat pending, tachycardia, hypotension 4- probably  pyelonephritis-+positive cva ttp, 21-50 leukocytes, urine being cultured  Patient given broad spectrum abx and IV fluids Discussed with critical care, Dr. , states patient can go to step down on hospitalist service and they will consult. Discussed with Dr. Rachael Darby who is excepted the patient to [facility]  Final Clinical Impression(s) / ED Diagnoses Final diagnoses:  Sepsis with acute renal failure and septic shock, due to unspecified organism, unspecified acute renal failure type [facility])  COVID    Rx / DC Orders ED Discharge Orders     None        Margarita Grizzle, MD 06/08/21 2036

## 2021-06-08 NOTE — Sepsis Progress Note (Signed)
Notified bedside nurse of need to administer fluid bolus.  

## 2021-06-08 NOTE — ED Notes (Signed)
Called [facility] to transport patient to [facility] ICU 1228

## 2021-06-08 NOTE — ED Notes (Signed)
In patient room to start antibiotic when pt complained of discomfort on right upper arm. Notable rased rash on right upper arm. d5 hanging and paused. Dr. Rosalia Hammers made aware and benadryl ordered. Pt has no other areas of reaction and no difficulty breathing. Denies any known allergies but did stay that her father had an allergy to morphine. 2nd iv started.

## 2021-06-09 ENCOUNTER — Inpatient Hospital Stay (HOSPITAL_COMMUNITY): Payer: Medicaid Other

## 2021-06-09 ENCOUNTER — Encounter: Payer: Medicaid Other | Admitting: Obstetrics and Gynecology

## 2021-06-09 ENCOUNTER — Telehealth: Payer: Medicaid Other | Admitting: Obstetrics and Gynecology

## 2021-06-09 ENCOUNTER — Encounter (HOSPITAL_COMMUNITY): Payer: Self-pay | Admitting: Family Medicine

## 2021-06-09 DIAGNOSIS — Z87891 Personal history of nicotine dependence: Secondary | ICD-10-CM | POA: Diagnosis not present

## 2021-06-09 DIAGNOSIS — A4151 Sepsis due to Escherichia coli [E. coli]: Secondary | ICD-10-CM | POA: Diagnosis not present

## 2021-06-09 DIAGNOSIS — Z881 Allergy status to other antibiotic agents status: Secondary | ICD-10-CM | POA: Diagnosis not present

## 2021-06-09 DIAGNOSIS — E876 Hypokalemia: Secondary | ICD-10-CM | POA: Diagnosis present

## 2021-06-09 DIAGNOSIS — E869 Volume depletion, unspecified: Secondary | ICD-10-CM | POA: Diagnosis not present

## 2021-06-09 DIAGNOSIS — E872 Acidosis, unspecified: Secondary | ICD-10-CM | POA: Diagnosis present

## 2021-06-09 DIAGNOSIS — F4310 Post-traumatic stress disorder, unspecified: Secondary | ICD-10-CM | POA: Diagnosis not present

## 2021-06-09 DIAGNOSIS — K5900 Constipation, unspecified: Secondary | ICD-10-CM | POA: Diagnosis not present

## 2021-06-09 DIAGNOSIS — A419 Sepsis, unspecified organism: Secondary | ICD-10-CM | POA: Diagnosis present

## 2021-06-09 DIAGNOSIS — N179 Acute kidney failure, unspecified: Secondary | ICD-10-CM | POA: Diagnosis present

## 2021-06-09 DIAGNOSIS — N390 Urinary tract infection, site not specified: Secondary | ICD-10-CM | POA: Diagnosis not present

## 2021-06-09 DIAGNOSIS — U071 COVID-19: Secondary | ICD-10-CM | POA: Diagnosis present

## 2021-06-09 DIAGNOSIS — R079 Chest pain, unspecified: Secondary | ICD-10-CM | POA: Diagnosis not present

## 2021-06-09 DIAGNOSIS — N12 Tubulo-interstitial nephritis, not specified as acute or chronic: Secondary | ICD-10-CM | POA: Diagnosis not present

## 2021-06-09 DIAGNOSIS — R0602 Shortness of breath: Secondary | ICD-10-CM | POA: Diagnosis not present

## 2021-06-09 LAB — RENAL FUNCTION PANEL
Albumin: 2.5 g/dL — ABNORMAL LOW (ref 3.5–5.0)
Anion gap: 10 (ref 5–15)
BUN: 36 mg/dL — ABNORMAL HIGH (ref 6–20)
CO2: 22 mmol/L (ref 22–32)
Calcium: 7 mg/dL — ABNORMAL LOW (ref 8.9–10.3)
Chloride: 107 mmol/L (ref 98–111)
Creatinine, Ser: 2.48 mg/dL — ABNORMAL HIGH (ref 0.44–1.00)
GFR, Estimated: 27 mL/min — ABNORMAL LOW (ref >60)
Glucose, Bld: 108 mg/dL — ABNORMAL HIGH (ref 70–99)
Phosphorus: 2.8 mg/dL (ref 2.5–4.6)
Potassium: 2.8 mmol/L — ABNORMAL LOW (ref 3.5–5.1)
Sodium: 139 mmol/L (ref 135–145)

## 2021-06-09 LAB — CBC WITH DIFFERENTIAL/PLATELET
Abs Immature Granulocytes: 0.07 10*3/uL (ref 0.00–0.07)
Basophils Absolute: 0 10*3/uL (ref 0.0–0.1)
Basophils Relative: 0 %
Eosinophils Absolute: 0 10*3/uL (ref 0.0–0.5)
Eosinophils Relative: 0 %
HCT: 26.6 % — ABNORMAL LOW (ref 36.0–46.0)
Hemoglobin: 9 g/dL — ABNORMAL LOW (ref 12.0–15.0)
Immature Granulocytes: 1 %
Lymphocytes Relative: 12 %
Lymphs Abs: 0.7 10*3/uL (ref 0.7–4.0)
MCH: 26.2 pg (ref 26.0–34.0)
MCHC: 33.8 g/dL (ref 30.0–36.0)
MCV: 77.6 fL — ABNORMAL LOW (ref 80.0–100.0)
Monocytes Absolute: 0.5 10*3/uL (ref 0.1–1.0)
Monocytes Relative: 8 %
Neutro Abs: 4.7 10*3/uL (ref 1.7–7.7)
Neutrophils Relative %: 79 %
Platelets: 85 10*3/uL — ABNORMAL LOW (ref 150–400)
RBC: 3.43 MIL/uL — ABNORMAL LOW (ref 3.87–5.11)
RDW: 15.7 % — ABNORMAL HIGH (ref 11.5–15.5)
WBC: 6 10*3/uL (ref 4.0–10.5)
nRBC: 0 % (ref 0.0–0.2)

## 2021-06-09 LAB — LACTIC ACID, PLASMA: Lactic Acid, Venous: 1.2 mmol/L (ref 0.5–1.9)

## 2021-06-09 LAB — TYPE AND SCREEN
ABO/RH(D): O POS
Antibody Screen: NEGATIVE

## 2021-06-09 LAB — PROTIME-INR
INR: 1.3 — ABNORMAL HIGH (ref 0.8–1.2)
Prothrombin Time: 15.7 seconds — ABNORMAL HIGH (ref 11.4–15.2)

## 2021-06-09 LAB — APTT: aPTT: 36 seconds (ref 24–36)

## 2021-06-09 LAB — CORTISOL: Cortisol, Plasma: 26.7 ug/dL

## 2021-06-09 LAB — D-DIMER, QUANTITATIVE: D-Dimer, Quant: 3.1 ug/mL-FEU — ABNORMAL HIGH (ref 0.00–0.50)

## 2021-06-09 LAB — MAGNESIUM: Magnesium: 1.1 mg/dL — ABNORMAL LOW (ref 1.7–2.4)

## 2021-06-09 LAB — CK: Total CK: 181 U/L (ref 38–234)

## 2021-06-09 MED ORDER — TECHNETIUM TO 99M ALBUMIN AGGREGATED
4.3000 | Freq: Once | INTRAVENOUS | Status: AC
  Administered 2021-06-09: 4.3 via INTRAVENOUS

## 2021-06-09 MED ORDER — LACTATED RINGERS IV BOLUS
1000.0000 mL | Freq: Once | INTRAVENOUS | Status: AC
  Administered 2021-06-09: 1000 mL via INTRAVENOUS

## 2021-06-09 MED ORDER — ONDANSETRON HCL 4 MG PO TABS: 4.0000 mg | ORAL_TABLET | Freq: Four times a day (QID) | ORAL | Status: DC | PRN

## 2021-06-09 MED ORDER — ENOXAPARIN SODIUM 30 MG/0.3ML IJ SOSY
30.0000 mg | PREFILLED_SYRINGE | INTRAMUSCULAR | Status: DC
  Administered 2021-06-09 – 2021-06-10 (×2): 30 mg via SUBCUTANEOUS
  Filled 2021-06-09 (×2): qty 0.3

## 2021-06-09 MED ORDER — PIPERACILLIN-TAZOBACTAM 3.375 G IVPB
3.3750 g | Freq: Three times a day (TID) | INTRAVENOUS | Status: AC
  Administered 2021-06-09 – 2021-06-10 (×5): 3.375 g via INTRAVENOUS
  Filled 2021-06-09 (×6): qty 50

## 2021-06-09 MED ORDER — LACTATED RINGERS IV SOLN: INTRAVENOUS | Status: DC

## 2021-06-09 MED ORDER — ACETAMINOPHEN 325 MG PO TABS
650.0000 mg | ORAL_TABLET | Freq: Four times a day (QID) | ORAL | Status: DC | PRN
  Administered 2021-06-09 – 2021-06-10 (×3): 650 mg via ORAL
  Filled 2021-06-09 (×3): qty 2

## 2021-06-09 MED ORDER — KCL-LACTATED RINGERS 20 MEQ/L IV SOLN
INTRAVENOUS | Status: DC
  Filled 2021-06-09: qty 1000

## 2021-06-09 MED ORDER — POLYETHYLENE GLYCOL 3350 17 G PO PACK: 17.0000 g | PACK | Freq: Every day | ORAL | Status: DC | PRN

## 2021-06-09 MED ORDER — ONDANSETRON HCL 4 MG/2ML IJ SOLN: 4.0000 mg | Freq: Four times a day (QID) | INTRAMUSCULAR | Status: DC | PRN

## 2021-06-09 MED ORDER — LACTATED RINGERS IV BOLUS: 500.0000 mL | Freq: Once | INTRAVENOUS | Status: DC

## 2021-06-09 MED ORDER — POTASSIUM CHLORIDE 2 MEQ/ML IV SOLN
INTRAVENOUS | Status: AC
  Filled 2021-06-09 (×2): qty 1000

## 2021-06-09 MED ORDER — POTASSIUM CHLORIDE CRYS ER 20 MEQ PO TBCR
40.0000 meq | EXTENDED_RELEASE_TABLET | ORAL | Status: AC
Start: 2021-06-09 — End: 2021-06-09
  Administered 2021-06-09 (×2): 40 meq via ORAL
  Filled 2021-06-09 (×2): qty 2

## 2021-06-09 MED ORDER — ACETAMINOPHEN 650 MG RE SUPP: 650.0000 mg | Freq: Four times a day (QID) | RECTAL | Status: DC | PRN

## 2021-06-09 MED ORDER — KCL-LACTATED RINGERS 20 MEQ/L IV SOLN
INTRAVENOUS | Status: DC
  Filled 2021-06-09 (×2): qty 1000

## 2021-06-09 MED ORDER — POTASSIUM CHLORIDE 2 MEQ/ML IV SOLN
INTRAVENOUS | Status: DC
  Filled 2021-06-09: qty 1000

## 2021-06-09 MED ORDER — MAGNESIUM SULFATE 4 GM/100ML IV SOLN
4.0000 g | Freq: Once | INTRAVENOUS | Status: AC
  Administered 2021-06-09: 4 g via INTRAVENOUS
  Filled 2021-06-09: qty 100

## 2021-06-09 NOTE — Progress Notes (Signed)
Pharmacy Antibiotic Note  Danielle Carter is a 25 y.o. female admitted on 06/08/2021 with n/v, abdominal pain, fever x 4 days.  Pt is postpartum 2 months..  Pharmacy has been consulted to dose zosyn for sepsis.  Plan: Zosyn 3.375g IV q8h (4 hour infusion). Follow renal function and clinical course  Height: 5\' 4"  (162.6 cm) Weight: 64 kg (141 lb 1.5 oz) IBW/kg (Calculated) : 54.7  Temp (24hrs), Avg:100.6 F (38.1 C), Min:98.5 F (36.9 C), Max:103 F (39.4 C)  Recent Labs  Lab 06/08/21 1558 06/08/21 1639 06/08/21 1844  WBC 11.8*  --   --   CREATININE 3.29*  --   --   LATICACIDVEN  --  1.2 2.8*    Estimated Creatinine Clearance: 23 mL/min (A) (by C-G formula based on SCr of 3.29 mg/dL (H)).    Allergies  Allergen Reactions   Abilify [Aripiprazole] Other (See Comments)    Massive mood swings   Nickel Other (See Comments)    Turns green   Pristiq [Desvenlafaxine] Other (See Comments)    Massive headaches   Rocephin [Ceftriaxone Sodium In Dextrose] Hives   Latex Rash    Thank you for allowing pharmacy to be a part of this patient's care. 08/08/21 RPh 06/09/2021, 2:08 AM

## 2021-06-09 NOTE — H&P (Signed)
History and Physical    Danielle Carter [id number]:[id number] DOB: 26-Feb-1996 DOA: 06/08/2021  PCP: Pcp, No  Patient coming from: Home via [facility] ED   Chief Complaint:  Chief Complaint  Patient presents with   Fever   Emesis   Abdominal Pain   Constipation     HPI:    25 year old g3p3 postpartum 2 months female with past medical history of depression, anxiety presenting to med [facility] emergency department with a several day history of nausea vomiting and abdominal pain.  Patient explains that over this past weekend she began to experience fevers as high as 103 F.  This is associated with pain in the left upper quadrant and left chest "under my ribs."  She describes this as sharp in quality, severe in intensity, worse with deep inspiration or movement and improved with rest.  Patient also began to develop generalized weakness poor appetite, bouts of nausea and vomiting with inability to tolerate oral intake and generalized muscle aches over the span of time.  Of note patient denies dysuria or hematuria but does note that her urine has been "cloudy" as of late.  Upon further questioning patient denies recent travel, sick contacts or contact with confirmed COVID-19 infection.  Patient does report having been diagnosed with COVID last January.  Patient eventually presented with the symptoms to med [facility] emergency department for evaluation.  Upon evaluation in the emergency department patient was found to exhibit multiple SIRS criteria including tachycardia, tachypnea, leukocytosis with an associated acute kidney injury and urinalysis suggestive of urinary tract infection all suggestive of sepsis.  Patient was found to be COVID-positive although this was felt to be incidental as patient did not exhibit any oxygen requirement.  CT imaging the abdomen and pelvis was performed revealing moderate right perinephric edema with edematous appearance of the right kidney suspicious for  pyelonephritis without evidence of postobstructive uropathy or stone.  Patient was initiated on intravenous Zosyn for antibiotic therapy and was provided with 2 L of intravenous lactated ringer boluses.  Due to persisting hypotension despite fluid boluses the hospitalist group was called and the patient was accepted to [facility] long stepdown unit for further evaluation and management.  Review of Systems:   Review of Systems  Constitutional:  Positive for fever and malaise/fatigue.  Cardiovascular:  Positive for chest pain.  Gastrointestinal:  Positive for abdominal pain, nausea and vomiting.  Musculoskeletal:  Positive for back pain.  Neurological:  Positive for focal weakness.  All other systems reviewed and are negative.  Past Medical History:  Diagnosis Date   Anemia    Anxiety    Depression    going well   Headache    'occular'   Infection    UTI   PTSD (post-traumatic stress disorder)    Vaginal Pap smear, abnormal    ok since    Past Surgical History:  Procedure Laterality Date   DRUG INDUCED ENDOSCOPY       reports that she quit smoking about 2 years ago. Her smoking use included cigarettes. She smoked an average of .5 packs per day. She has never used smokeless tobacco. She reports that she does not drink alcohol and does not use drugs.  Allergies  Allergen Reactions   Abilify [Aripiprazole] Other (See Comments)    Massive mood swings   Nickel Other (See Comments)    Turns green   Pristiq [Desvenlafaxine] Other (See Comments)    Massive headaches   Rocephin [Ceftriaxone Sodium In Dextrose]  Hives   Latex Rash    Family History  Problem Relation Age of Onset   Fibromyalgia Mother    Seizures Mother    Pancreatitis Father    Luiz BlareGraves' disease Father    Depression Father      Prior to Admission medications   Medication Sig Start Date End Date Taking? Authorizing Provider  acetaminophen (TYLENOL) 500 MG tablet Take 1,000 mg by mouth every 6 (six) hours as  needed for mild pain or headache.   Yes [provider]  coconut oil OIL Apply 1 application topically as needed (nipple pain). 04/10/21  Yes Sheila OatsGoswick, Anna E, MD  ibuprofen (ADVIL) 600 MG tablet Take 1 tablet (600 mg total) by mouth every 6 (six) hours. 04/10/21  Yes Sheila OatsGoswick, Anna E, MD  cyclobenzaprine (FLEXERIL) 5 MG tablet Take 5 mg by mouth 3 (three) times daily as needed. 04/10/21   [provider]    Physical Exam: Vitals:   06/09/21 0530 06/09/21 0600 06/09/21 0700 06/09/21 0800  BP: (!) 87/45 (!) 92/45 (!) 87/46 (!) 98/53  Pulse: 98 97 100 (!) 107  Resp: (!) 22 (!) 21 20 (!) 25  Temp: 98.8 F (37.1 C)     TempSrc: Axillary     SpO2: 100% 97% 99% 98%  Weight:      Height:        Constitutional: Awake alert and oriented x3, no associated distress.   Skin: no rashes, no lesions, notable poor skin turgor.   Eyes: Pupils are equally reactive to light.  No evidence of scleral icterus or conjunctival pallor.  ENMT: Dry mucous membranes noted.  Posterior pharynx clear of any exudate or lesions.   Neck: normal, supple, no masses, no thyromegaly.  No evidence of jugular venous distension.   Respiratory: clear to auscultation bilaterally, no wheezing, no crackles.  Increased respiratory effort without accessory muscle use.  Cardiovascular: Tachycardic rate with regular rhythm no murmurs / rubs / gallops. No extremity edema. 2+ pedal pulses. No carotid bruits.  Chest:   Diffusely tender without crepitus or deformity.   Back:   Diffusely tender without crepitus or deformity. Abdomen: Generalized abdominal tenderness.  Abdomen is soft however.  Abdomen is soft and nontender.  No evidence of intra-abdominal masses.  Positive bowel sounds noted in all quadrants.   Musculoskeletal: All extremities are diffusely tender without associated deformity.  No joint deformity upper and lower extremities. Good ROM, no contractures. Normal muscle tone.  Neurologic: CN 2-12 grossly intact.  Sensation intact.  Patient moving all 4 extremities spontaneously.  Patient is following all commands.  Patient is responsive to verbal stimuli.   Psychiatric: Patient exhibits normal mood with appropriate affect.  Patient seems to possess insight as to their current situation.     Labs on Admission: I have personally reviewed following labs and imaging studies -   CBC: Recent Labs  Lab 06/08/21 1558 06/09/21 0249  WBC 11.8* 6.0  NEUTROABS  --  4.7  HGB 12.8 9.0*  HCT 38.0 26.6*  MCV 76.8* 77.6*  PLT 155 85*   Basic Metabolic Panel: Recent Labs  Lab 06/08/21 1558 06/09/21 0249  NA 139 139  K 3.1* 2.8*  CL 101 107  CO2 19* 22  GLUCOSE 124* 108*  BUN 38* 36*  CREATININE 3.29* 2.48*  CALCIUM 8.4* 7.0*  MG  --  1.1*  PHOS  --  2.8   GFR: Estimated Creatinine Clearance: 30.5 mL/min (A) (by C-G formula based on SCr of 2.48 mg/dL (  H)). Liver Function Tests: Recent Labs  Lab 06/08/21 1558 06/09/21 0249  AST 25  --   ALT 12  --   ALKPHOS 56  --   BILITOT 0.6  --   PROT 7.7  --   ALBUMIN 4.0 2.5*   Recent Labs  Lab 06/08/21 1558  LIPASE 44   No results for input(s): AMMONIA in the last 168 hours. Coagulation Profile: Recent Labs  Lab 06/09/21 0249  INR 1.3*   Cardiac Enzymes: No results for input(s): CKTOTAL, CKMB, CKMBINDEX, TROPONINI in the last 168 hours. BNP (last 3 results) No results for input(s): PROBNP in the last 8760 hours. HbA1C: No results for input(s): HGBA1C in the last 72 hours. CBG: No results for input(s): GLUCAP in the last 168 hours. Lipid Profile: No results for input(s): CHOL, HDL, LDLCALC, TRIG, CHOLHDL, LDLDIRECT in the last 72 hours. Thyroid Function Tests: No results for input(s): TSH, T4TOTAL, FREET4, T3FREE, THYROIDAB in the last 72 hours. Anemia Panel: No results for input(s): VITAMINB12, FOLATE, FERRITIN, TIBC, IRON, RETICCTPCT in the last 72 hours. Urine analysis:    Component Value Date/Time   COLORURINE YELLOW  06/08/2021 1454   APPEARANCEUR HAZY (A) 06/08/2021 1454   LABSPEC 1.017 06/08/2021 1454   PHURINE 6.0 06/08/2021 1454   GLUCOSEU NEGATIVE 06/08/2021 1454   HGBUR MODERATE (A) 06/08/2021 1454   BILIRUBINUR NEGATIVE 06/08/2021 1454   KETONESUR NEGATIVE 06/08/2021 1454   PROTEINUR >300 (A) 06/08/2021 1454   NITRITE NEGATIVE 06/08/2021 1454   LEUKOCYTESUR LARGE (A) 06/08/2021 1454    Radiological Exams on Admission - Personally Reviewed: CT ABDOMEN PELVIS WO CONTRAST  Result Date: 06/08/2021 CLINICAL DATA:  Fever and abdominal pain. Vomiting. COVID positive today. EXAM: CT ABDOMEN AND PELVIS WITHOUT CONTRAST TECHNIQUE: Multidetector CT imaging of the abdomen and pelvis was performed following the standard protocol without IV contrast. COMPARISON:  Contrast-enhanced CT 05/22/2020 FINDINGS: Lower chest: The lung bases are clear. Hepatobiliary: Minimal focal fatty infiltration adjacent to the falciform ligament. No evidence of focal liver lesion. Unremarkable gallbladder. No biliary dilatation. Pancreas: No ductal dilatation or inflammation. Spleen: Normal in size without focal abnormality. Adrenals/Urinary Tract: Moderate right perinephric edema with edematous appearance of the right kidney. No hydronephrosis. No renal or ureteral calculi. Unremarkable noncontrast appearance of the left kidney. No left renal stones. Urinary bladder is minimally distended and not well assessed. No bladder stone. No adrenal nodule. Stomach/Bowel: Stomach is decompressed. There is no small bowel obstruction or inflammation. Normal appendix. Small to moderate colonic stool burden. No colonic [place] thickening or inflammation. Vascular/Lymphatic: Normal caliber abdominal aorta. No portal venous or mesenteric gas. There are prominent retroperitoneal lymph nodes are likely reactive. Reproductive: Unremarkable noncontrast CT appearance of the uterus. Tampon in the vagina. Quiescent ovaries. Other: No free air or free fluid.  Musculoskeletal: There are no acute or suspicious osseous abnormalities. IMPRESSION: 1. Moderate right perinephric edema with edematous appearance of the right kidney, suspicious for pyelonephritis. No renal stones or obstructive uropathy. 2. Prominent retroperitoneal lymph nodes are likely reactive. Electronically Signed   By: Narda Rutherford M.D.   On: 06/08/2021 18:04   DG Chest Port 1 View  Result Date: 06/08/2021 CLINICAL DATA:  Sepsis.  Fever.  Abdominal pain and vomiting. EXAM: PORTABLE CHEST 1 VIEW COMPARISON:  None. FINDINGS: The cardiomediastinal contours are normal. The lungs are clear. Pulmonary vasculature is normal. No consolidation, pleural effusion, or pneumothorax. No acute osseous abnormalities are seen. IMPRESSION: Negative AP view of the chest. Electronically Signed   By:  Narda Rutherford M.D.   On: 06/08/2021 18:06    Telemetry: Personally reviewed.  Rhythm is sinus tachycardia 110 bpm.  Assessment/Plan Principal Problem:   Sepsis secondary to UTI [facility]) with concurrent pyelonephritis  Patient is presenting with multiple SIRS criteria including leukocytosis tachypnea tachycardia and fever in the setting of suspected urinary tract infection with pyelonephritis as well as concurrent organ dysfunction with acute kidney injury all suggestive of sepsis.   Continuing broad-spectrum intravenous antibiotics with intravenous Zosyn Extremely aggressive intravenous volume resuscitation Blood and urine cultures have been obtained Monitoring patient extremely closely in the stepdown unit  Active Problems:  Hypotension  Patient is exhibiting some degree of hypotension with systolic blood pressures in the 80s and 90s with maps in the upper 50s to low 60s despite aggressive intravenous volume resuscitation with nearly 4 L of isotonic fluids Monitoring patient closely, serial lactic acid levels are downtrending and patient is still awake alert and oriented x3 all reassuring I therefore do  not believe that vasopressors are necessary at this time If patient exhibits any change in mentation or worsening lactic acidosis or worsening hypotension will reconsider initiation of vasopressors.    COVID-19 virus infection  Question as to whether this diagnosis is incidental as patient exhibits no evidence of hypoxia with no evidence of pulmonary infiltrates on chest x-ray. Supportive care, no need for antiviral therapy or steroids Contact and airborne isolation As needed antitussives and bronchodilator therapy Zinc and vitamin C supplementation    Chest pain  Patient complaining of ongoing chest discomfort, worse with deep inspiration Obtaining EKG, monitoring patient on telemetry D-dimer elevated which may be secondary to combination of sepsis secondary to pyonephritis and COVID Due to ongoing chest discomfort will obtain VQ scan, unable to do CTA chest due to acute kidney injury.    Lactic acidosis  Patient presenting with lactic acidosis secondary to volume depletion and ongoing infection During patient aggressively with intravenous isotonic fluids, treating patient with broad-spectrum antibiotics performing serial lactic acid levels to ensure downtrending and resolution    AKI (acute kidney injury) (HCC)  Notable acute kidney injury with creatinine of 3.29 Multifactorial secondary to pyelonephritis/sepsis and prerenal injury due to poor oral intake for the past several days Strict input and output monitoring Hydrating patient with intravenous isotonic fluids Avoiding nephrotoxic agents if at all possible Monitoring renal function and electrolytes with serial chemistries    Hypokalemia  Replacing via both oral and IV potassium chloride  Hypomagnesemia  Replacing with intravenous magnesium sulfate   Code Status:  Full code  code status decision has been confirmed with: patient Family Communication: deferred   Status is: Inpatient  Remains inpatient appropriate  because:Ongoing diagnostic testing needed not appropriate for outpatient work up, IV treatments appropriate due to intensity of illness or inability to take PO, and Inpatient level of care appropriate due to severity of illness  Dispo: The patient is from: Home              Anticipated d/c is to: Home              Patient currently is not medically stable to d/c.   Difficult to place patient No        Marinda Elk MD [facility] Pager [phone]  If 7PM-7AM, please contact night-coverage [email or web address] Use universal [place] password for that web site. If you do not have the password, please call the hospital operator.  06/09/2021, 8:37 AM

## 2021-06-09 NOTE — Progress Notes (Signed)
Message left for nuclear medicine tech regarding stat order for [facility] Pulmonary Perf and Vent Scan.

## 2021-06-09 NOTE — Progress Notes (Signed)
PROGRESS NOTE    Danielle Carter  WUJ:[id number] DOB: 09/30/1996 DOA: 06/08/2021 PCP: Pcp, No   Brief Narrative:  25 year old g3p3 postpartum 2 months female with past medical history of depression, anxiety presenting to med [facility] emergency department with a several day history of nausea vomiting and abdominal pain. Notable flank/back pain with fever/N/V and known recent history of pyelonephritis/UTI x2 over the past year while pregnant. Imaging in ED consistent with recurrent pyelo - meeting sepsis criteria - admitted to ICU overnight for hypotension/sepsis.  Assessment & Plan:   Sepsis secondary to UTI [facility](HCC) with concurrent pyelonephritis, POA Hypotension (resolved) Lactic acidosis (resolving) Continue zosyn BP improving drastically with IVF and supportive care Lactic acidosis resolving Cultures pending  AKI (acute kidney injury) (HCC) Improving with IVF/supportive care Hold nephrotoxic/contrast as possible Creatinine downtrending appropriaetly  Incidental Covid19 positive, unlikely acute/symptomatic infection. Imaging unremarkable, labs not indicative of acute viral infection Isolation per hospital protocol, can de-escalate in 5 days per CDC guidelines  Atypical chest pain Pleuritic in nature VQ negative, D dimer minimally elevated  Electrolyte dysfunction  Secondary to above Monitor repeat K/Mg - replete as appropriate  DVT prophylaxis: Lovenox Code Status: Full Family Communication: Husband at bedside  Status is: Inpt  Dispo: The patient is from: Home              Anticipated d/c is to: Home              Anticipated d/c date is: 24-48h pending clinical course              Patient currently NOT medically stable for discharge  Consultants:  None  Procedures:  None  Antimicrobials:  Zosyn x 5 days   Subjective: No acute issues/events overnight; drastically improving per herself/husband at bedside  Objective: Vitals:   06/09/21 0530 06/09/21  0600 06/09/21 0700 06/09/21 0800  BP: (!) 87/45 (!) 92/45 (!) 87/46 (!) 98/53  Pulse: 98 97 100 (!) 107  Resp: (!) 22 (!) 21 20 (!) 25  Temp: 98.8 F (37.1 C)   (!) 100.8 F (38.2 C)  TempSrc: Axillary   Oral  SpO2: 100% 97% 99% 98%  Weight:      Height:        Intake/Output Summary (Last 24 hours) at 06/09/2021 1330 Last data filed at 06/09/2021 [id number] Gross per 24 hour  Intake 6211.13 ml  Output 500 ml  Net 5711.13 ml   Filed Weights   06/08/21 1445 06/08/21 2200  Weight: 59 kg 64 kg    Examination:  General exam: Appears calm and comfortable  Respiratory system: Clear to auscultation. Respiratory effort normal. Cardiovascular system: S1 & S2 heard, RRR. No JVD, murmurs, rubs, gallops or clicks. No pedal edema. Gastrointestinal system: Abdomen is nondistended, soft and nontender. No organomegaly or masses felt. Normal bowel sounds heard. Central nervous system: Alert and oriented. No focal neurological deficits. Extremities: Symmetric 5 x 5 power. Skin: No rashes, lesions or ulcers Psychiatry: Judgement and insight appear normal. Mood & affect appropriate.     Data Reviewed: I have personally reviewed following labs and imaging studies  CBC: Recent Labs  Lab 06/08/21 1558 06/09/21 0249  WBC 11.8* 6.0  NEUTROABS  --  4.7  HGB 12.8 9.0*  HCT 38.0 26.6*  MCV 76.8* 77.6*  PLT 155 85*   Basic Metabolic Panel: Recent Labs  Lab 06/08/21 1558 06/09/21 0249  NA 139 139  K 3.1* 2.8*  CL 101 107  CO2 19* 22  GLUCOSE 124* 108*  BUN 38* 36*  CREATININE 3.29* 2.48*  CALCIUM 8.4* 7.0*  MG  --  1.1*  PHOS  --  2.8   GFR: Estimated Creatinine Clearance: 30.5 mL/min (A) (by C-G formula based on SCr of 2.48 mg/dL (H)). Liver Function Tests: Recent Labs  Lab 06/08/21 1558 06/09/21 0249  AST 25  --   ALT 12  --   ALKPHOS 56  --   BILITOT 0.6  --   PROT 7.7  --   ALBUMIN 4.0 2.5*   Recent Labs  Lab 06/08/21 1558  LIPASE 44   No results for input(s):  AMMONIA in the last 168 hours. Coagulation Profile: Recent Labs  Lab 06/09/21 0249  INR 1.3*   Cardiac Enzymes: Recent Labs  Lab 06/09/21 0249  CKTOTAL 181   BNP (last 3 results) No results for input(s): PROBNP in the last 8760 hours. HbA1C: No results for input(s): HGBA1C in the last 72 hours. CBG: No results for input(s): GLUCAP in the last 168 hours. Lipid Profile: No results for input(s): CHOL, HDL, LDLCALC, TRIG, CHOLHDL, LDLDIRECT in the last 72 hours. Thyroid Function Tests: No results for input(s): TSH, T4TOTAL, FREET4, T3FREE, THYROIDAB in the last 72 hours. Anemia Panel: No results for input(s): VITAMINB12, FOLATE, FERRITIN, TIBC, IRON, RETICCTPCT in the last 72 hours. Sepsis Labs: Recent Labs  Lab 06/08/21 1639 06/08/21 1844 06/09/21 0459  LATICACIDVEN 1.2 2.8* 1.2    Recent Results (from the past 240 hour(s))  Blood culture (routine x 2)     Status: None (Preliminary result)   Collection Time: 06/08/21  2:54 PM   Specimen: BLOOD  Result Value Ref Range Status   Specimen Description BLOOD RIGHT ANTECUBITAL  Final   Special Requests   Final    BOTTLES DRAWN AEROBIC AND ANAEROBIC Blood Culture adequate volume   Culture   Final    NO GROWTH < 24 HOURS Performed at [facility], [address] [place], [place] [address]    Report Status PENDING  Incomplete  Urine Culture     Status: Abnormal (Preliminary result)   Collection Time: 06/08/21  2:54 PM   Specimen: Urine, Clean Catch  Result Value Ref Range Status   Specimen Description   Final    URINE, CLEAN CATCH Performed at [facility], [address], [place], [place] [address]    Special Requests   Final    NONE Performed at [facility], [address] [place], [place] [address]    Culture (A)  Final    >=100,000 COLONIES/mL ESCHERICHIA COLI SUSCEPTIBILITIES TO FOLLOW Performed at [facility], [address], [place], [place] [address]     Report Status PENDING  Incomplete  Resp Panel by RT-PCR (Flu A&B, Covid) Nasopharyngeal Swab     Status: Abnormal   Collection Time: 06/08/21  3:06 PM   Specimen: Nasopharyngeal Swab; Nasopharyngeal(NP) swabs in vial transport medium  Result Value Ref Range Status   SARS Coronavirus 2 by RT PCR POSITIVE (A) NEGATIVE Final    Comment: RESULT CALLED TO, READ BACK BY AND VERIFIED WITH: T CARBONE,RN 1614 06/08/21 DBRADLEY (NOTE) SARS-CoV-2 target nucleic acids are DETECTED.  The SARS-CoV-2 RNA is generally detectable in upper respiratory specimens during the acute phase of infection. Positive results are indicative of the presence of the identified virus, but do not rule out bacterial infection or co-infection with other pathogens not detected by the test. Clinical correlation with patient history and other diagnostic  information is necessary to determine patient infection status. The expected result is Negative.  Fact Sheet for Patients: BloggerCourse.com  Fact Sheet for Healthcare Providers: SeriousBroker.it  This test is not yet approved or cleared by the [place] FDA and  has been authorized for detection and/or diagnosis of SARS-CoV-2 by FDA under an Emergency Use Authorization (EUA).  This EUA will remain in effect (meaning this test can be  used) for the duration of  the COVID-19 declaration under Section 564(b)(1) of the Act, 21 U.S.C. section 360bbb-3(b)(1), unless the authorization is terminated or revoked sooner.     Influenza A by PCR NEGATIVE NEGATIVE Final   Influenza B by PCR NEGATIVE NEGATIVE Final    Comment: (NOTE) The Xpert Xpress SARS-CoV-2/FLU/RSV plus assay is intended as an aid in the diagnosis of influenza from Nasopharyngeal swab specimens and should not be used as a sole basis for treatment. Nasal washings and aspirates are unacceptable for Xpert Xpress SARS-CoV-2/FLU/RSV testing.  Fact Sheet for  Patients: BloggerCourse.com  Fact Sheet for Healthcare Providers: SeriousBroker.it  This test is not yet approved or cleared by the [place] FDA and has been authorized for detection and/or diagnosis of SARS-CoV-2 by FDA under an Emergency Use Authorization (EUA). This EUA will remain in effect (meaning this test can be used) for the duration of the COVID-19 declaration under Section 564(b)(1) of the Act, 21 U.S.C. section 360bbb-3(b)(1), unless the authorization is terminated or revoked.  Performed at [facility], [address], [place], [place] [address]   Blood culture (routine x 2)     Status: None (Preliminary result)   Collection Time: 06/08/21  4:39 PM   Specimen: BLOOD  Result Value Ref Range Status   Specimen Description BLOOD LEFT ANTECUBITAL  Final   Special Requests   Final    BOTTLES DRAWN AEROBIC AND ANAEROBIC Blood Culture adequate volume   Culture   Final    NO GROWTH < 24 HOURS Performed at [facility], [address], [place], [place] [address]    Report Status PENDING  Incomplete  MRSA Next Gen by PCR, Nasal     Status: None   Collection Time: 06/08/21 10:09 PM   Specimen: Nasal Mucosa; Nasal Swab  Result Value Ref Range Status   MRSA by PCR Next Gen NOT DETECTED NOT DETECTED Final    Comment: (NOTE) The GeneXpert MRSA Assay (FDA approved for NASAL specimens only), is one component of a comprehensive MRSA colonization surveillance program. It is not intended to diagnose MRSA infection nor to guide or monitor treatment for MRSA infections. Test performance is not FDA approved in patients less than 75 years old. Performed at [facility], [address], [place], [place] [address]          Radiology Studies: CT ABDOMEN PELVIS WO CONTRAST  Result Date: 06/08/2021 CLINICAL DATA:  Fever and abdominal pain. Vomiting. COVID positive today. EXAM: CT  ABDOMEN AND PELVIS WITHOUT CONTRAST TECHNIQUE: Multidetector CT imaging of the abdomen and pelvis was performed following the standard protocol without IV contrast. COMPARISON:  Contrast-enhanced CT 05/22/2020 FINDINGS: Lower chest: The lung bases are clear. Hepatobiliary: Minimal focal fatty infiltration adjacent to the falciform ligament. No evidence of focal liver lesion. Unremarkable gallbladder. No biliary dilatation. Pancreas: No ductal dilatation or inflammation. Spleen: Normal in size without focal abnormality. Adrenals/Urinary Tract: Moderate right perinephric edema with edematous appearance of the right kidney. No hydronephrosis. No renal or ureteral calculi. Unremarkable noncontrast appearance of the left  kidney. No left renal stones. Urinary bladder is minimally distended and not well assessed. No bladder stone. No adrenal nodule. Stomach/Bowel: Stomach is decompressed. There is no small bowel obstruction or inflammation. Normal appendix. Small to moderate colonic stool burden. No colonic Mewborn thickening or inflammation. Vascular/Lymphatic: Normal caliber abdominal aorta. No portal venous or mesenteric gas. There are prominent retroperitoneal lymph nodes are likely reactive. Reproductive: Unremarkable noncontrast CT appearance of the uterus. Tampon in the vagina. Quiescent ovaries. Other: No free air or free fluid. Musculoskeletal: There are no acute or suspicious osseous abnormalities. IMPRESSION: 1. Moderate right perinephric edema with edematous appearance of the right kidney, suspicious for pyelonephritis. No renal stones or obstructive uropathy. 2. Prominent retroperitoneal lymph nodes are likely reactive. Electronically Signed   By: Narda Rutherford M.D.   On: 06/08/2021 18:04   NM Pulmonary Perfusion  Result Date: 06/09/2021 CLINICAL DATA:  Chest pain and shortness of breath. EXAM: NUCLEAR MEDICINE PERFUSION LUNG SCAN TECHNIQUE: Perfusion images were obtained in multiple projections after  intravenous injection of radiopharmaceutical. Ventilation scans intentionally deferred if perfusion scan and chest x-ray adequate for interpretation during COVID 19 epidemic. RADIOPHARMACEUTICALS:  4.3 mCi Tc-61m MAA IV COMPARISON:  Chest x-ray 06/08/2021 FINDINGS: Normal pulmonary perfusion. No segmental or subsegmental defects to suggest pulmonary embolism. IMPRESSION: Negative pulmonary perfusion study for pulmonary embolism. Electronically Signed   By: Rudie Meyer M.D.   On: 06/09/2021 11:16   DG Chest Port 1 View  Result Date: 06/08/2021 CLINICAL DATA:  Sepsis.  Fever.  Abdominal pain and vomiting. EXAM: PORTABLE CHEST 1 VIEW COMPARISON:  None. FINDINGS: The cardiomediastinal contours are normal. The lungs are clear. Pulmonary vasculature is normal. No consolidation, pleural effusion, or pneumothorax. No acute osseous abnormalities are seen. IMPRESSION: Negative AP view of the chest. Electronically Signed   By: Narda Rutherford M.D.   On: 06/08/2021 18:06     Scheduled Meds:  Chlorhexidine Gluconate Cloth  6 each Topical Daily   enoxaparin (LOVENOX) injection  30 mg Subcutaneous Q24H   Continuous Infusions:  lactated ringers with KCl 20 mEq/L 125 mL/hr at 06/09/21 0605   piperacillin-tazobactam (ZOSYN)  IV 3.375 g (06/09/21 1101)     LOS: 1 day   Time spent:  Azucena Fallen, DO [facility]  If 7PM-7AM, please contact night-coverage [email or web address]  06/09/2021, 1:30 PM

## 2021-06-09 NOTE — Progress Notes (Signed)
Called pt for virtual visit, husband answered and stated that pt is admitted to ICU at [facility].

## 2021-06-10 DIAGNOSIS — N390 Urinary tract infection, site not specified: Secondary | ICD-10-CM | POA: Diagnosis not present

## 2021-06-10 DIAGNOSIS — A419 Sepsis, unspecified organism: Secondary | ICD-10-CM | POA: Diagnosis not present

## 2021-06-10 LAB — BASIC METABOLIC PANEL
Anion gap: 8 (ref 5–15)
BUN: 27 mg/dL — ABNORMAL HIGH (ref 6–20)
CO2: 21 mmol/L — ABNORMAL LOW (ref 22–32)
Calcium: 8.2 mg/dL — ABNORMAL LOW (ref 8.9–10.3)
Chloride: 112 mmol/L — ABNORMAL HIGH (ref 98–111)
Creatinine, Ser: 2.11 mg/dL — ABNORMAL HIGH (ref 0.44–1.00)
GFR, Estimated: 33 mL/min — ABNORMAL LOW (ref >60)
Glucose, Bld: 106 mg/dL — ABNORMAL HIGH (ref 70–99)
Potassium: 3.9 mmol/L (ref 3.5–5.1)
Sodium: 141 mmol/L (ref 135–145)

## 2021-06-10 LAB — CBC
HCT: 30.2 % — ABNORMAL LOW (ref 36.0–46.0)
Hemoglobin: 10 g/dL — ABNORMAL LOW (ref 12.0–15.0)
MCH: 26 pg (ref 26.0–34.0)
MCHC: 33.1 g/dL (ref 30.0–36.0)
MCV: 78.6 fL — ABNORMAL LOW (ref 80.0–100.0)
Platelets: 142 10*3/uL — ABNORMAL LOW (ref 150–400)
RBC: 3.84 MIL/uL — ABNORMAL LOW (ref 3.87–5.11)
RDW: 16 % — ABNORMAL HIGH (ref 11.5–15.5)
WBC: 9 10*3/uL (ref 4.0–10.5)
nRBC: 0 % (ref 0.0–0.2)

## 2021-06-10 LAB — URINE CULTURE: Culture: >=100000 — AB

## 2021-06-10 MED ORDER — ENOXAPARIN SODIUM 40 MG/0.4ML IJ SOSY
40.0000 mg | PREFILLED_SYRINGE | INTRAMUSCULAR | Status: DC
  Administered 2021-06-11: 40 mg via SUBCUTANEOUS
  Filled 2021-06-10: qty 0.4

## 2021-06-10 MED ORDER — SODIUM CHLORIDE 0.9 % IV SOLN
1.5000 g | Freq: Four times a day (QID) | INTRAVENOUS | Status: DC
  Administered 2021-06-10 – 2021-06-11 (×4): 1.5 g via INTRAVENOUS
  Filled 2021-06-10: qty 1.5
  Filled 2021-06-10: qty 4
  Filled 2021-06-10: qty 1.5
  Filled 2021-06-10 (×2): qty 4

## 2021-06-10 MED ORDER — SODIUM CHLORIDE 0.9 % IV SOLN: Freq: Once | INTRAVENOUS | Status: AC

## 2021-06-10 NOTE — Progress Notes (Signed)
PROGRESS NOTE    Danielle Carter  POE:[id number] DOB: 23-Dec-1995 DOA: 06/08/2021 PCP: Pcp, No   Brief Narrative:  25 year old g3p3 postpartum 2 months female with past medical history of depression, anxiety presenting to med [facility] emergency department with a several day history of nausea vomiting and abdominal pain. Notable flank/back pain with fever/N/V and known recent history of pyelonephritis/UTI x2 over the past year while pregnant. Imaging in ED consistent with recurrent pyelo - meeting sepsis criteria - admitted to ICU overnight for hypotension/sepsis.  Assessment & Plan:   Sepsis secondary to UTI [facility]) with concurrent pyelonephritis, POA Hypotension (resolved) Lactic acidosis (resolving) Discontinue zosyn based on preliminary urine culture for E. Coli Start Unasyn, reported ceftriaxone allergy Blood pressure remains soft, remains febrile overnight will require another 24 to 48 hours of IV antibiotics until symptoms improve, then ultimately can transition to p.o. antibiotics  AKI (acute kidney injury) (HCC), resolving Continue to increase p.o. intake as tolerated, maintenance fluids ongoing Hold nephrotoxic/contrast as possible Creatinine downtrending appropriately  Incidental Covid19 positive, unlikely acute/symptomatic infection. Imaging unremarkable, labs not indicative of acute viral infection Isolation per hospital protocol, can de-escalate in 5 days per CDC guidelines  Atypical chest pain Pleuritic in nature VQ negative, D dimer minimally elevated  Electrolyte dysfunction  Secondary to above Monitor repeat K/Mg - replete as appropriate  DVT prophylaxis: Lovenox Code Status: Full Family Communication: Husband at bedside  Status is: Inpt  Dispo: The patient is from: Home              Anticipated d/c is to: Home              Anticipated d/c date is: 24-48h pending clinical course              Patient currently NOT medically stable for  discharge  Consultants:  None  Procedures:  None  Antimicrobials:  Zosyn --> Unasyn  Subjective: Remains febrile and somewhat hypotensive overnight, denies chest pain shortness of breath nausea vomiting diarrhea constipation headache  Objective: Vitals:   06/10/21 0100 06/10/21 0200 06/10/21 0300 06/10/21 0400  BP: (!) 96/54 (!) 92/45 (!) 97/52 103/60  Pulse: 92 96 94 89  Resp: (!) 24 19 (!) 21 (!) 23  Temp:    (!) 102.2 F (39 C)  TempSrc:    Axillary  SpO2: 97% 96% 93% 95%  Weight:      Height:        Intake/Output Summary (Last 24 hours) at 06/10/2021 0719 Last data filed at 06/09/2021 1800 Gross per 24 hour  Intake 404.55 ml  Output --  Net 404.55 ml    Filed Weights   06/08/21 1445 06/08/21 2200  Weight: 59 kg 64 kg    Examination:  General exam: Appears calm and comfortable  Respiratory system: Clear to auscultation. Respiratory effort normal. Cardiovascular system: S1 & S2 heard, RRR. No JVD, murmurs, rubs, gallops or clicks. No pedal edema. Gastrointestinal system: Abdomen is nondistended, soft and nontender. No organomegaly or masses felt. Normal bowel sounds heard. Central nervous system: Alert and oriented. No focal neurological deficits. Extremities: Symmetric 5 x 5 power. Skin: No rashes, lesions or ulcers Psychiatry: Judgement and insight appear normal. Mood & affect appropriate.     Data Reviewed: I have personally reviewed following labs and imaging studies  CBC: Recent Labs  Lab 06/08/21 1558 06/09/21 0249 06/10/21 0613  WBC 11.8* 6.0 9.0  NEUTROABS  --  4.7  --   HGB 12.8 9.0* 10.0*  HCT 38.0 26.6* 30.2*  MCV 76.8* 77.6* 78.6*  PLT 155 85* 142*    Basic Metabolic Panel: Recent Labs  Lab 06/08/21 1558 06/09/21 0249 06/10/21 0613  NA 139 139 141  K 3.1* 2.8* 3.9  CL 101 107 112*  CO2 19* 22 21*  GLUCOSE 124* 108* 106*  BUN 38* 36* 27*  CREATININE 3.29* 2.48* 2.11*  CALCIUM 8.4* 7.0* 8.2*  MG  --  1.1*  --   PHOS  --  2.8   --     GFR: Estimated Creatinine Clearance: 35.8 mL/min (A) (by C-G formula based on SCr of 2.11 mg/dL (H)). Liver Function Tests: Recent Labs  Lab 06/08/21 1558 06/09/21 0249  AST 25  --   ALT 12  --   ALKPHOS 56  --   BILITOT 0.6  --   PROT 7.7  --   ALBUMIN 4.0 2.5*    Recent Labs  Lab 06/08/21 1558  LIPASE 44    No results for input(s): AMMONIA in the last 168 hours. Coagulation Profile: Recent Labs  Lab 06/09/21 0249  INR 1.3*    Cardiac Enzymes: Recent Labs  Lab 06/09/21 0249  CKTOTAL 181    BNP (last 3 results) No results for input(s): PROBNP in the last 8760 hours. HbA1C: No results for input(s): HGBA1C in the last 72 hours. CBG: No results for input(s): GLUCAP in the last 168 hours. Lipid Profile: No results for input(s): CHOL, HDL, LDLCALC, TRIG, CHOLHDL, LDLDIRECT in the last 72 hours. Thyroid Function Tests: No results for input(s): TSH, T4TOTAL, FREET4, T3FREE, THYROIDAB in the last 72 hours. Anemia Panel: No results for input(s): VITAMINB12, FOLATE, FERRITIN, TIBC, IRON, RETICCTPCT in the last 72 hours. Sepsis Labs: Recent Labs  Lab 06/08/21 1639 06/08/21 1844 06/09/21 0459  LATICACIDVEN 1.2 2.8* 1.2     Recent Results (from the past 240 hour(s))  Blood culture (routine x 2)     Status: None (Preliminary result)   Collection Time: 06/08/21  2:54 PM   Specimen: BLOOD  Result Value Ref Range Status   Specimen Description BLOOD RIGHT ANTECUBITAL  Final   Special Requests   Final    BOTTLES DRAWN AEROBIC AND ANAEROBIC Blood Culture adequate volume   Culture   Final    NO GROWTH < 24 HOURS Performed at [facility], [address] [place], [place] [address]    Report Status PENDING  Incomplete  Urine Culture     Status: Abnormal (Preliminary result)   Collection Time: 06/08/21  2:54 PM   Specimen: Urine, Clean Catch  Result Value Ref Range Status   Specimen Description   Final    URINE, CLEAN CATCH Performed at [facility], [address], [place], [place] [address]    Special Requests   Final    NONE Performed at [facility], [address], [place], [place] [address]    Culture (A)  Final    >=100,000 COLONIES/mL ESCHERICHIA COLI SUSCEPTIBILITIES TO FOLLOW Performed at [facility], [address], [place], [place] [address]    Report Status PENDING  Incomplete  Resp Panel by RT-PCR (Flu A&B, Covid) Nasopharyngeal Swab     Status: Abnormal   Collection Time: 06/08/21  3:06 PM   Specimen: Nasopharyngeal Swab; Nasopharyngeal(NP) swabs in vial transport medium  Result Value Ref Range Status   SARS Coronavirus 2 by RT PCR POSITIVE (A) NEGATIVE Final    Comment: RESULT CALLED TO, READ BACK BY AND  VERIFIED WITH: T CARBONE,RN 1614 06/08/21 DBRADLEY (NOTE) SARS-CoV-2 target nucleic acids are DETECTED.  The SARS-CoV-2 RNA is generally detectable in upper respiratory specimens during the acute phase of infection. Positive results are indicative of the presence of the identified virus, but do not rule out bacterial infection or co-infection with other pathogens not detected by the test. Clinical correlation with patient history and other diagnostic information is necessary to determine patient infection status. The expected result is Negative.  Fact Sheet for Patients: BloggerCourse.comhttps://www.fda.gov/media/152166/download  Fact Sheet for Healthcare Providers: SeriousBroker.ithttps://www.fda.gov/media/152162/download  This test is not yet approved or cleared by the [place] FDA and  has been authorized for detection and/or diagnosis of SARS-CoV-2 by FDA under an Emergency Use Authorization (EUA).  This EUA will remain in effect (meaning this test can be  used) for the duration of  the COVID-19 declaration under Section 564(b)(1) of the Act, 21 U.S.C. section 360bbb-3(b)(1), unless the authorization is terminated or revoked sooner.     Influenza A by PCR NEGATIVE NEGATIVE  Final   Influenza B by PCR NEGATIVE NEGATIVE Final    Comment: (NOTE) The Xpert Xpress SARS-CoV-2/FLU/RSV plus assay is intended as an aid in the diagnosis of influenza from Nasopharyngeal swab specimens and should not be used as a sole basis for treatment. Nasal washings and aspirates are unacceptable for Xpert Xpress SARS-CoV-2/FLU/RSV testing.  Fact Sheet for Patients: BloggerCourse.comhttps://www.fda.gov/media/152166/download  Fact Sheet for Healthcare Providers: SeriousBroker.ithttps://www.fda.gov/media/152162/download  This test is not yet approved or cleared by the [place] FDA and has been authorized for detection and/or diagnosis of SARS-CoV-2 by FDA under an Emergency Use Authorization (EUA). This EUA will remain in effect (meaning this test can be used) for the duration of the COVID-19 declaration under Section 564(b)(1) of the Act, 21 U.S.C. section 360bbb-3(b)(1), unless the authorization is terminated or revoked.  Performed at [facility], [address], [place], [place] [address]   Blood culture (routine x 2)     Status: None (Preliminary result)   Collection Time: 06/08/21  4:39 PM   Specimen: BLOOD  Result Value Ref Range Status   Specimen Description BLOOD LEFT ANTECUBITAL  Final   Special Requests   Final    BOTTLES DRAWN AEROBIC AND ANAEROBIC Blood Culture adequate volume   Culture   Final    NO GROWTH < 24 HOURS Performed at [facility], [address] [place], [place] [address]    Report Status PENDING  Incomplete  MRSA Next Gen by PCR, Nasal     Status: None   Collection Time: 06/08/21 10:09 PM   Specimen: Nasal Mucosa; Nasal Swab  Result Value Ref Range Status   MRSA by PCR Next Gen NOT DETECTED NOT DETECTED Final    Comment: (NOTE) The GeneXpert MRSA Assay (FDA approved for NASAL specimens only), is one component of a comprehensive MRSA colonization surveillance program. It is not intended to diagnose MRSA infection nor to guide or monitor  treatment for MRSA infections. Test performance is not FDA approved in patients less than 23 years old. Performed at [facility], [address] [place], [place] [address]           Radiology Studies: CT ABDOMEN PELVIS WO CONTRAST  Result Date: 06/08/2021 CLINICAL DATA:  Fever and abdominal pain. Vomiting. COVID positive today. EXAM: CT ABDOMEN AND PELVIS WITHOUT CONTRAST TECHNIQUE: Multidetector CT imaging of the abdomen and pelvis was performed following the standard protocol without IV contrast. COMPARISON:  Contrast-enhanced CT 05/22/2020  FINDINGS: Lower chest: The lung bases are clear. Hepatobiliary: Minimal focal fatty infiltration adjacent to the falciform ligament. No evidence of focal liver lesion. Unremarkable gallbladder. No biliary dilatation. Pancreas: No ductal dilatation or inflammation. Spleen: Normal in size without focal abnormality. Adrenals/Urinary Tract: Moderate right perinephric edema with edematous appearance of the right kidney. No hydronephrosis. No renal or ureteral calculi. Unremarkable noncontrast appearance of the left kidney. No left renal stones. Urinary bladder is minimally distended and not well assessed. No bladder stone. No adrenal nodule. Stomach/Bowel: Stomach is decompressed. There is no small bowel obstruction or inflammation. Normal appendix. Small to moderate colonic stool burden. No colonic Ivens thickening or inflammation. Vascular/Lymphatic: Normal caliber abdominal aorta. No portal venous or mesenteric gas. There are prominent retroperitoneal lymph nodes are likely reactive. Reproductive: Unremarkable noncontrast CT appearance of the uterus. Tampon in the vagina. Quiescent ovaries. Other: No free air or free fluid. Musculoskeletal: There are no acute or suspicious osseous abnormalities. IMPRESSION: 1. Moderate right perinephric edema with edematous appearance of the right kidney, suspicious for pyelonephritis. No renal stones or  obstructive uropathy. 2. Prominent retroperitoneal lymph nodes are likely reactive. Electronically Signed   By: Narda Rutherford M.D.   On: 06/08/2021 18:04   NM Pulmonary Perfusion  Result Date: 06/09/2021 CLINICAL DATA:  Chest pain and shortness of breath. EXAM: NUCLEAR MEDICINE PERFUSION LUNG SCAN TECHNIQUE: Perfusion images were obtained in multiple projections after intravenous injection of radiopharmaceutical. Ventilation scans intentionally deferred if perfusion scan and chest x-ray adequate for interpretation during COVID 19 epidemic. RADIOPHARMACEUTICALS:  4.3 mCi Tc-65m MAA IV COMPARISON:  Chest x-ray 06/08/2021 FINDINGS: Normal pulmonary perfusion. No segmental or subsegmental defects to suggest pulmonary embolism. IMPRESSION: Negative pulmonary perfusion study for pulmonary embolism. Electronically Signed   By: Rudie Meyer M.D.   On: 06/09/2021 11:16   DG Chest Port 1 View  Result Date: 06/08/2021 CLINICAL DATA:  Sepsis.  Fever.  Abdominal pain and vomiting. EXAM: PORTABLE CHEST 1 VIEW COMPARISON:  None. FINDINGS: The cardiomediastinal contours are normal. The lungs are clear. Pulmonary vasculature is normal. No consolidation, pleural effusion, or pneumothorax. No acute osseous abnormalities are seen. IMPRESSION: Negative AP view of the chest. Electronically Signed   By: Narda Rutherford M.D.   On: 06/08/2021 18:06     Scheduled Meds:  Chlorhexidine Gluconate Cloth  6 each Topical Daily   enoxaparin (LOVENOX) injection  30 mg Subcutaneous Q24H   Continuous Infusions:  piperacillin-tazobactam (ZOSYN)  IV Stopped (06/10/21 4619)     LOS: 2 days   Time spent:  Azucena Fallen, DO [facility]  If 7PM-7AM, please contact night-coverage [email or web address]  06/10/2021, 7:19 AM

## 2021-06-11 ENCOUNTER — Other Ambulatory Visit (HOSPITAL_COMMUNITY): Payer: Self-pay

## 2021-06-11 DIAGNOSIS — A419 Sepsis, unspecified organism: Secondary | ICD-10-CM | POA: Diagnosis not present

## 2021-06-11 DIAGNOSIS — N390 Urinary tract infection, site not specified: Secondary | ICD-10-CM | POA: Diagnosis not present

## 2021-06-11 MED ORDER — HYDROCODONE-ACETAMINOPHEN 5-325 MG PO TABS: 1.0000 | ORAL_TABLET | ORAL | 0 refills | Status: DC | PRN

## 2021-06-11 MED ORDER — AMOXICILLIN-POT CLAVULANATE 875-125 MG PO TABS
1.0000 | ORAL_TABLET | Freq: Two times a day (BID) | ORAL | 0 refills | Status: AC
  Filled 2021-06-11: qty 20, 10d supply, fill #0

## 2021-06-11 NOTE — Progress Notes (Signed)
AVS instructions reviewed with pt per orders; all questions answered at this time by RN. Education and care plan completed. All pt belongings returned to pt. PIVs removed. Pt was transported to main lobby in the wheelchair by this RN.

## 2021-06-11 NOTE — Discharge Summary (Signed)
Physician Discharge Summary  Danielle Carter FAO:[id number] DOB: 08-17-1996 DOA: 06/08/2021  PCP: Pcp, No  Admit date: 06/08/2021 Discharge date: 06/11/2021  Admitted From: Home Disposition: Home  Recommendations for Outpatient Follow-up:  Follow up with PCP in 1-2 weeks Please obtain BMP/CBC in one week Please follow up with gynecology as scheduled  Home Health: None Equipment/Devices: None  Discharge Condition: Stable CODE STATUS: Full Diet recommendation: Regular diet as tolerated  Brief/Interim Summary:  Sepsis secondary to Ecoli UTI [facility]) with concurrent pyelonephritis, POA Hypotension (resolved) Lactic acidosis (resolving) Transition to Augmentin given tolerance of Unasyn despite cephalosporin allergy Complete p.o. antibiotics, follow-up with PCP and gynecology for repeat testing, return to the ED or office if worsening urinary symptoms frequency urgency or pain   AKI (acute kidney injury) (HCC), resolving Resolving with p.o. intake, repeat labs as above with PCP next week   Incidental Covid19 positive, unlikely acute/symptomatic infection. Imaging unremarkable, labs not indicative of acute viral infection Isolation per hospital protocol, can de-escalate in 5 days per CDC guidelines   Atypical chest pain Pleuritic in nature VQ negative, D dimer minimally elevated likely secondary to above   Electrolyte dysfunction  Secondary to above Monitor repeat K/Mg - replete as appropriate   Discharge Instructions  Discharge Instructions     Call MD for:  temperature >100.4   Complete by: As directed    Diet - low sodium heart healthy   Complete by: As directed    Increase activity slowly   Complete by: As directed       Allergies as of 06/11/2021       Reactions   Abilify [aripiprazole] Other (See Comments)   Massive mood swings   Nickel Other (See Comments)   Turns green   Pristiq [desvenlafaxine] Other (See Comments)   Massive headaches   Rocephin [ceftriaxone  Sodium In Dextrose] Hives   Latex Rash        Medication List     TAKE these medications    acetaminophen 500 MG tablet Commonly known as: TYLENOL Take 1,000 mg by mouth every 6 (six) hours as needed for mild pain or headache.   amoxicillin-clavulanate 875-125 MG tablet Commonly known as: Augmentin Take 1 tablet by mouth 2 (two) times daily for 10 days.   coconut oil Oil Apply 1 application topically as needed (nipple pain).   cyclobenzaprine 5 MG tablet Commonly known as: FLEXERIL Take 5 mg by mouth 3 (three) times daily as needed.   HYDROcodone-acetaminophen 5-325 MG tablet Commonly known as: NORCO/VICODIN Take 1 tablet by mouth every 4 (four) hours as needed for moderate pain.   ibuprofen 600 MG tablet Commonly known as: ADVIL Take 1 tablet (600 mg total) by mouth every 6 (six) hours.        Allergies  Allergen Reactions   Abilify [Aripiprazole] Other (See Comments)    Massive mood swings   Nickel Other (See Comments)    Turns green   Pristiq [Desvenlafaxine] Other (See Comments)    Massive headaches   Rocephin [Ceftriaxone Sodium In Dextrose] Hives   Latex Rash    Consultations: None  Procedures/Studies: CT ABDOMEN PELVIS WO CONTRAST  Result Date: 06/08/2021 CLINICAL DATA:  Fever and abdominal pain. Vomiting. COVID positive today. EXAM: CT ABDOMEN AND PELVIS WITHOUT CONTRAST TECHNIQUE: Multidetector CT imaging of the abdomen and pelvis was performed following the standard protocol without IV contrast. COMPARISON:  Contrast-enhanced CT 05/22/2020 FINDINGS: Lower chest: The lung bases are clear. Hepatobiliary: Minimal focal fatty infiltration adjacent to the  falciform ligament. No evidence of focal liver lesion. Unremarkable gallbladder. No biliary dilatation. Pancreas: No ductal dilatation or inflammation. Spleen: Normal in size without focal abnormality. Adrenals/Urinary Tract: Moderate right perinephric edema with edematous appearance of the right kidney.  No hydronephrosis. No renal or ureteral calculi. Unremarkable noncontrast appearance of the left kidney. No left renal stones. Urinary bladder is minimally distended and not well assessed. No bladder stone. No adrenal nodule. Stomach/Bowel: Stomach is decompressed. There is no small bowel obstruction or inflammation. Normal appendix. Small to moderate colonic stool burden. No colonic [place] thickening or inflammation. Vascular/Lymphatic: Normal caliber abdominal aorta. No portal venous or mesenteric gas. There are prominent retroperitoneal lymph nodes are likely reactive. Reproductive: Unremarkable noncontrast CT appearance of the uterus. Tampon in the vagina. Quiescent ovaries. Other: No free air or free fluid. Musculoskeletal: There are no acute or suspicious osseous abnormalities. IMPRESSION: 1. Moderate right perinephric edema with edematous appearance of the right kidney, suspicious for pyelonephritis. No renal stones or obstructive uropathy. 2. Prominent retroperitoneal lymph nodes are likely reactive. Electronically Signed   By: Narda Rutherford M.D.   On: 06/08/2021 18:04   NM Pulmonary Perfusion  Result Date: 06/09/2021 CLINICAL DATA:  Chest pain and shortness of breath. EXAM: NUCLEAR MEDICINE PERFUSION LUNG SCAN TECHNIQUE: Perfusion images were obtained in multiple projections after intravenous injection of radiopharmaceutical. Ventilation scans intentionally deferred if perfusion scan and chest x-ray adequate for interpretation during COVID 19 epidemic. RADIOPHARMACEUTICALS:  4.3 mCi Tc-50m MAA IV COMPARISON:  Chest x-ray 06/08/2021 FINDINGS: Normal pulmonary perfusion. No segmental or subsegmental defects to suggest pulmonary embolism. IMPRESSION: Negative pulmonary perfusion study for pulmonary embolism. Electronically Signed   By: Rudie Meyer M.D.   On: 06/09/2021 11:16   DG Chest Port 1 View  Result Date: 06/08/2021 CLINICAL DATA:  Sepsis.  Fever.  Abdominal pain and vomiting. EXAM: PORTABLE  CHEST 1 VIEW COMPARISON:  None. FINDINGS: The cardiomediastinal contours are normal. The lungs are clear. Pulmonary vasculature is normal. No consolidation, pleural effusion, or pneumothorax. No acute osseous abnormalities are seen. IMPRESSION: Negative AP view of the chest. Electronically Signed   By: Narda Rutherford M.D.   On: 06/08/2021 18:06     Subjective: No acute issues or events overnight denies nausea vomiting diarrhea constipation headache fevers chills or chest pain   Discharge Exam: Vitals:   06/11/21 0826 06/11/21 0900  BP: 109/83 121/84  Pulse: (!) 106 87  Resp: 16 (!) 25  Temp: 99.4 F (37.4 C)   SpO2: 99% 95%   Vitals:   06/11/21 0433 06/11/21 0700 06/11/21 0826 06/11/21 0900  BP:   109/83 121/84  Pulse:   (!) 106 87  Resp:   16 (!) 25  Temp: 98.5 F (36.9 C) 99.3 F (37.4 C) 99.4 F (37.4 C)   TempSrc: Oral Oral Oral   SpO2:   99% 95%  Weight:      Height:        General: Pt is alert, awake, not in acute distress Cardiovascular: RRR, S1/S2 +, no rubs, no gallops Respiratory: CTA bilaterally, no wheezing, no rhonchi Abdominal: Soft, NT, ND, bowel sounds + Extremities: no edema, no cyanosis    The results of significant diagnostics from this hospitalization (including imaging, microbiology, ancillary and laboratory) are listed below for reference.     Microbiology: Recent Results (from the past 240 hour(s))  Blood culture (routine x 2)     Status: None (Preliminary result)   Collection Time: 06/08/21  2:54 PM   Specimen:  BLOOD  Result Value Ref Range Status   Specimen Description BLOOD RIGHT ANTECUBITAL  Final   Special Requests   Final    BOTTLES DRAWN AEROBIC AND ANAEROBIC Blood Culture adequate volume   Culture   Final    NO GROWTH 2 DAYS Performed at [facility], [address] [place], [place] [address]    Report Status PENDING  Incomplete  Urine Culture     Status: Abnormal   Collection Time: 06/08/21  2:54 PM   Specimen: Urine,  Clean Catch  Result Value Ref Range Status   Specimen Description   Final    URINE, CLEAN CATCH Performed at [facility], [address] [place], [place] [address]    Special Requests   Final    NONE Performed at [facility], [address], [place], [place] [address]    Culture >=100,000 COLONIES/mL ESCHERICHIA COLI (A)  Final   Report Status 06/10/2021 FINAL  Final   Organism ID, Bacteria ESCHERICHIA COLI (A)  Final      Susceptibility   Escherichia coli - MIC*    AMPICILLIN 4 SENSITIVE Sensitive     CEFAZOLIN <=4 SENSITIVE Sensitive     CEFEPIME <=0.12 SENSITIVE Sensitive     CEFTRIAXONE <=0.25 SENSITIVE Sensitive     CIPROFLOXACIN <=0.25 SENSITIVE Sensitive     GENTAMICIN <=1 SENSITIVE Sensitive     IMIPENEM <=0.25 SENSITIVE Sensitive     NITROFURANTOIN <=16 SENSITIVE Sensitive     TRIMETH/SULFA <=20 SENSITIVE Sensitive     AMPICILLIN/SULBACTAM <=2 SENSITIVE Sensitive     PIP/TAZO <=4 SENSITIVE Sensitive     * >=100,000 COLONIES/mL ESCHERICHIA COLI  Resp Panel by RT-PCR (Flu A&B, Covid) Nasopharyngeal Swab     Status: Abnormal   Collection Time: 06/08/21  3:06 PM   Specimen: Nasopharyngeal Swab; Nasopharyngeal(NP) swabs in vial transport medium  Result Value Ref Range Status   SARS Coronavirus 2 by RT PCR POSITIVE (A) NEGATIVE Final    Comment: RESULT CALLED TO, READ BACK BY AND VERIFIED WITH: T CARBONE,RN 1614 06/08/21 DBRADLEY (NOTE) SARS-CoV-2 target nucleic acids are DETECTED.  The SARS-CoV-2 RNA is generally detectable in upper respiratory specimens during the acute phase of infection. Positive results are indicative of the presence of the identified virus, but do not rule out bacterial infection or co-infection with other pathogens not detected by the test. Clinical correlation with patient history and other diagnostic information is necessary to determine patient infection status. The expected result is Negative.  Fact  Sheet for Patients: BloggerCourse.com  Fact Sheet for Healthcare Providers: SeriousBroker.it  This test is not yet approved or cleared by the [place] FDA and  has been authorized for detection and/or diagnosis of SARS-CoV-2 by FDA under an Emergency Use Authorization (EUA).  This EUA will remain in effect (meaning this test can be  used) for the duration of  the COVID-19 declaration under Section 564(b)(1) of the Act, 21 U.S.C. section 360bbb-3(b)(1), unless the authorization is terminated or revoked sooner.     Influenza A by PCR NEGATIVE NEGATIVE Final   Influenza B by PCR NEGATIVE NEGATIVE Final    Comment: (NOTE) The Xpert Xpress SARS-CoV-2/FLU/RSV plus assay is intended as an aid in the diagnosis of influenza from Nasopharyngeal swab specimens and should not be used as a sole basis for treatment. Nasal washings and aspirates are unacceptable for Xpert Xpress SARS-CoV-2/FLU/RSV testing.  Fact Sheet for Patients: BloggerCourse.com  Fact Sheet for Healthcare Providers: SeriousBroker.it  This test is  not yet approved or cleared by the [place] and has been authorized for detection and/or diagnosis of SARS-CoV-2 by FDA under an Emergency Use Authorization (EUA). This EUA will remain in effect (meaning this test can be used) for the duration of the COVID-19 declaration under Section 564(b)(1) of the Act, 21 U.S.C. section 360bbb-3(b)(1), unless the authorization is terminated or revoked.  Performed at [facility], [address], [place], [place] [address]   Blood culture (routine x 2)     Status: None (Preliminary result)   Collection Time: 06/08/21  4:39 PM   Specimen: BLOOD  Result Value Ref Range Status   Specimen Description BLOOD LEFT ANTECUBITAL  Final   Special Requests   Final    BOTTLES DRAWN AEROBIC AND ANAEROBIC Blood Culture  adequate volume   Culture   Final    NO GROWTH 2 DAYS Performed at [facility], [address] [place], [place] [address]    Report Status PENDING  Incomplete  MRSA Next Gen by PCR, Nasal     Status: None   Collection Time: 06/08/21 10:09 PM   Specimen: Nasal Mucosa; Nasal Swab  Result Value Ref Range Status   MRSA by PCR Next Gen NOT DETECTED NOT DETECTED Final    Comment: (NOTE) The GeneXpert MRSA Assay (FDA approved for NASAL specimens only), is one component of a comprehensive MRSA colonization surveillance program. It is not intended to diagnose MRSA infection nor to guide or monitor treatment for MRSA infections. Test performance is not FDA approved in patients less than 74 years old. Performed at [facility], [address] [place], [place] [address]      Labs: BNP (last 3 results) No results for input(s): BNP in the last 8760 hours. Basic Metabolic Panel: Recent Labs  Lab 06/08/21 1558 06/09/21 0249 06/10/21 0613  NA 139 139 141  K 3.1* 2.8* 3.9  CL 101 107 112*  CO2 19* 22 21*  GLUCOSE 124* 108* 106*  BUN 38* 36* 27*  CREATININE 3.29* 2.48* 2.11*  CALCIUM 8.4* 7.0* 8.2*  MG  --  1.1*  --   PHOS  --  2.8  --    Liver Function Tests: Recent Labs  Lab 06/08/21 1558 06/09/21 0249  AST 25  --   ALT 12  --   ALKPHOS 56  --   BILITOT 0.6  --   PROT 7.7  --   ALBUMIN 4.0 2.5*   Recent Labs  Lab 06/08/21 1558  LIPASE 44   No results for input(s): AMMONIA in the last 168 hours. CBC: Recent Labs  Lab 06/08/21 1558 06/09/21 0249 06/10/21 0613  WBC 11.8* 6.0 9.0  NEUTROABS  --  4.7  --   HGB 12.8 9.0* 10.0*  HCT 38.0 26.6* 30.2*  MCV 76.8* 77.6* 78.6*  PLT 155 85* 142*   Cardiac Enzymes: Recent Labs  Lab 06/09/21 0249  CKTOTAL 181   BNP: Invalid input(s): POCBNP CBG: No results for input(s): GLUCAP in the last 168 hours. D-Dimer Recent Labs    06/09/21 0249  DDIMER 3.10*   Hgb A1c No results for  input(s): HGBA1C in the last 72 hours. Lipid Profile No results for input(s): CHOL, HDL, LDLCALC, TRIG, CHOLHDL, LDLDIRECT in the last 72 hours. Thyroid function studies No results for input(s): TSH, T4TOTAL, T3FREE, THYROIDAB in the last 72 hours.  Invalid input(s): FREET3 Anemia work up No results for input(s): VITAMINB12, FOLATE, FERRITIN, TIBC, IRON, RETICCTPCT in the  last 72 hours. Urinalysis    Component Value Date/Time   COLORURINE YELLOW 06/08/2021 1454   APPEARANCEUR HAZY (A) 06/08/2021 1454   LABSPEC 1.017 06/08/2021 1454   PHURINE 6.0 06/08/2021 1454   GLUCOSEU NEGATIVE 06/08/2021 1454   HGBUR MODERATE (A) 06/08/2021 1454   BILIRUBINUR NEGATIVE 06/08/2021 1454   KETONESUR NEGATIVE 06/08/2021 1454   PROTEINUR >300 (A) 06/08/2021 1454   NITRITE NEGATIVE 06/08/2021 1454   LEUKOCYTESUR LARGE (A) 06/08/2021 1454   Sepsis Labs Invalid input(s): PROCALCITONIN,  WBC,  LACTICIDVEN Microbiology Recent Results (from the past 240 hour(s))  Blood culture (routine x 2)     Status: None (Preliminary result)   Collection Time: 06/08/21  2:54 PM   Specimen: BLOOD  Result Value Ref Range Status   Specimen Description BLOOD RIGHT ANTECUBITAL  Final   Special Requests   Final    BOTTLES DRAWN AEROBIC AND ANAEROBIC Blood Culture adequate volume   Culture   Final    NO GROWTH 2 DAYS Performed at [facility], [address] [place], [place] [address]    Report Status PENDING  Incomplete  Urine Culture     Status: Abnormal   Collection Time: 06/08/21  2:54 PM   Specimen: Urine, Clean Catch  Result Value Ref Range Status   Specimen Description   Final    URINE, CLEAN CATCH Performed at [facility], [address], [place], [place] [address]    Special Requests   Final    NONE Performed at [facility], [address], [place], [place] [address]    Culture >=100,000 COLONIES/mL ESCHERICHIA COLI (A)  Final   Report Status  06/10/2021 FINAL  Final   Organism ID, Bacteria ESCHERICHIA COLI (A)  Final      Susceptibility   Escherichia coli - MIC*    AMPICILLIN 4 SENSITIVE Sensitive     CEFAZOLIN <=4 SENSITIVE Sensitive     CEFEPIME <=0.12 SENSITIVE Sensitive     CEFTRIAXONE <=0.25 SENSITIVE Sensitive     CIPROFLOXACIN <=0.25 SENSITIVE Sensitive     GENTAMICIN <=1 SENSITIVE Sensitive     IMIPENEM <=0.25 SENSITIVE Sensitive     NITROFURANTOIN <=16 SENSITIVE Sensitive     TRIMETH/SULFA <=20 SENSITIVE Sensitive     AMPICILLIN/SULBACTAM <=2 SENSITIVE Sensitive     PIP/TAZO <=4 SENSITIVE Sensitive     * >=100,000 COLONIES/mL ESCHERICHIA COLI  Resp Panel by RT-PCR (Flu A&B, Covid) Nasopharyngeal Swab     Status: Abnormal   Collection Time: 06/08/21  3:06 PM   Specimen: Nasopharyngeal Swab; Nasopharyngeal(NP) swabs in vial transport medium  Result Value Ref Range Status   SARS Coronavirus 2 by RT PCR POSITIVE (A) NEGATIVE Final    Comment: RESULT CALLED TO, READ BACK BY AND VERIFIED WITH: T CARBONE,RN 1614 06/08/21 DBRADLEY (NOTE) SARS-CoV-2 target nucleic acids are DETECTED.  The SARS-CoV-2 RNA is generally detectable in upper respiratory specimens during the acute phase of infection. Positive results are indicative of the presence of the identified virus, but do not rule out bacterial infection or co-infection with other pathogens not detected by the test. Clinical correlation with patient history and other diagnostic information is necessary to determine patient infection status. The expected result is Negative.  Fact Sheet for Patients: BloggerCourse.comhttps://www.fda.gov/media/152166/download  Fact Sheet for Healthcare Providers: SeriousBroker.ithttps://www.fda.gov/media/152162/download  This test is not yet approved or cleared by the [place] FDA and  has been authorized for detection and/or diagnosis of SARS-CoV-2 by FDA under an Emergency Use Authorization (EUA).  This EUA will remain in effect (meaning this test can be   used) for the duration of  the COVID-19 declaration under Section 564(b)(1) of the Act, 21 U.S.C. section 360bbb-3(b)(1), unless the authorization is terminated or revoked sooner.     Influenza A by PCR NEGATIVE NEGATIVE Final   Influenza B by PCR NEGATIVE NEGATIVE Final    Comment: (NOTE) The Xpert Xpress SARS-CoV-2/FLU/RSV plus assay is intended as an aid in the diagnosis of influenza from Nasopharyngeal swab specimens and should not be used as a sole basis for treatment. Nasal washings and aspirates are unacceptable for Xpert Xpress SARS-CoV-2/FLU/RSV testing.  Fact Sheet for Patients: BloggerCourse.com  Fact Sheet for Healthcare Providers: SeriousBroker.it  This test is not yet approved or cleared by the [place] FDA and has been authorized for detection and/or diagnosis of SARS-CoV-2 by FDA under an Emergency Use Authorization (EUA). This EUA will remain in effect (meaning this test can be used) for the duration of the COVID-19 declaration under Section 564(b)(1) of the Act, 21 U.S.C. section 360bbb-3(b)(1), unless the authorization is terminated or revoked.  Performed at [facility], [address] [place], [place] [address]   Blood culture (routine x 2)     Status: None (Preliminary result)   Collection Time: 06/08/21  4:39 PM   Specimen: BLOOD  Result Value Ref Range Status   Specimen Description BLOOD LEFT ANTECUBITAL  Final   Special Requests   Final    BOTTLES DRAWN AEROBIC AND ANAEROBIC Blood Culture adequate volume   Culture   Final    NO GROWTH 2 DAYS Performed at [facility], [address] [place], [place] [address]    Report Status PENDING  Incomplete  MRSA Next Gen by PCR, Nasal     Status: None   Collection Time: 06/08/21 10:09 PM   Specimen: Nasal Mucosa; Nasal Swab  Result Value Ref Range Status   MRSA by PCR Next Gen NOT DETECTED NOT DETECTED Final    Comment:  (NOTE) The GeneXpert MRSA Assay (FDA approved for NASAL specimens only), is one component of a comprehensive MRSA colonization surveillance program. It is not intended to diagnose MRSA infection nor to guide or monitor treatment for MRSA infections. Test performance is not FDA approved in patients less than 87 years old. Performed at [facility], [address], [place], [place] [address]      Time coordinating discharge: Over 30 minutes  SIGNED:   Azucena Fallen, DO [facility] 06/11/2021, 11:12 AM Pager   If 7PM-7AM, please contact night-coverage [email or web address]

## 2021-06-13 ENCOUNTER — Telehealth: Payer: Self-pay

## 2021-06-13 LAB — CULTURE, BLOOD (ROUTINE X 2)
Culture: NO GROWTH
Culture: NO GROWTH
Special Requests: ADEQUATE
Special Requests: ADEQUATE

## 2021-06-13 NOTE — Telephone Encounter (Signed)
Transition Care Management Follow-up Telephone Call Date of discharge and from where: 9[facility] How have you been since you were released from the hospital? Patient stated she is doing ok.  Any questions or concerns? No  Items Reviewed: Did the pt receive and understand the discharge instructions provided? Yes  Medications obtained and verified? Yes  Other? No  Any new allergies since your discharge? No  Dietary orders reviewed? N/A Do you have support at home? Yes   Home Care and Equipment/Supplies: Were home health services ordered? not applicable If so, what is the name of the agency? N/A  Has the agency set up a time to come to the patient's home? not applicable Were any new equipment or medical supplies ordered?  No What is the name of the medical supply agency? N/A Were you able to get the supplies/equipment? not applicable Do you have any questions related to the use of the equipment or supplies? No  Functional Questionnaire: (I = Independent and D = Dependent) ADLs: I  Bathing/Dressing- I  Meal Prep- I  Eating- I  Maintaining continence- I  Transferring/Ambulation- I  Managing Meds- I  Follow up appointments reviewed:  PCP Hospital f/u appt confirmed? No   Specialist Hospital f/u appt confirmed? No   Are transportation arrangements needed? No  If their condition worsens, is the pt aware to call PCP or go to the Emergency Dept.? Yes Was the patient provided with contact information for the PCP's office or ED? Yes Was to pt encouraged to call back with questions or concerns? Yes

## 2021-06-14 DIAGNOSIS — N12 Tubulo-interstitial nephritis, not specified as acute or chronic: Secondary | ICD-10-CM | POA: Diagnosis not present

## 2021-06-14 DIAGNOSIS — R112 Nausea with vomiting, unspecified: Secondary | ICD-10-CM | POA: Diagnosis not present

## 2021-06-29 ENCOUNTER — Other Ambulatory Visit: Payer: Self-pay

## 2021-06-29 ENCOUNTER — Ambulatory Visit (INDEPENDENT_AMBULATORY_CARE_PROVIDER_SITE_OTHER): Payer: Medicaid Other

## 2021-06-29 VITALS — BP 124/81 | HR 86 | Ht 64.0 in | Wt 134.0 lb

## 2021-06-29 DIAGNOSIS — Z3042 Encounter for surveillance of injectable contraceptive: Secondary | ICD-10-CM

## 2021-06-29 MED ORDER — MEDROXYPROGESTERONE ACETATE 150 MG/ML IM SUSP: 150.0000 mg | INTRAMUSCULAR | 4 refills | Status: DC

## 2021-06-29 MED ORDER — MEDROXYPROGESTERONE ACETATE 150 MG/ML IM SUSP
150.0000 mg | Freq: Once | INTRAMUSCULAR | Status: AC
  Administered 2021-06-29: 150 mg via INTRAMUSCULAR

## 2021-06-29 NOTE — Progress Notes (Signed)
Agree with A & P. 

## 2021-06-29 NOTE — Patient Instructions (Signed)
asp

## 2021-06-29 NOTE — Progress Notes (Addendum)
SUBJECTIVE: Danielle Carter is a 25 y.o. female who presents for DEPO Injection.  OBJECTIVE: Appears well, in no apparent distress.  Vital signs are normal.   ASSESSMENT: BC, patient is on time for Depo.  PLAN:  Office stock DEPO Injection given in RD, tolerated well. 2.   Next DEPO due Dec. 14-28, 2022. 3.   RX sent to Pharmacy, patient to prior to          next appt.  4.  Keep Postpartum Visit appt.  Administrations This Visit     medroxyPROGESTERone (DEPO-PROVERA) injection 150 mg     Admin Date 06/29/2021 Action Given Dose 150 mg Route Intramuscular Administered By Maretta Bees, RMA

## 2021-07-08 DIAGNOSIS — N390 Urinary tract infection, site not specified: Secondary | ICD-10-CM | POA: Diagnosis not present

## 2021-07-08 DIAGNOSIS — M549 Dorsalgia, unspecified: Secondary | ICD-10-CM | POA: Diagnosis not present

## 2021-07-19 ENCOUNTER — Telehealth (INDEPENDENT_AMBULATORY_CARE_PROVIDER_SITE_OTHER): Payer: Medicaid Other | Admitting: Obstetrics and Gynecology

## 2021-07-19 DIAGNOSIS — O99893 Other specified diseases and conditions complicating puerperium: Secondary | ICD-10-CM

## 2021-07-19 DIAGNOSIS — R3915 Urgency of urination: Secondary | ICD-10-CM

## 2021-07-19 NOTE — Progress Notes (Signed)
Provider location: Center for [facility]   Patient location: Home  I connected withNAME@ on 07/19/21 at  1:10 PM EDT by Mychart Video Encounter and verified that I am speaking with the correct person using two identifiers.       I discussed the limitations, risks, security and privacy concerns of performing an evaluation and management service virtually and the availability of in person appointments. I also discussed with the patient that there may be a patient responsible charge related to this service. The patient expressed understanding and agreed to proceed.  Post Partum Visit Note Subjective:   Danielle Carter is a 25 y.o. [id number] female who presents for a postpartum visit. She is  14  weeks postpartum following a normal spontaneous vaginal delivery.  I have fully reviewed the prenatal and intrapartum course. The delivery was at 39 gestational weeks.  Anesthesia: epidural. Postpartum course has been unremarkable. Baby is doing well. Baby is feeding by bottle - Gerber Gentle . Bleeding no bleeding. Bowel function is normal. Bladder function is normal. Patient is sexually active. Contraception method is Depo-Provera injections. Postpartum depression screening: negative.   Upstream - 07/19/21 1312       Pregnancy Intention Screening   Does the patient want to become pregnant in the next year? No    Does the patient's partner want to become pregnant in the next year? No    Would the patient like to discuss contraceptive options today? No      Contraception Wrap Up   Current Method Hormonal Injection            The pregnancy intention screening data noted above was reviewed. Potential methods of contraception were discussed. The patient elected to proceed with No data recorded.   Edinburgh Postnatal Depression Scale - 07/19/21 1311       Edinburgh Postnatal Depression Scale:  In the Past 7 Days   I have been able to laugh and see the funny side of things. 0     I have looked forward with enjoyment to things. 0    I have blamed myself unnecessarily when things went wrong. 0    I have been anxious or worried for no good reason. 0    I have felt scared or panicky for no good reason. 0    Things have been getting on top of me. 0    I have been so unhappy that I have had difficulty sleeping. 0    I have felt sad or miserable. 0    I have been so unhappy that I have been crying. 0    The thought of harming myself has occurred to me. 0    Edinburgh Postnatal Depression Scale Total 0             The following portions of the patient's history were reviewed and updated as appropriate: allergies, current medications, past family history, past medical history, past social history, past surgical history, and problem list.  Review of Systems Pertinent items are noted in HPI.  Objective:  There were no vitals taken for this visit.    General:  Alert, oriented and cooperative. Patient is in no acute distress.  Respiratory: Normal respiratory effort, no problems with respiration noted  Mental Status: Normal mood and affect. Normal behavior. Normal judgment and thought content.  Rest of physical exam deferred due to type of encounter   Assessment:    Virtual  postpartum exam. Possible urge incontinence ? Vaginal  hypoestrogenism  Plan:  Essential components of care per ACOG recommendations:  1.  Mood and well being: Patient with negative depression screening today. Reviewed local resources for support.  - Patient  is currently vaping in an effort to trend toward not smoking.   - hx of drug use? No    2. Infant care and feeding:  -Patient currently breastmilk feeding? No   -Social determinants of health (SDOH) reviewed in EPIC. No concerns/  3. Sexuality, contraception and birth spacing - Patient does not want a pregnancy in the next year.  Desired family size is 3 children.  - Reviewed forms of contraception in tiered fashion. Patient desired  Depo-Provera today.   - Discussed birth spacing of 18 months  4. Sleep and fatigue -Encouraged family/partner/community support of 4 hrs of uninterrupted sleep to help with mood and fatigue  5. Physical Recovery  - Discussed patients delivery and complications - Patient had a labial  laceration, perineal healing reviewed. Patient expressed understanding - Patient has urinary incontinence? No - Patient is safe to resume physical and sexual activity  6.  Health Maintenance - Last pap smear done 10/26/20 and was normal with negative HPV.  7. Chronic Disease: Pt had no pregnancy related issues. - PCP follow up  Pt does note some urinary urgency and a recent admission for pyelonephritis in September. Nocturia x 1. Pt is concerned about possible incontinence and would like evaluation with urogynecology. Referral sent.  She also notes increased vaginal dryness which may also be associated with the depo provera.  If not improving in the coming months, consider a short trial of vaginal premarin to see if that improves the discomfort.  I provided 20 minutes of face-to-face time during this encounter.    No follow-ups on file.  Future Appointments  Date Time Provider Department Center  09/19/2021 10:20 AM [facility] NURSE [facility] None     Center for [employer], [facility]

## 2021-09-12 ENCOUNTER — Ambulatory Visit: Payer: Medicaid Other | Admitting: Obstetrics and Gynecology

## 2021-09-19 ENCOUNTER — Other Ambulatory Visit: Payer: Self-pay

## 2021-09-19 ENCOUNTER — Ambulatory Visit (INDEPENDENT_AMBULATORY_CARE_PROVIDER_SITE_OTHER): Payer: Medicaid Other

## 2021-09-19 VITALS — BP 135/84 | HR 103 | Ht 64.0 in | Wt 132.0 lb

## 2021-09-19 DIAGNOSIS — Z3042 Encounter for surveillance of injectable contraceptive: Secondary | ICD-10-CM

## 2021-09-19 MED ORDER — MEDROXYPROGESTERONE ACETATE 150 MG/ML IM SUSP
150.0000 mg | Freq: Once | INTRAMUSCULAR | Status: AC
  Administered 2021-09-19: 11:00:00 150 mg via INTRAMUSCULAR

## 2021-09-19 NOTE — Progress Notes (Signed)
Depo Provera 150mg  given IM left deltoid. Pt tolerated well with no adverse side effects noted. Pt to return to clinic between 12/05/21-12/19/21. Pt is up to date on yearly exam, due October 2023.  Office stock used for patient injection. Rx at pharmacy. Pt advised to pick up Rx before next injection.

## 2021-09-21 DIAGNOSIS — N23 Unspecified renal colic: Secondary | ICD-10-CM | POA: Diagnosis not present

## 2021-09-21 DIAGNOSIS — N390 Urinary tract infection, site not specified: Secondary | ICD-10-CM | POA: Diagnosis not present

## 2021-10-11 DIAGNOSIS — N390 Urinary tract infection, site not specified: Secondary | ICD-10-CM | POA: Diagnosis not present

## 2021-10-11 DIAGNOSIS — Z87448 Personal history of other diseases of urinary system: Secondary | ICD-10-CM | POA: Diagnosis not present

## 2021-10-21 ENCOUNTER — Ambulatory Visit: Payer: Medicaid Other | Admitting: Obstetrics and Gynecology

## 2021-11-18 ENCOUNTER — Ambulatory Visit: Payer: Medicaid Other | Admitting: Obstetrics and Gynecology

## 2021-11-18 DIAGNOSIS — N939 Abnormal uterine and vaginal bleeding, unspecified: Secondary | ICD-10-CM | POA: Diagnosis not present

## 2021-11-18 DIAGNOSIS — R102 Pelvic and perineal pain: Secondary | ICD-10-CM | POA: Diagnosis not present

## 2021-11-19 ENCOUNTER — Emergency Department (HOSPITAL_BASED_OUTPATIENT_CLINIC_OR_DEPARTMENT_OTHER): Payer: Medicaid Other

## 2021-11-19 ENCOUNTER — Encounter (HOSPITAL_BASED_OUTPATIENT_CLINIC_OR_DEPARTMENT_OTHER): Payer: Self-pay | Admitting: Emergency Medicine

## 2021-11-19 ENCOUNTER — Other Ambulatory Visit: Payer: Self-pay

## 2021-11-19 ENCOUNTER — Emergency Department (HOSPITAL_BASED_OUTPATIENT_CLINIC_OR_DEPARTMENT_OTHER)
Admission: EM | Admit: 2021-11-19 | Discharge: 2021-11-19 | Disposition: A | Discharge status: Home / Self Care | Payer: Medicaid Other | Attending: Emergency Medicine | Admitting: Emergency Medicine

## 2021-11-19 DIAGNOSIS — Z91040 Latex allergy status: Secondary | ICD-10-CM | POA: Diagnosis not present

## 2021-11-19 DIAGNOSIS — N939 Abnormal uterine and vaginal bleeding, unspecified: Secondary | ICD-10-CM | POA: Insufficient documentation

## 2021-11-19 DIAGNOSIS — R102 Pelvic and perineal pain: Secondary | ICD-10-CM

## 2021-11-19 DIAGNOSIS — K5900 Constipation, unspecified: Secondary | ICD-10-CM | POA: Diagnosis not present

## 2021-11-19 DIAGNOSIS — R1032 Left lower quadrant pain: Secondary | ICD-10-CM | POA: Diagnosis not present

## 2021-11-19 DIAGNOSIS — N393 Stress incontinence (female) (male): Secondary | ICD-10-CM | POA: Diagnosis not present

## 2021-11-19 LAB — COMPREHENSIVE METABOLIC PANEL
ALT: 7 U/L (ref 0–44)
AST: 10 U/L — ABNORMAL LOW (ref 15–41)
Albumin: 4.1 g/dL (ref 3.5–5.0)
Alkaline Phosphatase: 49 U/L (ref 38–126)
Anion gap: 8 (ref 5–15)
BUN: 14 mg/dL (ref 6–20)
CO2: 22 mmol/L (ref 22–32)
Calcium: 8.9 mg/dL (ref 8.9–10.3)
Chloride: 109 mmol/L (ref 98–111)
Creatinine, Ser: 0.72 mg/dL (ref 0.44–1.00)
GFR, Estimated: >60 mL/min (ref >60)
Glucose, Bld: 90 mg/dL (ref 70–99)
Potassium: 4 mmol/L (ref 3.5–5.1)
Sodium: 139 mmol/L (ref 135–145)
Total Bilirubin: 0.3 mg/dL (ref 0.3–1.2)
Total Protein: 7 g/dL (ref 6.5–8.1)

## 2021-11-19 LAB — URINALYSIS, ROUTINE W REFLEX MICROSCOPIC
Bilirubin Urine: NEGATIVE
Glucose, UA: NEGATIVE mg/dL
Hgb urine dipstick: NEGATIVE
Ketones, ur: NEGATIVE mg/dL
Leukocytes,Ua: NEGATIVE
Nitrite: NEGATIVE
Protein, ur: NEGATIVE mg/dL
Specific Gravity, Urine: 1.026 (ref 1.005–1.030)
pH: 5.5 (ref 5.0–8.0)

## 2021-11-19 LAB — CBC WITH DIFFERENTIAL/PLATELET
Abs Immature Granulocytes: 0.02 10*3/uL (ref 0.00–0.07)
Basophils Absolute: 0 10*3/uL (ref 0.0–0.1)
Basophils Relative: 0 %
Eosinophils Absolute: 0.1 10*3/uL (ref 0.0–0.5)
Eosinophils Relative: 1 %
HCT: 36.5 % (ref 36.0–46.0)
Hemoglobin: 11.4 g/dL — ABNORMAL LOW (ref 12.0–15.0)
Immature Granulocytes: 0 %
Lymphocytes Relative: 33 %
Lymphs Abs: 2 10*3/uL (ref 0.7–4.0)
MCH: 26.3 pg (ref 26.0–34.0)
MCHC: 31.2 g/dL (ref 30.0–36.0)
MCV: 84.3 fL (ref 80.0–100.0)
Monocytes Absolute: 0.4 10*3/uL (ref 0.1–1.0)
Monocytes Relative: 6 %
Neutro Abs: 3.6 10*3/uL (ref 1.7–7.7)
Neutrophils Relative %: 60 %
Platelets: 221 10*3/uL (ref 150–400)
RBC: 4.33 MIL/uL (ref 3.87–5.11)
RDW: 15.3 % (ref 11.5–15.5)
WBC: 6 10*3/uL (ref 4.0–10.5)
nRBC: 0 % (ref 0.0–0.2)

## 2021-11-19 LAB — HCG, SERUM, QUALITATIVE: Preg, Serum: NEGATIVE

## 2021-11-19 MED ORDER — NAPROXEN 500 MG PO TABS: 500.0000 mg | ORAL_TABLET | Freq: Two times a day (BID) | ORAL | 0 refills | Status: AC

## 2021-11-19 NOTE — ED Notes (Signed)
Pt reports blood was bright red

## 2021-11-19 NOTE — ED Provider Notes (Signed)
[facility] EMERGENCY DEPT Provider Note   CSN: [id number] Arrival date & time: 11/19/21  1149     History  Chief Complaint  Patient presents with   Abdominal Pain    Danielle Carter is a 26 y.o. female.  Patient with no pertinent past medical history presents today with chief complaint of left lower pelvic pain.  She states that same has been ongoing for the last 2 days, is intermittent and cramping in nature and does not radiate.  States that she has also had some vaginal bleeding over the last 2 days but is not currently bleeding.  Patient states that she has been getting monthly Depo shots for the past 7 months and has not missed any appointments.  States that since she started getting the shots, around this time of the month she usually has some dark brown spotting that lasts for a few days, however she does not normally have a typical menstrual cycle with bleeding or pain.  She states that the pain is worse in the morning and that she has been waking up with some nausea with the pain.  Denies any vomiting or diarrhea, states that she has had some constipation but that this is normal for her.  Denies any dysuria, vaginal discharge, or vaginal pain.  No fevers or chills.  The history is provided by the patient. No language interpreter was used.  Abdominal Pain Associated symptoms: nausea (not currently) and vaginal bleeding   Associated symptoms: no chills, no diarrhea, no dysuria, no fever, no vaginal discharge and no vomiting       Home Medications Prior to Admission medications   Medication Sig Start Date End Date Taking? Authorizing Provider  medroxyPROGESTERone (DEPO-PROVERA) 150 MG/ML injection Inject 1 mL (150 mg total) into the muscle every 3 (three) months. 06/29/21   Hermina Staggers, MD      Allergies    Ceftriaxone, Abilify [aripiprazole], Nickel, Pristiq [desvenlafaxine], Rocephin [ceftriaxone sodium in dextrose], Latex, and Morphine    Review of  Systems   Review of Systems  Constitutional:  Negative for chills and fever.  Gastrointestinal:  Positive for nausea (not currently). Negative for abdominal pain, diarrhea and vomiting.  Genitourinary:  Positive for pelvic pain and vaginal bleeding. Negative for dysuria, vaginal discharge and vaginal pain.  Musculoskeletal:  Negative for back pain.  All other systems reviewed and are negative.  Physical Exam Updated Vital Signs BP 110/67    Pulse 89    Temp 98.7 F (37.1 C) (Oral)    Resp 18    Ht 5\' 4"  (1.626 m)    Wt 62.1 kg    SpO2 96%    BMI 23.52 kg/m  Physical Exam Vitals and nursing note reviewed.  Constitutional:      General: She is not in acute distress.    Appearance: Normal appearance. She is well-developed and normal weight. She is not ill-appearing, toxic-appearing or diaphoretic.     Comments: Patient resting comfortably in bed in no acute distress  HENT:     Head: Normocephalic and atraumatic.  Cardiovascular:     Rate and Rhythm: Normal rate.  Pulmonary:     Effort: Pulmonary effort is normal. No respiratory distress.  Abdominal:     General: Abdomen is flat. Bowel sounds are normal.     Palpations: Abdomen is soft.     Tenderness: There is abdominal tenderness in the left lower quadrant. There is no right CVA tenderness, left CVA tenderness, guarding or rebound. Negative  signs include Murphy's sign, Rovsing's sign, McBurney's sign and psoas sign.     Hernia: No hernia is present.  Musculoskeletal:        General: Normal range of motion.     Cervical back: Normal range of motion.  Skin:    General: Skin is warm and dry.  Neurological:     General: No focal deficit present.     Mental Status: She is alert.  Psychiatric:        Mood and Affect: Mood normal.        Behavior: Behavior normal.    ED Results / Procedures / Treatments   Labs (all labs ordered are listed, but only abnormal results are displayed) Labs Reviewed  CBC WITH DIFFERENTIAL/PLATELET -  Abnormal; Notable for the following components:      Result Value   Hemoglobin 11.4 (*)    All other components within normal limits  COMPREHENSIVE METABOLIC PANEL - Abnormal; Notable for the following components:   AST 10 (*)    All other components within normal limits  HCG, SERUM, QUALITATIVE  URINALYSIS, ROUTINE W REFLEX MICROSCOPIC    EKG None  Radiology US PELVIC COMPLETE W TRANSVAGINAL AND TORSION R/O  Result Date: 11/19/2021 CLINICAL DATA:  Left lower quadrant pain, heavy vaginal bleeding for 2 days EXAM: TRANSABDOMINAL AND TRANSVAGINAL ULTRASOUND OF PELVIS DOPPLER ULTRASOUND OF OVARIES TECHNIQUE: Both transabdominal and transvaginal ultrasound examinations of the pelvis were performed. Transabdominal technique was performed for global imaging of the pelvis including uterus, ovaries, adnexal regions, and pelvic cul-de-sac. It was necessary to proceed with endovaginal exam following the transabdominal exam to visualize the uterus, endometrium, ovaries, and adnexa. Color and duplex Doppler ultrasound was utilized to evaluate blood flow to the ovaries. COMPARISON:  None. FINDINGS: Uterus Measurements: 8.4 x 4.3 x 5.3 cm = volume: 100 mL. No fibroids or other mass visualized. Endometrium Thickness: 4 mm.  No focal abnormality visualized. Right ovary Measurements: 3.4 x 1.8 x 2.0 cm = volume: 6 mL. Normal appearance/no adnexal mass. Multiple small follicles. Left ovary Measurements: 3.1 x 1.7 x 2.0 cm = volume: 6 mL. Normal appearance/no adnexal mass. Pulsed Doppler evaluation of both ovaries demonstrates normal low-resistance arterial and venous waveforms. Other findings Trace free fluid in the low pelvis. IMPRESSION: 1. No ultrasound abnormality of the pelvis to explain left lower quadrant pain. Normal ultrasound appearance of the uterus and bilateral ovaries. 2. Trace, nonspecific free fluid in the low pelvis, likely functional in the reproductive age setting. Electronically Signed   By: Jearld Lesch M.D.   On: 11/19/2021 17:03    Procedures Procedures    Medications Ordered in ED Medications - No data to display  ED Course/ Medical Decision Making/ A&P                           Medical Decision Making Amount and/or Complexity of Data Reviewed Labs: ordered. Radiology: ordered. ECG/medicine tests: ordered.  Risk Prescription drug management.   This patient presents to the ED for concern of LLQ pelvic pain and vaginal bleeding, this involves an extensive number of treatment options, and is a complaint that carries with it a high risk of complications and morbidity.  The differential diagnosis includes ectopic pregnancy, ovarian torsion, UTI. This list is not comprehensive   Lab Tests:  I Ordered, and personally interpreted labs.  The pertinent results include:  UA noninfectious, no leukocytosis, anemia unchanged from baseline.  No electrolyte abnormalities or  changes in kidney or liver function.  hCG negative   Imaging Studies ordered:  I ordered imaging studies including pelvic ultrasound I independently visualized and interpreted imaging which showed  1. No ultrasound abnormality of the pelvis to explain left lower quadrant pain. Normal ultrasound appearance of the uterus and bilateral ovaries. 2. Trace, nonspecific free fluid in the low pelvis, likely functional in the reproductive age setting. I agree with the radiologist interpretation    Test Considered:  I considered pelvic exam with STD testing, however patient adamantly states that she is in a monogamous relationship and denies any STD symptoms.  Declines STD testing at this time.   Dispostion:  After consideration of the diagnostic results and the patients response to treatment, I feel that the patent would benefit from outpatient management with close OB follow-up.  Patient is nontoxic, nonseptic appearing, in no apparent distress.  Patient's pain and other symptoms adequately managed in  emergency department. Labs, imaging and vitals reviewed.  Patient does not meet the SIRS or Sepsis criteria.  On repeat exam patient does not have a surgical abdomin and there are no peritoneal signs.  No indication of appendicitis, bowel obstruction, bowel perforation, cholecystitis, diverticulitis, ovarian torsion, or ectopic pregnancy.  Suspect that patient's pain could be her menstrual cycle in the absence of any other acute findings considering she does state that she normally has spotting around this time of the month.  Discussed this with patient who is in agreement.  Patient discharged home with symptomatic treatment and given strict instructions for follow-up with their primary care physician.  I have also discussed reasons to return immediately to the ER.  Patient expresses understanding and agrees with plan.  Discharged in stable condition.  Findings and plan of care discussed with supervising physician Dr. Wilkie Aye who is in agreement.     Final Clinical Impression(s) / ED Diagnoses Final diagnoses:  Pelvic pain in female    Rx / DC Orders ED Discharge Orders          Ordered    naproxen (NAPROSYN) 500 MG tablet  2 times daily        11/19/21 1740          An After Visit Summary was printed and given to the patient.     Silva Bandy, PA-C 11/19/21 2253    Rozelle Logan, DO 11/20/21 1555

## 2021-11-19 NOTE — ED Triage Notes (Signed)
Pt from home c/o of constant pelvic pain. Pt had a depo shot  7 months ago and does not have periods. Pt stated that yesterday she started bleeding heavily and pain in the lower left pelvic area. Pt reports  the bleeding has stopped but the pain has continued at a 7 out of 10.Marland Kitchen Pt reports mild nausea but bo episodes of emesis

## 2021-11-19 NOTE — Discharge Instructions (Signed)
As we discussed, your work-up today in the ER was reassuring for acute abnormalities.  I have written you a prescription for naproxen for your pain.  Please take this as prescribed as needed for pain.  Follow-up with your OB GYN in the next few days for further evaluation and management.  Return if development of any new or worsening symptoms.

## 2021-11-19 NOTE — ED Notes (Signed)
Patient transported to Ultrasound 

## 2021-11-21 ENCOUNTER — Other Ambulatory Visit: Payer: Self-pay | Admitting: Obstetrics and Gynecology

## 2021-11-21 NOTE — Patient Outreach (Signed)
Care Coordination  11/21/2021  [name] 09/04/1996 [id number]   Medicaid Managed Care   Unsuccessful Outreach Note  11/21/2021 Name: Danielle Carter MRN: [id number] DOB: Mar 21, 1996  Referred by: Barnie Mort, PA-C Reason for referral : High Risk Managed Medicaid (Unsuccessful telephone outreach)   An unsuccessful telephone outreach was attempted today. The patient was referred to the case management team for assistance with care management and care coordination.   Follow Up Plan: The care management team will reach out to the patient again over the next 7 days.   Kathi Der RN, BSN [place]   [employer] - Managed Medicaid High Risk [phone]

## 2021-11-21 NOTE — Patient Instructions (Signed)
Hi [name] am sorry that I missed you today - as a part of your Medicaid benefit, you are eligible for care management and care coordination services at no cost or copay. I was unable to reach you by phone today but would be happy to help you with your health related needs. Please feel free to call me at [phone]  A member of the Managed Medicaid care management team will reach out to you again over the next 7 days.   Kathi Der RN, BSN [place]   [employer] - Managed Medicaid High Risk [phone]

## 2021-11-22 ENCOUNTER — Ambulatory Visit: Payer: Medicaid Other | Admitting: Obstetrics

## 2021-12-09 ENCOUNTER — Ambulatory Visit (INDEPENDENT_AMBULATORY_CARE_PROVIDER_SITE_OTHER): Payer: Medicaid Other

## 2021-12-09 ENCOUNTER — Other Ambulatory Visit: Payer: Self-pay

## 2021-12-09 DIAGNOSIS — Z3042 Encounter for surveillance of injectable contraceptive: Secondary | ICD-10-CM

## 2021-12-09 MED ORDER — MEDROXYPROGESTERONE ACETATE 150 MG/ML IM SUSP
150.0000 mg | INTRAMUSCULAR | Status: AC
  Administered 2021-12-09: 150 mg via INTRAMUSCULAR

## 2021-12-09 NOTE — Progress Notes (Addendum)
Pt is in the office for depo injection. Administered in [place] and pt tolerated well. Next due May 26- June 9. ?.. ?Administrations This Visit   ? ? medroxyPROGESTERone (DEPO-PROVERA) injection 150 mg   ? ? Admin Date ?12/09/2021 Action ?Given Dose ?150 mg Route ?Intramuscular Administered By ?Hinton Lovely, RN  ? ?  ?  ? ?  ?Hinton Lovely, RN ? ?Patient was assessed and managed by nursing staff during this encounter. I have reviewed the chart and agree with the documentation and plan. I have also made any necessary editorial changes. ? ?Verita Schneiders, MD ?12/09/2021 11:30 AM  ? ?

## 2021-12-19 NOTE — Progress Notes (Deleted)
[facility] ?New Patient Evaluation and Consultation ? ?Referring Provider: Warden Fillers, MD ?PCP: Barnie Mort, PA-C ?Date of Service: 12/20/2021 ? ?SUBJECTIVE ?Chief Complaint: No chief complaint on file. ? ?History of Present Illness: Danielle Carter is a 26 y.o. {ED SANE [phone] female seen in consultation at the request of Dr. Donavan Foil for evaluation of ***.   ? ?***Review of records significant for: ?Has some incontinence postpartum. She is s/p NSVD on 04/09/21. ? ?Urinary Symptoms: ?{urine leakage?:24754} ?Leaks *** time(s) per {days/wks/mos/yrs:310907}.  ?Pad use: {NUMBERS 1-10:18281} {pad option:24752} per day.   ?She {ACTION; IS/IS XLK:44010272} bothered by her UI symptoms. ? ?Day time voids ***.  Nocturia: *** times per night to void. ?Voiding dysfunction: she {empties:24755} her bladder well.  ?{DOES NOT does:27190::"does not"} use a catheter to empty bladder.  ?When urinating, she feels {urine symptoms:24756} ?Drinks: *** per day ? ?UTIs: {NUMBERS 1-10:18281} UTI's in the last year.   ?{ACTIONS;DENIES/REPORTS:21021675::"Denies"} history of {urologic concerns:24757} ? ?Pelvic Organ Prolapse Symptoms:                  ?She {denies/ admits to:24761} a feeling of a bulge the vaginal area. It has been present for {NUMBER 1-10:22536} {days/wks/mos/yrs:310907}.  ?She {denies/ admits to:24761} seeing a bulge.  ?This bulge {ACTION; IS/IS ZDG:64403474} bothersome. ? ?Bowel Symptom: ?Bowel movements: *** time(s) per {Time; day/week/month:13537} ?Stool consistency: {stool consistency:24758} ?Straining: {yes/no:19897}.  ?Splinting: {yes/no:19897}.  ?Incomplete evacuation: {yes/no:19897}.  ?She {denies/ admits to:24761} accidental bowel leakage / fecal incontinence ? Occurs: *** time(s) per {Time; day/week/month:13537} ? Consistency with leakage: {stool consistency:24758} ?Bowel regimen: {bowel regimen:24759} ?Last colonoscopy: Date ***, Results *** ? ?Sexual Function ?Sexually active:  {yes/no:19897}.  ?Sexual orientation: {Sexual Orientation:[phone]} ?Pain with sex: {pain with sex:24762} ? ?Pelvic Pain ?{denies/ admits to:24761} pelvic pain ?Location: *** ?Pain occurs: *** ?Prior pain treatment: *** ?Improved by: *** ?Worsened by: *** ? ? ?Past Medical History:  ?Past Medical History:  ?Diagnosis Date  ? Anemia   ? Anxiety   ? Depression   ? going well  ? Headache   ? 'occular'  ? Infection   ? UTI  ? PTSD (post-traumatic stress disorder)   ? Vaginal Pap smear, abnormal   ? ok since  ? ? ? ?Past Surgical History:   ?Past Surgical History:  ?Procedure Laterality Date  ? DRUG INDUCED ENDOSCOPY    ? ? ? ?Past OB/GYN History: ?G{NUMBERS 1-10:18281} P{NUMBERS 1-10:18281} ?Vaginal deliveries: ***,  Forceps/ Vacuum deliveries: ***, Cesarean section: *** ?Menopausal: {menopausal:24763} ?Contraception: ***. ?Last pap smear was ***.  Any history of abnormal pap smears: {yes/no:19897}. ? ? ?Medications: She has a current medication list which includes the following prescription(s): medroxyprogesterone, and the following Facility-Administered Medications: medroxyprogesterone.  ? ?Allergies: Patient is allergic to ceftriaxone, abilify [aripiprazole], nickel, pristiq [desvenlafaxine], rocephin [ceftriaxone sodium in dextrose], latex, and morphine.  ? ?Social History:  ?Social History  ? ?Tobacco Use  ? Smoking status: Former  ?  Packs/day: 0.50  ?  Types: Cigarettes  ?  Quit date: 12/10/2018  ?  Years since quitting: 3.0  ? Smokeless tobacco: Never  ?Vaping Use  ? Vaping Use: Some days  ?Substance Use Topics  ? Alcohol use: Never  ? Drug use: Never  ? ? ?Relationship status: {relationship status:24764} ?She lives with ***.   ?She {ACTION; IS/IS QVZ:56387564} employed ***. ?Regular exercise: {Yes/No:304960894} ?History of abuse: {Yes/No:304960894} ? ?Family History:   ?Family History  ?Problem Relation Age of Onset  ? Fibromyalgia Mother   ?  Seizures Mother   ? Pancreatitis Father   ? Graves' disease Father    ? Depression Father   ? ? ? ?Review of Systems: ROS ? ? ?OBJECTIVE ?Physical Exam: ?There were no vitals filed for this visit. ? ?Physical Exam ? ? ?GU / Detailed Urogynecologic Evaluation:  ?Pelvic Exam: Normal external female genitalia; Bartholin's and Skene's glands normal in appearance; urethral meatus normal in appearance, no urethral masses or discharge.  ? ?CST: {gen negative/positive:315881} ? ?Reflexes: bulbocavernosis {DESC; PRESENT/NOT PRESENT:21021351}, anocutaneous {DESC; PRESENT/NOT PRESENT:21021351} ***bilaterally. ? ?Speculum exam reveals normal vaginal mucosa {With/Without:20273} atrophy. Cervix {exam; gyn cervix:30847}. Uterus {exam; pelvic uterus:30849}. Adnexa {exam; adnexa:12223}.   ? ?s/p hysterectomy: Speculum exam reveals normal vaginal mucosa {With/Without:20273}  atrophy and normal vaginal cuff.  Adnexa {exam; adnexa:12223}.   ? ?With apex supported, anterior compartment defect was {reduced:24765} ? ?Pelvic floor strength {Roman # I-V:19040}/V, puborectalis {Roman # I-V:19040}/V external anal sphincter {Roman # I-V:19040}/V ? ?Pelvic floor musculature: Right levator {Tender/Non-tender:20250}, Right obturator {Tender/Non-tender:20250}, Left levator {Tender/Non-tender:20250}, Left obturator {Tender/Non-tender:20250} ? ?POP-Q:  ? ?POP-Q ? ?   ?                                          Aa   ?  ?                                          Ba  ?   ?                                            C  ? ?   ?                                          Gh  ?   ?                                          Pb  ?   ?                                          tvl  ? ?   ?                                          Ap  ?   ?                                          Bp  ?   ?  D  ? ? ? ?Rectal Exam:  ?Normal sphincter tone, {rectocele:24766} distal rectocele, enterocoele {DESC; PRESENT/NOT PRESENT:21021351}, no rectal masses, {sign of:24767} dyssynergia when asking the patient  to bear down. ? ?Post-Void Residual (PVR) by Bladder Scan: ?In order to evaluate bladder emptying, we discussed obtaining a postvoid residual and she agreed to this procedure. ? ?Procedure: The ultrasound unit was placed on the patient's abdomen in the suprapubic region after the patient had voided. A PVR of *** ml was obtained by bladder scan. ? ?Laboratory Results: ?@ENCLABS @  ? ?***I visualized the urine specimen, noting the specimen to be {urine color:24768} ? ?ASSESSMENT AND PLAN ?Ms. Khaimov is a 26 y.o. with: No diagnosis found. ? ? ? ?Marguerita BeardsMichelle N Lavonia Eager, MD ? ? ?Medical Decision Making:  ?- Reviewed/ ordered a clinical laboratory test ?- Reviewed/ ordered a radiologic study ?- Reviewed/ ordered medicine test ?- Decision to obtain old records ?- Discussion of management of or test interpretation with an external physician / other healthcare professional ? ?- Assessment requiring independent historian ?- Review and summation of prior records ?- Independent review of image, tracing or specimen ? ? ?

## 2021-12-20 ENCOUNTER — Ambulatory Visit: Payer: Medicaid Other | Admitting: Obstetrics and Gynecology

## 2022-02-27 ENCOUNTER — Ambulatory Visit: Payer: Medicaid Other

## 2022-02-28 ENCOUNTER — Ambulatory Visit (INDEPENDENT_AMBULATORY_CARE_PROVIDER_SITE_OTHER): Payer: Medicaid Other | Admitting: Emergency Medicine

## 2022-02-28 VITALS — BP 121/78 | HR 57 | Ht 64.0 in | Wt 145.3 lb

## 2022-02-28 DIAGNOSIS — Z3042 Encounter for surveillance of injectable contraceptive: Secondary | ICD-10-CM

## 2022-02-28 MED ORDER — MEDROXYPROGESTERONE ACETATE 150 MG/ML IM SUSP
150.0000 mg | Freq: Once | INTRAMUSCULAR | Status: AC
  Administered 2022-02-28: 150 mg via INTRAMUSCULAR

## 2022-02-28 NOTE — Progress Notes (Signed)
Date last pap: 10/26/20. Last Depo-Provera: 12/09/21. Side Effects if any: Spotting. Serum HCG indicated? NA. Depo-Provera 150 mg IM given by: Verlin Duke. Next appointment due Aug 15-Aug 29.

## 2022-03-21 ENCOUNTER — Ambulatory Visit: Payer: Medicaid Other | Admitting: Advanced Practice Midwife

## 2022-03-21 ENCOUNTER — Encounter: Payer: Self-pay | Admitting: Advanced Practice Midwife

## 2022-03-21 VITALS — BP 120/77 | HR 77 | Ht 64.0 in | Wt 151.1 lb

## 2022-03-21 DIAGNOSIS — N930 Postcoital and contact bleeding: Secondary | ICD-10-CM

## 2022-03-21 DIAGNOSIS — N393 Stress incontinence (female) (male): Secondary | ICD-10-CM

## 2022-03-21 DIAGNOSIS — R102 Pelvic and perineal pain: Secondary | ICD-10-CM

## 2022-03-21 DIAGNOSIS — N939 Abnormal uterine and vaginal bleeding, unspecified: Secondary | ICD-10-CM | POA: Diagnosis not present

## 2022-03-21 DIAGNOSIS — N921 Excessive and frequent menstruation with irregular cycle: Secondary | ICD-10-CM | POA: Insufficient documentation

## 2022-03-21 MED ORDER — NORGESTIMATE-ETH ESTRADIOL 0.25-35 MG-MCG PO TABS: 1.0000 | ORAL_TABLET | Freq: Every day | ORAL | 0 refills | Status: DC

## 2022-03-21 NOTE — Progress Notes (Signed)
Pt presents to discuss vaginal bleeding. Reports bleeding  and period-like cramps following intercourse and with external penetration following climax. Noticed 3 months ago. Does not have regular cycles, is on Depo.   Also reports urinary frequency and reports full bladder sensation.

## 2022-03-21 NOTE — Progress Notes (Signed)
   GYNECOLOGY PROGRESS NOTE  History:  26 y.o. [id number] presents to [facility] office today for problem gyn visit. She reports vaginal bleeding, heavy enough to require a pad, every time she has any sexual stimulation, and especially with orgasm.  The bleeding does not require penetration, and in fact, is worse without penetration.  The symptom started 4 months ago and is starting to affect her mental health and relationship.  She is using Depo Provera for birth control, which she started 1 year ago. Overall she is happy with the method and has had no problems until this started.  Also, she has urinary incontinence when her bladder is full, usually first thing in the morning. She has had mild stress incontinence with coughing/sneezing/laughing since her first pregnancy.  She reports some pelvic pain continues after her pubic symphysis pain during her last pregnancy, although it is much better than during pregnancy.   She denies h/a, dizziness, shortness of breath, n/v, or fever/chills.    The following portions of the patient's history were reviewed and updated as appropriate: allergies, current medications, past family history, past medical history, past social history, past surgical history and problem list. Last pap smear on 10/26/20 was normal.  Health Maintenance Due  Topic Date Due   COVID-19 Vaccine (1) Never done   HPV VACCINES (1 - 2-dose series) Never done     Review of Systems:  Pertinent items are noted in HPI.   Objective:  Physical Exam Blood pressure 120/77, pulse 77, height 5\' 4"  (1.626 m), weight 151 lb 1.6 oz (68.5 kg). VS reviewed, nursing note reviewed,  Constitutional: well developed, well nourished, no distress HEENT: normocephalic CV: normal rate Pulm/chest Renner: normal effort Breast Exam: deferred Abdomen: soft Neuro: alert and oriented x 3 Skin: warm, dry Psych: affect normal Pelvic exam: Deferred  Assessment & Plan:  1. Breakthrough bleeding on depo  provera --Bleeding is likely related to Depo and thin uterine lining.   --Options include changing birth control, using OCPs (both estrogen containing and Slynd progesterone only ) for short course and continuing Depo, or expectant management.  --Pt had good success with OCPs when she had bleeding with Nexplanon so will try OCPs now --OP ultrasound  --F/U in 3 months with Dr as she wants to discuss ablation if bleeding continues  - norgestimate-ethinyl estradiol (ORTHO-CYCLEN) 0.25-35 MG-MCG tablet; Take 1 tablet by mouth daily.  Dispense: 28 tablet; Refill: 0  2. Postcoital bleeding  - norgestimate-ethinyl estradiol (ORTHO-CYCLEN) 0.25-35 MG-MCG tablet; Take 1 tablet by mouth daily.  Dispense: 28 tablet; Refill: 0 - 06-18-1999 PELVIC COMPLETE WITH TRANSVAGINAL; Future  3. Abnormal uterine bleeding (AUB)  - US PELVIC COMPLETE WITH TRANSVAGINAL; Future  4. Stress incontinence in female --With pelvic pain, hx pubic symphysis separation  --PT during pregnancy helped  - Ambulatory referral to Physical Therapy  5. Pelvic pain in female  - Ambulatory referral to Physical Therapy   Return in about 3 months (around 06/21/2022) for With Dr 06/23/2022, Gyn follow up for Abnormal Uterine Bleeding/Discuss ablation.   Jolayne Panther, CNM 5:02 PM

## 2022-04-05 ENCOUNTER — Ambulatory Visit
Admission: RE | Admit: 2022-04-05 | Discharge: 2022-04-05 | Disposition: A | Discharge status: Home / Self Care | Payer: Medicaid Other | Source: Ambulatory Visit | Attending: Advanced Practice Midwife | Admitting: Advanced Practice Midwife

## 2022-04-05 DIAGNOSIS — N939 Abnormal uterine and vaginal bleeding, unspecified: Secondary | ICD-10-CM | POA: Diagnosis not present

## 2022-04-05 DIAGNOSIS — N930 Postcoital and contact bleeding: Secondary | ICD-10-CM | POA: Insufficient documentation

## 2022-04-25 ENCOUNTER — Encounter: Payer: Self-pay | Admitting: Advanced Practice Midwife

## 2022-04-25 ENCOUNTER — Telehealth: Payer: Self-pay | Admitting: *Deleted

## 2022-04-25 NOTE — Telephone Encounter (Signed)
Fax from pharmacy for refill on BC pill. Per your note, pt is on Depo and was given pill to help control BTB.  Do you want to refill pill at this time? Please send renewal if needed.

## 2022-04-28 ENCOUNTER — Other Ambulatory Visit (HOSPITAL_BASED_OUTPATIENT_CLINIC_OR_DEPARTMENT_OTHER): Payer: Self-pay | Admitting: Advanced Practice Midwife

## 2022-04-28 DIAGNOSIS — N921 Excessive and frequent menstruation with irregular cycle: Secondary | ICD-10-CM

## 2022-04-28 DIAGNOSIS — N930 Postcoital and contact bleeding: Secondary | ICD-10-CM

## 2022-04-28 MED ORDER — NORGESTIMATE-ETH ESTRADIOL 0.25-35 MG-MCG PO TABS: 1.0000 | ORAL_TABLET | Freq: Every day | ORAL | 2 refills | Status: DC

## 2022-05-18 ENCOUNTER — Ambulatory Visit: Payer: Medicaid Other

## 2022-05-19 ENCOUNTER — Ambulatory Visit (INDEPENDENT_AMBULATORY_CARE_PROVIDER_SITE_OTHER): Payer: Medicaid Other

## 2022-05-19 VITALS — BP 116/75 | HR 66 | Ht 64.0 in | Wt 152.2 lb

## 2022-05-19 DIAGNOSIS — Z3042 Encounter for surveillance of injectable contraceptive: Secondary | ICD-10-CM

## 2022-05-19 MED ORDER — MEDROXYPROGESTERONE ACETATE 150 MG/ML IM SUSP
150.0000 mg | Freq: Once | INTRAMUSCULAR | Status: AC
  Administered 2022-05-19: 150 mg via INTRAMUSCULAR

## 2022-05-19 NOTE — Progress Notes (Signed)
Patient presents for DEPO injection today.  Last injection given: 02/28/22 Injection given today: 05/19/22 in RD Next injection due: Nov 3rd-17th, 2023.  Patient tolerated injection well. States that the break through bleeding has improved. Has a follow-up appointment with Dr. Jolayne Panther on 06/22/22. No further questions or concerns at this time.

## 2022-05-19 NOTE — Progress Notes (Signed)
Agree with nurses's documentation of this patient's clinic encounter.  Azreal Stthomas L, MD  

## 2022-06-22 ENCOUNTER — Ambulatory Visit: Payer: Medicaid Other | Admitting: Obstetrics and Gynecology

## 2022-07-24 ENCOUNTER — Encounter: Payer: Self-pay | Admitting: *Deleted

## 2022-08-07 ENCOUNTER — Other Ambulatory Visit: Payer: Self-pay | Admitting: Obstetrics and Gynecology

## 2022-08-07 DIAGNOSIS — Z3042 Encounter for surveillance of injectable contraceptive: Secondary | ICD-10-CM

## 2022-08-11 ENCOUNTER — Ambulatory Visit (INDEPENDENT_AMBULATORY_CARE_PROVIDER_SITE_OTHER): Payer: Medicaid Other | Admitting: *Deleted

## 2022-08-11 VITALS — BP 137/78 | HR 54 | Wt 158.0 lb

## 2022-08-11 DIAGNOSIS — Z3042 Encounter for surveillance of injectable contraceptive: Secondary | ICD-10-CM

## 2022-08-11 MED ORDER — MEDROXYPROGESTERONE ACETATE 150 MG/ML IM SUSP
150.0000 mg | Freq: Once | INTRAMUSCULAR | Status: AC
  Administered 2022-08-11: 150 mg via INTRAMUSCULAR

## 2022-08-11 NOTE — Progress Notes (Cosign Needed Addendum)
Date last pap: 10/26/20. Will schedule annual for January once schedule open. Last Depo-Provera: 05/19/22. Side Effects if any: NA. Serum HCG indicated? NA. Depo-Provera 150 mg IM given by: Selena Batten. Rolley Sims, RNC in RD. Next appointment due 10/27/22-11/10/22.

## 2022-08-12 NOTE — Progress Notes (Signed)
Patient was assessed and managed by nursing staff during this encounter. I have reviewed the chart and agree with the documentation and plan. I have also made any necessary editorial changes.  Jasman Pfeifle A Geno Sydnor, MD 08/12/2022 10:54 AM   

## 2022-10-30 ENCOUNTER — Ambulatory Visit (INDEPENDENT_AMBULATORY_CARE_PROVIDER_SITE_OTHER): Payer: Medicaid Other | Admitting: Emergency Medicine

## 2022-10-30 VITALS — BP 116/75 | HR 85 | Wt 164.3 lb

## 2022-10-30 DIAGNOSIS — Z3042 Encounter for surveillance of injectable contraceptive: Secondary | ICD-10-CM

## 2022-10-30 MED ORDER — MEDROXYPROGESTERONE ACETATE 150 MG/ML IM SUSP: 150.0000 mg | INTRAMUSCULAR | 0 refills | Status: DC

## 2022-10-30 MED ORDER — MEDROXYPROGESTERONE ACETATE 150 MG/ML IM SUSP
150.0000 mg | Freq: Once | INTRAMUSCULAR | Status: AC
  Administered 2022-10-30: 150 mg via INTRAMUSCULAR

## 2022-10-30 NOTE — Progress Notes (Signed)
Date last pap: 10/26/2020. Last Depo-Provera: 08/11/2022. Side Effects if any: None. Serum HCG indicated? N/A. Depo-Provera 150 mg IM given by: Corinna Lines into Right deltoid, tolerated well. Next appointment due: April 16-30.

## 2023-01-12 IMAGING — CT CT ABD-PELV W/O CM
2 of 4 series · 16 of 46 positions shown, 18 images · non-contrast
Comparison: Contrast-enhanced CT 05/22/2020

CLINICAL DATA: Fever and abdominal pain. Vomiting. COVID positive
today.

EXAM:
CT ABDOMEN AND PELVIS WITHOUT CONTRAST
TECHNIQUE: Multidetector CT imaging of the abdomen and pelvis was performed
following the standard protocol without IV contrast.

[Series 2: abd pel wo · axial · 0.74mm/px · z∈[+755,+1175]mm · 13 of 92 slices shown, 15 images]
[im 4/92  soft-tissue]
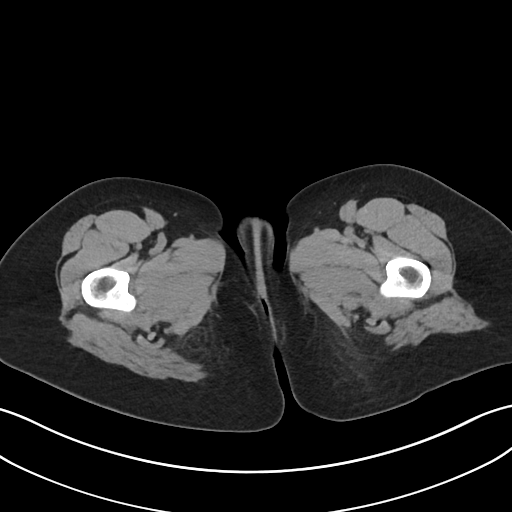
[im 4/92  bone]
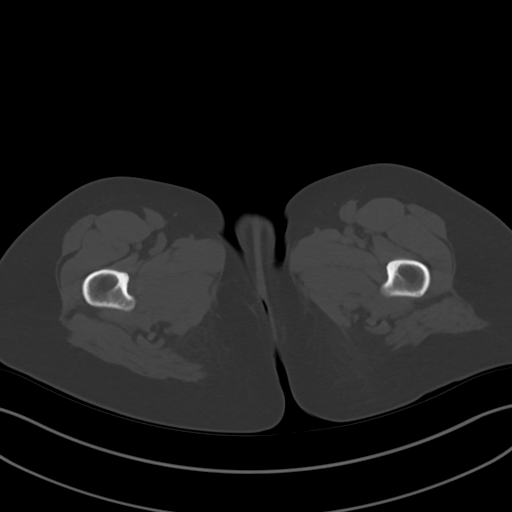
[im 11/92  soft-tissue]
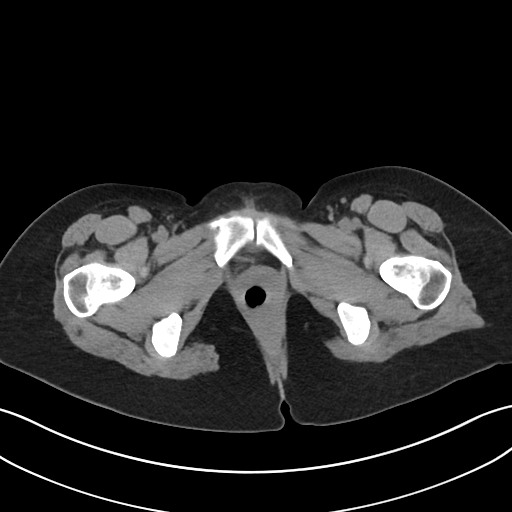
[im 19/92  soft-tissue]
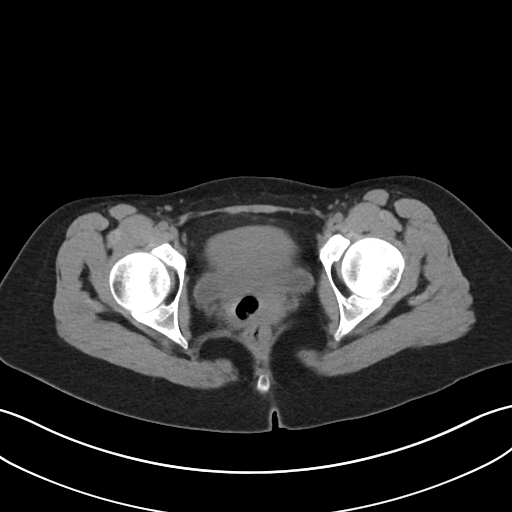
[im 26/92  soft-tissue]
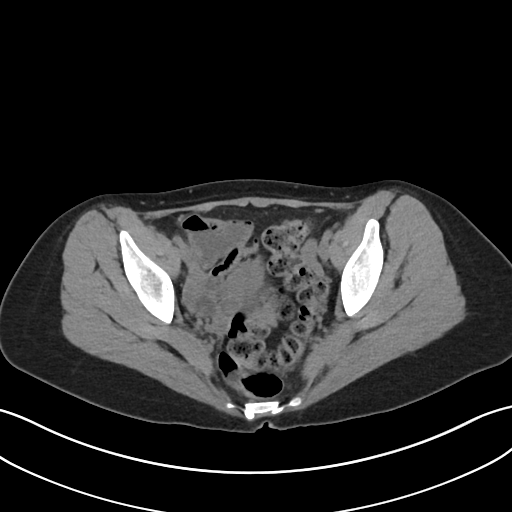
[im 33/92  soft-tissue]
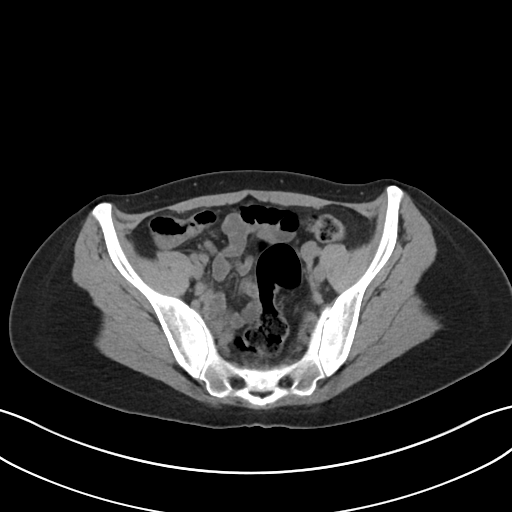
[im 41/92  soft-tissue]
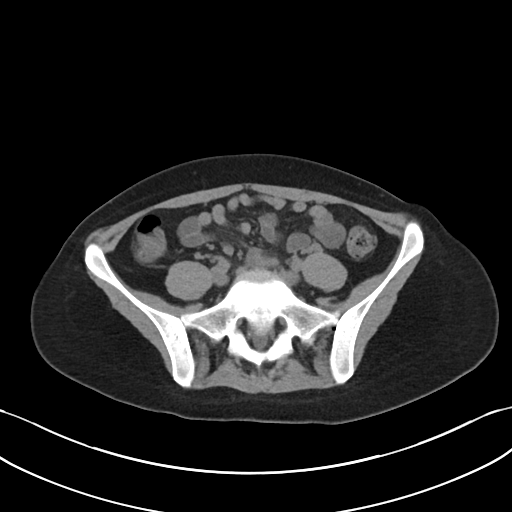
[im 48/92  soft-tissue]
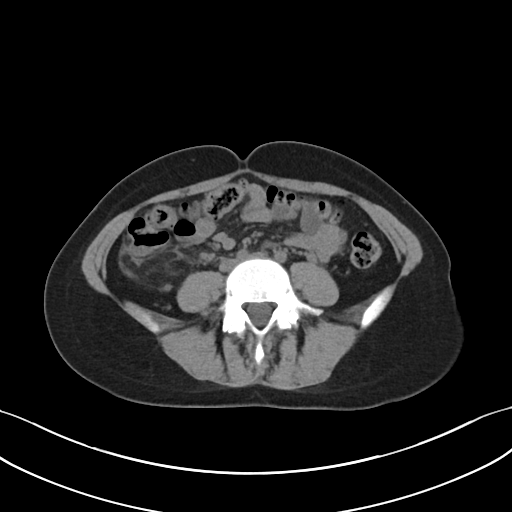
[im 51/92  soft-tissue]
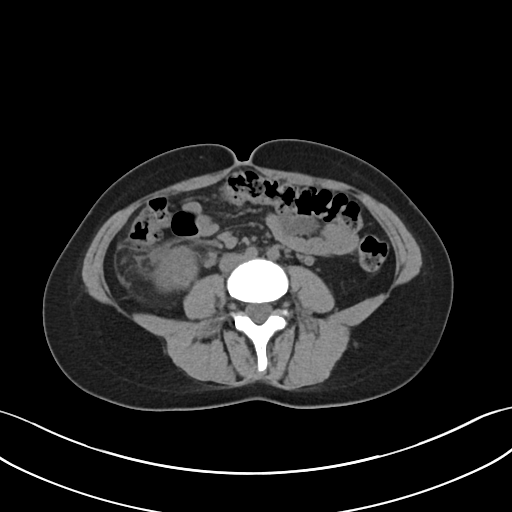
[im 59/92  soft-tissue]
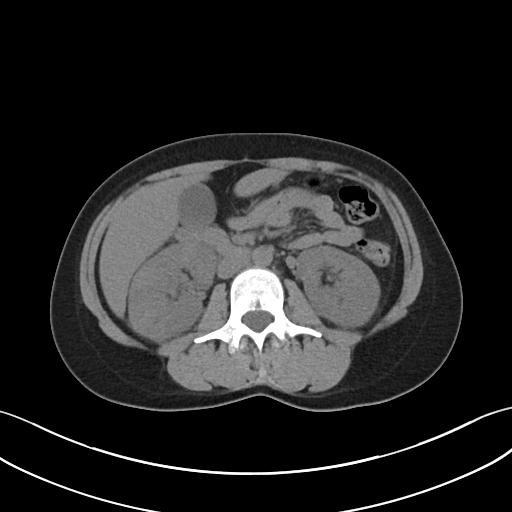
[im 59/92  bone]
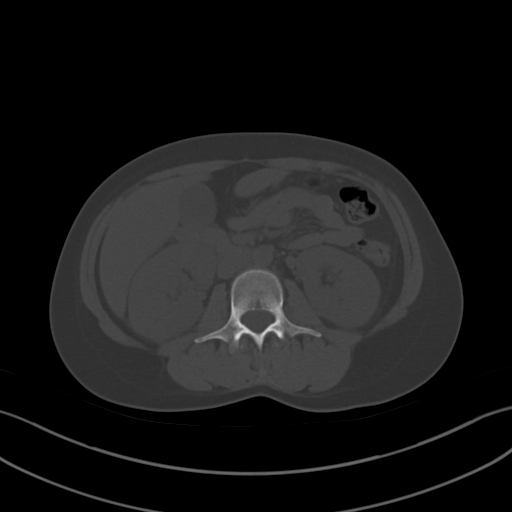
[im 66/92  soft-tissue]
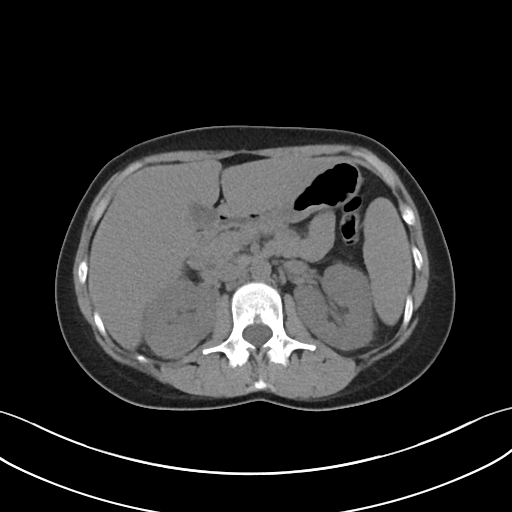
[im 73/92  soft-tissue]
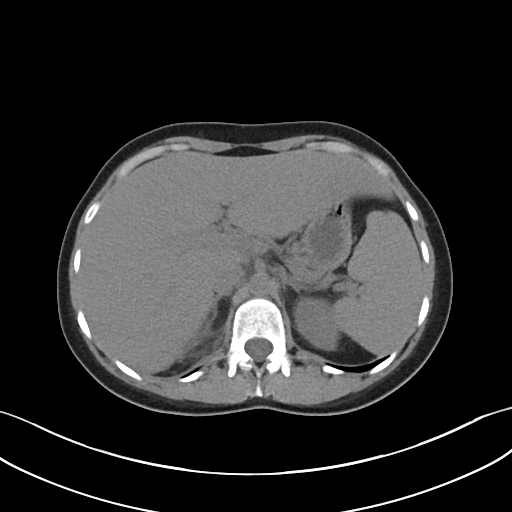
[im 81/92  soft-tissue]
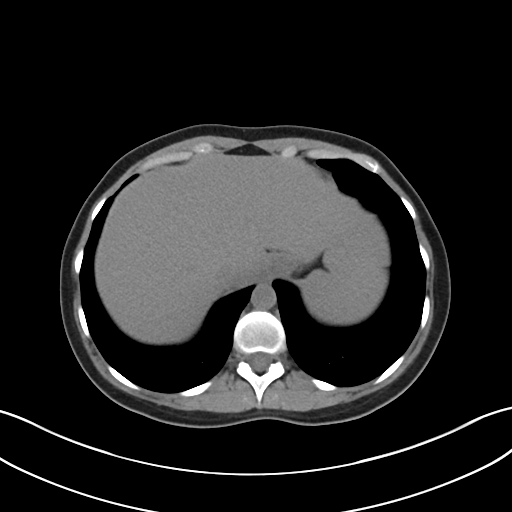
[im 88/92  soft-tissue]
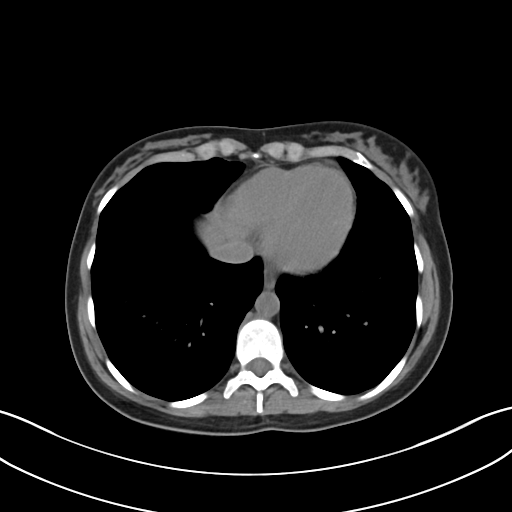

[Series 5: coronal · coronal · 0.77mm/px · 3 of 80 slices shown]
[im 27/80  soft-tissue]
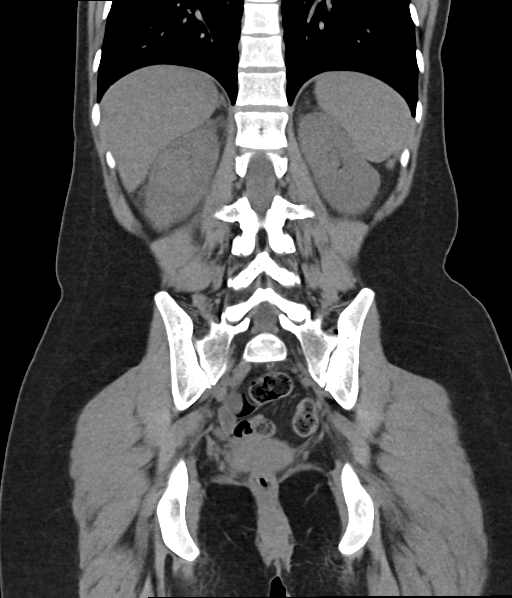
[im 36/80  soft-tissue]
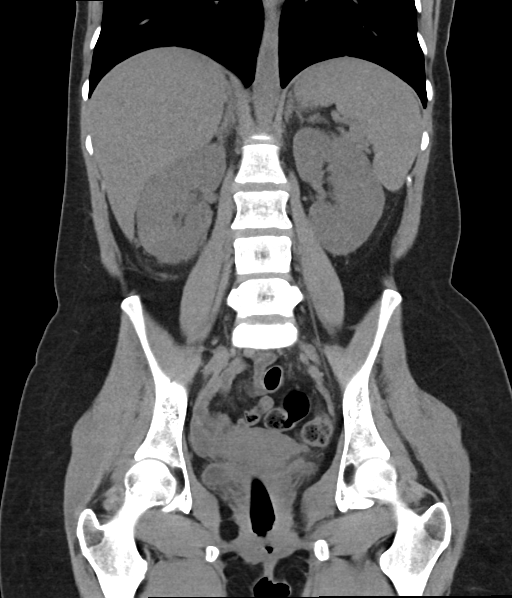
[im 44/80  soft-tissue]
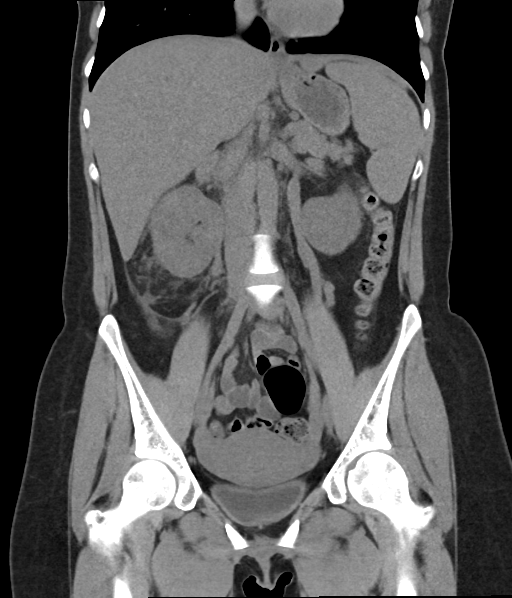

[16 of 46 positions shown; findings below may reference images not displayed]

FINDINGS: Lower chest: The lung bases are clear.

Hepatobiliary: Minimal focal fatty infiltration adjacent to the
falciform ligament. No evidence of focal liver lesion. Unremarkable
gallbladder. No biliary dilatation.

Pancreas: No ductal dilatation or inflammation.

Spleen: Normal in size without focal abnormality.

Adrenals/Urinary Tract: Moderate right perinephric edema with
edematous appearance of the right kidney. No hydronephrosis. No
renal or ureteral calculi. Unremarkable noncontrast appearance of
the left kidney. No left renal stones. Urinary bladder is minimally
distended and not well assessed. No bladder stone. No adrenal
nodule.

Stomach/Bowel: Stomach is decompressed. There is no small bowel
obstruction or inflammation. Normal appendix. Small to moderate
colonic stool burden. No colonic wall thickening or inflammation.

Vascular/Lymphatic: Normal caliber abdominal aorta. No portal venous
or mesenteric gas. There are prominent retroperitoneal lymph nodes
are likely reactive.

Reproductive: Unremarkable noncontrast CT appearance of the uterus.
Tampon in the vagina. Quiescent ovaries.

Other: No free air or free fluid.

Musculoskeletal: There are no acute or suspicious osseous
abnormalities.
IMPRESSION: 1. Moderate right perinephric edema with edematous appearance of the
right kidney, suspicious for pyelonephritis. No renal stones or
obstructive uropathy.
2. Prominent retroperitoneal lymph nodes are likely reactive.

## 2023-01-12 IMAGING — DX DG CHEST 1V PORT
1 series · 1 of 1 positions shown · non-contrast
Comparison: None.

CLINICAL DATA: Sepsis.  Fever.  Abdominal pain and vomiting.

EXAM:
PORTABLE CHEST 1 VIEW

[chest]
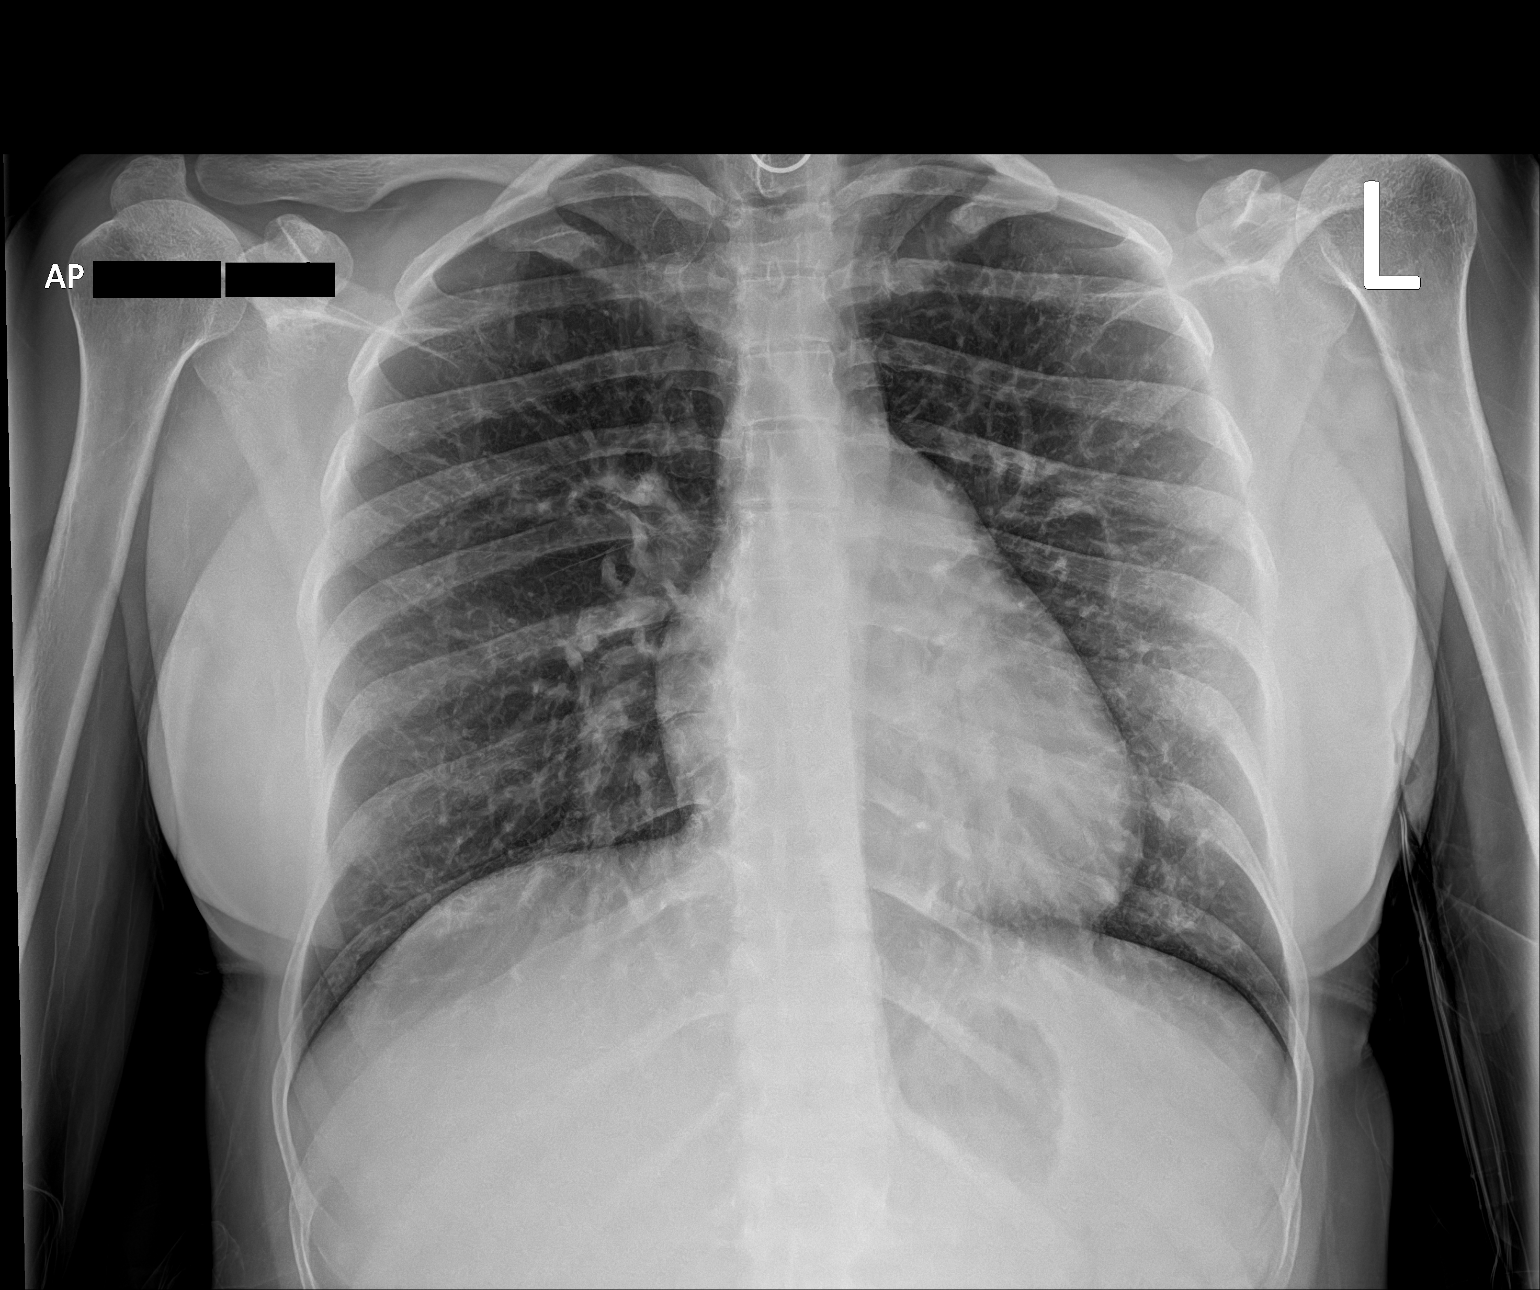

[1 of 1 positions shown; findings below may reference images not displayed]

FINDINGS: The cardiomediastinal contours are normal. The lungs are clear.
Pulmonary vasculature is normal. No consolidation, pleural effusion,
or pneumothorax. No acute osseous abnormalities are seen.
IMPRESSION: Negative AP view of the chest.

## 2023-01-22 ENCOUNTER — Ambulatory Visit (INDEPENDENT_AMBULATORY_CARE_PROVIDER_SITE_OTHER): Payer: Self-pay

## 2023-01-22 VITALS — BP 115/77 | HR 85 | Ht 64.0 in | Wt 176.0 lb

## 2023-01-22 DIAGNOSIS — Z3042 Encounter for surveillance of injectable contraceptive: Secondary | ICD-10-CM

## 2023-01-22 MED ORDER — MEDROXYPROGESTERONE ACETATE 150 MG/ML IM SUSP
150.0000 mg | Freq: Once | INTRAMUSCULAR | Status: AC
  Administered 2023-01-22: 150 mg via INTRAMUSCULAR

## 2023-01-22 NOTE — Progress Notes (Addendum)
Date last PAP: 10/26/2020. Last Depo-Provera: 10/30/22. Side Effects if any: none. Serum HCG indicated? NA.  Depo-Provera 150 mg IM given by: Lum Babe, into LD, tolerated well.  Next appointment due: Next DEPO July 8-22, 2024   Administrations This Visit     medroxyPROGESTERone (DEPO-PROVERA) injection 150 mg     Admin Date 01/22/2023 Action Given Dose 150 mg Route Intramuscular Administered By Maretta Bees, RMA

## 2023-04-10 ENCOUNTER — Encounter: Payer: Self-pay | Admitting: Certified Nurse Midwife

## 2023-04-10 ENCOUNTER — Ambulatory Visit (INDEPENDENT_AMBULATORY_CARE_PROVIDER_SITE_OTHER): Payer: Medicaid Other | Admitting: Certified Nurse Midwife

## 2023-04-10 ENCOUNTER — Ambulatory Visit: Payer: Medicaid Other

## 2023-04-10 VITALS — BP 129/81 | HR 86 | Ht 64.0 in | Wt 173.2 lb

## 2023-04-10 DIAGNOSIS — Z113 Encounter for screening for infections with a predominantly sexual mode of transmission: Secondary | ICD-10-CM

## 2023-04-10 DIAGNOSIS — Z3042 Encounter for surveillance of injectable contraceptive: Secondary | ICD-10-CM | POA: Diagnosis not present

## 2023-04-10 DIAGNOSIS — Z3009 Encounter for other general counseling and advice on contraception: Secondary | ICD-10-CM

## 2023-04-10 DIAGNOSIS — Z01419 Encounter for gynecological examination (general) (routine) without abnormal findings: Secondary | ICD-10-CM

## 2023-04-10 MED ORDER — MEDROXYPROGESTERONE ACETATE 150 MG/ML IM SUSP
150.0000 mg | Freq: Once | INTRAMUSCULAR | Status: AC
  Administered 2023-04-10: 150 mg via INTRAMUSCULAR

## 2023-04-10 MED ORDER — MEDROXYPROGESTERONE ACETATE 150 MG/ML IM SUSP: 150.0000 mg | INTRAMUSCULAR | 3 refills | Status: DC

## 2023-04-10 NOTE — Progress Notes (Unsigned)
Pt presents for AEX and Depo injection. Declines PAP and STD testing.  Date last pap: 10-26-20. Last Depo-Provera: 01-22-23. Side Effects if any: NA. Serum HCG indicated? NA. Depo-Provera 150 mg IM given by: Hope Pigeon in RD per pt request. Next appointment due 9/24-10/8.

## 2023-04-12 ENCOUNTER — Other Ambulatory Visit: Payer: Self-pay | Admitting: Certified Nurse Midwife

## 2023-04-12 MED ORDER — DEPO-SUBQ PROVERA 104 104 MG/0.65ML ~~LOC~~ SUSY: 104.0000 mg | PREFILLED_SYRINGE | SUBCUTANEOUS | 10 refills | Status: DC

## 2023-04-12 MED ORDER — MEDROXYPROGESTERONE ACETATE 150 MG/ML IM SUSP: 150.0000 mg | INTRAMUSCULAR | 3 refills | Status: AC

## 2023-04-12 NOTE — Progress Notes (Signed)
History:  Danielle Carter is a 27 y.o. [phone] who presents to clinic today for well woman exam and Depo follow up.    The following portions of the patient's history were reviewed and updated as appropriate: allergies, current medications, family history, past medical history, social history, past surgical history and problem list.  Review of Systems:  Review of Systems  Constitutional:  Negative for chills and fever.  Respiratory:  Negative for cough, shortness of breath and wheezing.   Cardiovascular:  Negative for chest pain and palpitations.  Gastrointestinal:  Negative for abdominal pain, constipation, diarrhea, nausea and vomiting.  Genitourinary:  Negative for dysuria, frequency and urgency.  Musculoskeletal:  Negative for back pain.  Neurological:  Negative for dizziness, tingling, weakness and headaches.  Psychiatric/Behavioral:  Negative for suicidal ideas.       Objective:  Physical Exam BP 129/81   Pulse 86   Ht 5\' 4"  (1.626 m)   Wt 173 lb 3.2 oz (78.6 kg)   BMI 29.73 kg/m  Physical Exam Constitutional:      General: She is not in acute distress.    Appearance: Normal appearance.  HENT:     Head: Normocephalic.  Cardiovascular:     Rate and Rhythm: Normal rate.  Pulmonary:     Effort: Pulmonary effort is normal. No respiratory distress.  Genitourinary:    Vagina: No vaginal discharge.  Musculoskeletal:        General: Normal range of motion.     Cervical back: Normal range of motion.  Skin:    General: Skin is warm and dry.  Neurological:     Mental Status: She is alert and oriented to person, place, and time.  Psychiatric:        Mood and Affect: Mood normal.        Behavior: Behavior normal.      Labs and Imaging No results found for this or any previous visit (from the past 24 hour(s)).  No results found.   Assessment & Plan:  1. Well woman exam - Patient doing well.   2. Birth control counseling - Reviewed that bleeding and cramping  improved significantly with depo and would like to continue.    3. Depo-Provera contraceptive status - Depo given at today visit.  - Patient desires for self admin at home of future Depo. CNM agreeable to plan of care. Reviewed requirements for self-admin injections and when to call office to notify of administration.  - Patient agreeable to plan of care.    Jazmen Lindenbaum Danella Deis) Suzie Portela, MSN, CNM  [facility]  04/12/2023 3:49 PM

## 2023-06-20 ENCOUNTER — Other Ambulatory Visit: Payer: Self-pay

## 2023-09-20 ENCOUNTER — Telehealth: Payer: Self-pay | Admitting: *Deleted

## 2023-09-20 NOTE — Telephone Encounter (Signed)
Message from admin staff of pt call:  Pt called and stated she gave herself her depo shot on 07/01/23 in the left arm and 09/19/23 in the right arm.

## 2024-02-15 ENCOUNTER — Other Ambulatory Visit: Payer: Self-pay

## 2024-02-15 NOTE — Progress Notes (Signed)
 Received message dated 02/14/24 that patient called and stated that she self administered depo in her Left arm

## 2024-04-14 ENCOUNTER — Ambulatory Visit: Admitting: Obstetrics and Gynecology

## 2024-04-14 ENCOUNTER — Other Ambulatory Visit (HOSPITAL_COMMUNITY)
Admission: RE | Admit: 2024-04-14 | Discharge: 2024-04-14 | Disposition: A | Discharge status: Home / Self Care | Source: Ambulatory Visit | Attending: Obstetrics and Gynecology | Admitting: Obstetrics and Gynecology

## 2024-04-14 ENCOUNTER — Encounter: Payer: Self-pay | Admitting: Obstetrics and Gynecology

## 2024-04-14 VITALS — BP 134/85 | HR 94 | Ht 64.0 in | Wt 178.0 lb

## 2024-04-14 DIAGNOSIS — Z01419 Encounter for gynecological examination (general) (routine) without abnormal findings: Secondary | ICD-10-CM | POA: Diagnosis present

## 2024-04-14 DIAGNOSIS — Z1331 Encounter for screening for depression: Secondary | ICD-10-CM

## 2024-04-14 MED ORDER — MEDROXYPROGESTERONE ACETATE 150 MG/ML IM SUSP: 150.0000 mg | INTRAMUSCULAR | 4 refills | Status: AC

## 2024-04-14 NOTE — Progress Notes (Signed)
 Subjective:     Danielle Carter is a 28 y.o. female P3 with amenorrhea secondary to depo-provera  and BMI 30 who is here for a comprehensive physical exam. The patient reports no problems. She is sexually active without complaints. She denies pelvic pain or abnormal discharge. She denies urinary incontinence and reports regular bowel movements. Patient desires to continue with depo-provera   Past Medical History:  Diagnosis Date   Anemia    Anxiety    Depression    going well   Headache    'occular'   Infection    UTI   PTSD (post-traumatic stress disorder)    Vaginal Pap smear, abnormal    ok since   Past Surgical History:  Procedure Laterality Date   DRUG INDUCED ENDOSCOPY     Family History  Problem Relation Age of Onset   Fibromyalgia Mother    Seizures Mother    Pancreatitis Father    Graves' disease Father    Depression Father     Social History   Socioeconomic History   Marital status: Significant Other    Spouse name: Olita Slay   Number of children: 1   Years of education: Not on file   Highest education level: Not on file  Occupational History   Not on file  Tobacco Use   Smoking status: Former    Current packs/day: 0.00    Types: Cigarettes    Quit date: 12/10/2018    Years since quitting: 5.3   Smokeless tobacco: Never  Vaping Use   Vaping status: Some Days  Substance and Sexual Activity   Alcohol use: Never   Drug use: Never   Sexual activity: Yes    Birth control/protection: None  Other Topics Concern   Not on file  Social History Narrative   Not on file   Social Drivers of Health   Financial Resource Strain: Not on file  Food Insecurity: No Food Insecurity (10/11/2021)   Received from [facility]   Hunger Vital Sign    Within the past 12 months, you worried that your food would run out before you got the money to buy more.: Never true    Within the past 12 months, the food you bought just didn't last and you didn't have money to get  more.: Never true  Transportation Needs: Not on file  Physical Activity: Not on file  Stress: Not on file  Social Connections: Unknown (02/12/2022)   Received from [facility]   Social Network    Social Network: Not on file  Intimate Partner Violence: Unknown (01/04/2022)   Received from [facility]   HITS    Physically Hurt: Not on file    Insult or Talk Down To: Not on file    Threaten Physical Harm: Not on file    Scream or Curse: Not on file   Health Maintenance  Topic Date Due   HPV VACCINES (1 - 3-dose series) Never done   COVID-19 Vaccine (1 - 2024-25 season) Never done   Cervical Cancer Screening (Pap smear)  10/27/2023   INFLUENZA VACCINE  05/02/2024   DTaP/Tdap/Td (3 - Td or Tdap) 01/21/2031   Hepatitis B Vaccines  Completed   Hepatitis C Screening  Completed   HIV Screening  Completed   Pneumococcal Vaccine 81-60 Years old  Aged Out   Meningococcal B Vaccine  Aged Out       Review of Systems Pertinent items noted in HPI and remainder of comprehensive ROS otherwise negative.  Objective:  Blood pressure 134/85, pulse 94, height 5' 4 (1.626 m), weight 178 lb (80.7 kg).   GENERAL: Well-developed, well-nourished female in no acute distress.  HEENT: Normocephalic, atraumatic. Sclerae anicteric.  NECK: Supple. Normal thyroid.  LUNGS: Clear to auscultation bilaterally.  HEART: Regular rate and rhythm. BREASTS: Symmetric in size. No palpable masses or lymphadenopathy, skin changes, or nipple drainage. ABDOMEN: Soft, nontender, nondistended. No organomegaly. PELVIC: Normal external female genitalia. Vagina is pink and rugated.  Normal discharge. Normal appearing cervix. Uterus is normal in size. No adnexal mass or tenderness. Chaperone present during the pelvic exam EXTREMITIES: No cyanosis, clubbing, or edema, 2+ distal pulses.     Assessment:    Healthy female exam.      Plan:    Pap smear collected STI screening per patient request- vaginal swab  only Patient will be contacted with abnormal results Continue with depo-provera - refill provided See After Visit Summary for Counseling Recommendations

## 2024-04-15 LAB — CERVICOVAGINAL ANCILLARY ONLY
Chlamydia: NEGATIVE
Comment: NEGATIVE
Comment: NORMAL
Neisseria Gonorrhea: NEGATIVE

## 2024-04-17 LAB — CYTOLOGY - PAP
Comment: NEGATIVE
Diagnosis: UNDETERMINED — AB
High risk HPV: NEGATIVE

## 2024-04-18 ENCOUNTER — Ambulatory Visit: Payer: Self-pay | Admitting: Obstetrics and Gynecology
